# Patient Record
Sex: Female | Born: 1950
Health system: Southern US, Community
[De-identification: ages and names within clinical notes are randomized; demographics above are authoritative.]

## PROBLEM LIST (undated history)

## (undated) DIAGNOSIS — R519 Headache, unspecified: Secondary | ICD-10-CM

## (undated) DIAGNOSIS — R7303 Prediabetes: Secondary | ICD-10-CM

## (undated) DIAGNOSIS — H18609 Keratoconus, unspecified, unspecified eye: Secondary | ICD-10-CM

## (undated) DIAGNOSIS — J45909 Unspecified asthma, uncomplicated: Secondary | ICD-10-CM

## (undated) DIAGNOSIS — M1990 Unspecified osteoarthritis, unspecified site: Secondary | ICD-10-CM

## (undated) DIAGNOSIS — T7840XA Allergy, unspecified, initial encounter: Secondary | ICD-10-CM

## (undated) DIAGNOSIS — I10 Essential (primary) hypertension: Secondary | ICD-10-CM

## (undated) DIAGNOSIS — E785 Hyperlipidemia, unspecified: Secondary | ICD-10-CM

## (undated) DIAGNOSIS — H269 Unspecified cataract: Secondary | ICD-10-CM

## (undated) DIAGNOSIS — M810 Age-related osteoporosis without current pathological fracture: Secondary | ICD-10-CM

## (undated) HISTORY — DX: Age-related osteoporosis without current pathological fracture: M81.0

## (undated) HISTORY — PX: BREAST EXCISIONAL BIOPSY: SUR124

## (undated) HISTORY — PX: BACK SURGERY: SHX140

## (undated) HISTORY — DX: Hyperlipidemia, unspecified: E78.5

## (undated) HISTORY — DX: Unspecified cataract: H26.9

## (undated) HISTORY — PX: COLONOSCOPY W/ POLYPECTOMY: SHX1380

## (undated) HISTORY — DX: Allergy, unspecified, initial encounter: T78.40XA

## (undated) HISTORY — DX: Keratoconus, unspecified, unspecified eye: H18.609

## (undated) HISTORY — PX: EYE SURGERY: SHX253

## (undated) HISTORY — PX: OTHER SURGICAL HISTORY: SHX169

## (undated) HISTORY — DX: Essential (primary) hypertension: I10

## (undated) HISTORY — DX: Unspecified asthma, uncomplicated: J45.909

## (undated) HISTORY — PX: BREAST SURGERY: SHX581

## (undated) HISTORY — PX: CATARACT EXTRACTION: SUR2

---

## 1993-06-11 HISTORY — PX: ABDOMINAL HYSTERECTOMY: SHX81

## 1999-04-10 ENCOUNTER — Other Ambulatory Visit: Admission: RE | Admit: 1999-04-10 | Discharge: 1999-04-10 | Payer: Self-pay | Admitting: Obstetrics and Gynecology

## 2000-01-04 ENCOUNTER — Encounter: Admission: RE | Admit: 2000-01-04 | Discharge: 2000-01-04 | Payer: Self-pay | Admitting: General Surgery

## 2000-01-04 ENCOUNTER — Encounter: Payer: Self-pay | Admitting: General Surgery

## 2000-01-10 ENCOUNTER — Ambulatory Visit (HOSPITAL_COMMUNITY): Admission: RE | Admit: 2000-01-10 | Discharge: 2000-01-10 | Payer: Self-pay | Admitting: General Surgery

## 2000-01-10 ENCOUNTER — Encounter: Payer: Self-pay | Admitting: General Surgery

## 2000-01-31 ENCOUNTER — Ambulatory Visit (HOSPITAL_COMMUNITY): Admission: RE | Admit: 2000-01-31 | Discharge: 2000-01-31 | Payer: Self-pay | Admitting: Obstetrics and Gynecology

## 2000-01-31 ENCOUNTER — Encounter: Payer: Self-pay | Admitting: Obstetrics and Gynecology

## 2000-06-10 ENCOUNTER — Encounter: Payer: Self-pay | Admitting: Obstetrics and Gynecology

## 2000-06-10 ENCOUNTER — Encounter: Admission: RE | Admit: 2000-06-10 | Discharge: 2000-06-10 | Payer: Self-pay | Admitting: Obstetrics and Gynecology

## 2000-06-10 ENCOUNTER — Other Ambulatory Visit: Admission: RE | Admit: 2000-06-10 | Discharge: 2000-06-10 | Payer: Self-pay | Admitting: Obstetrics and Gynecology

## 2001-12-30 ENCOUNTER — Other Ambulatory Visit: Admission: RE | Admit: 2001-12-30 | Discharge: 2001-12-30 | Payer: Self-pay | Admitting: Obstetrics and Gynecology

## 2003-04-09 ENCOUNTER — Ambulatory Visit (HOSPITAL_BASED_OUTPATIENT_CLINIC_OR_DEPARTMENT_OTHER): Admission: RE | Admit: 2003-04-09 | Discharge: 2003-04-09 | Payer: Self-pay | Admitting: Podiatry

## 2003-04-09 ENCOUNTER — Ambulatory Visit (HOSPITAL_COMMUNITY): Admission: RE | Admit: 2003-04-09 | Discharge: 2003-04-09 | Payer: Self-pay | Admitting: Podiatry

## 2005-03-04 ENCOUNTER — Emergency Department (HOSPITAL_COMMUNITY): Admission: EM | Admit: 2005-03-04 | Discharge: 2005-03-04 | Payer: Self-pay | Admitting: Emergency Medicine

## 2005-03-05 ENCOUNTER — Emergency Department (HOSPITAL_COMMUNITY): Admission: EM | Admit: 2005-03-05 | Discharge: 2005-03-06 | Payer: Self-pay | Admitting: Emergency Medicine

## 2012-05-02 ENCOUNTER — Encounter (HOSPITAL_COMMUNITY): Payer: Self-pay | Admitting: *Deleted

## 2012-05-13 ENCOUNTER — Encounter (HOSPITAL_COMMUNITY): Payer: Self-pay

## 2012-05-13 ENCOUNTER — Ambulatory Visit (HOSPITAL_COMMUNITY)
Admission: RE | Admit: 2012-05-13 | Discharge: 2012-05-13 | Disposition: A | Discharge status: Home / Self Care | Payer: Self-pay | Source: Ambulatory Visit | Attending: Obstetrics and Gynecology | Admitting: Obstetrics and Gynecology

## 2012-05-13 VITALS — BP 124/76 | Temp 98.2°F | Ht 66.0 in | Wt 160.2 lb

## 2012-05-13 DIAGNOSIS — Z1239 Encounter for other screening for malignant neoplasm of breast: Secondary | ICD-10-CM

## 2012-05-13 NOTE — Progress Notes (Signed)
Complaints of left breast growth x 2 weeks.  Pap Smear:    Pap smear not performed today. Per patient last Pap smear was in 2008 and normal. Per patient she has no history of abnormal Pap smears. Patient has a history of a hysterectomy in 1995 for fibroids.  No Pap smear results in EPIC.  Physical exam: Breasts Breasts symmetrical. Multiple scars bilateral breasts from history of surgical removal of benign breast masses. No nipple retraction bilateral breasts. No nipple discharge bilateral breasts. No lymphadenopathy. No lumps palpated bilateral breasts. No complaints of pain or tenderness on exam. Observed small bump on left areola, which is patients area of concern. Patient referred to [facility] for diagnostic mammogram. Appointment scheduled for Monday, May 19, 2012 at 1400.         Pelvic/Bimanual No Pap smear completed today since patient has a history of a hysterectomy for benign reasons. Pap smear not indicated per BCCCP guidelines.

## 2012-05-13 NOTE — Patient Instructions (Signed)
Taught patient how to perform BSE and gave educational materials to take home. Patient did not need a Pap smear today due to history of a hysterectomy for benign reasons. Let patient know that she will not need any further Pap smears.  Patient referred to [facility] for diagnostic mammogram. Appointment scheduled for Monday, May 19, 2012 at 1400. Patient aware of appointment and will be there. Patient verbalized understanding.

## 2012-06-12 NOTE — Addendum Note (Signed)
Encounter addended by: Saintclair Halsted, RN on: 06/12/2012  4:47 PM<BR>     Documentation filed: Visit Diagnoses, Charges VN

## 2012-09-08 ENCOUNTER — Emergency Department (HOSPITAL_COMMUNITY)
Admission: EM | Admit: 2012-09-08 | Discharge: 2012-09-08 | Disposition: A | Discharge status: Home / Self Care | Payer: No Typology Code available for payment source | Source: Home / Self Care

## 2012-09-08 ENCOUNTER — Encounter (HOSPITAL_COMMUNITY): Payer: Self-pay

## 2012-09-08 DIAGNOSIS — L0291 Cutaneous abscess, unspecified: Secondary | ICD-10-CM

## 2012-09-08 DIAGNOSIS — L0390 Cellulitis, unspecified: Secondary | ICD-10-CM

## 2012-09-08 MED ORDER — DOXYCYCLINE HYCLATE 50 MG PO CAPS: 50.0000 mg | ORAL_CAPSULE | Freq: Two times a day (BID) | ORAL | Status: DC

## 2012-09-08 NOTE — ED Notes (Signed)
Patient here to establish care Needs referral to dental

## 2012-09-08 NOTE — ED Provider Notes (Signed)
History     CSN: [id number]  Arrival date & time 09/08/12  1318   First MD Initiated Contact with Patient 09/08/12 1331      Chief Complaint  Patient presents with  . Establish Care    (Consider location/radiation/quality/duration/timing/severity/associated sxs/prior treatment) HPI Patient is 62 year old female who presents for regular followup appointment. She explains she has a small abscess formed above left upper molar and would like referral to dentist. She denies chest pain or shortness of breath, no recent sicknesses or hospitalizations, no specific abdominal or urinary concerns. She would also like a referral to GI specialist as she has not had a colonoscopy done.  History reviewed. No pertinent past medical history.  Past Surgical History  Procedure Laterality Date  . Breast surgery      breast biopies  . Abdominal hysterectomy      Family History  Problem Relation Age of Onset  . Hypertension Sister   . Heart disease Sister     History  Substance Use Topics  . Smoking status: Never Smoker   . Smokeless tobacco: Never Used  . Alcohol Use: No    OB History   Grav Para Term Preterm Abortions TAB SAB Ect Mult Living   0         0      Review of Systems  Constitutional: Negative for fever, chills, diaphoresis, activity change, appetite change and fatigue.  HENT: Negative for ear pain, nosebleeds, congestion, facial swelling, rhinorrhea, neck pain, neck stiffness and ear discharge.   Eyes: Negative for pain, discharge, redness, itching and visual disturbance.  Respiratory: Negative for cough, choking, chest tightness, shortness of breath, wheezing and stridor.   Cardiovascular: Negative for chest pain, palpitations and leg swelling.  Gastrointestinal: Negative for abdominal distention.  Genitourinary: Negative for dysuria, urgency, frequency, hematuria, flank pain, decreased urine volume, difficulty urinating and dyspareunia.  Musculoskeletal: Negative for  back pain, joint swelling, arthralgias and gait problem.  Neurological: Negative for dizziness, tremors, seizures, syncope, facial asymmetry, speech difficulty, weakness, light-headedness, numbness and headaches.  Hematological: Negative for adenopathy. Does not bruise/bleed easily.  Psychiatric/Behavioral: Negative for hallucinations, behavioral problems, confusion, dysphoric mood, decreased concentration and agitation.    Allergies  Penicillins and Valium  Home Medications   Current Outpatient Rx  Name  Route  Sig  Dispense  Refill  . doxycycline (VIBRAMYCIN) 50 MG capsule   Oral   Take 1 capsule (50 mg total) by mouth 2 (two) times daily.   14 capsule   0     BP 135/73  Pulse 84  Temp(Src) 97.7 F (36.5 C) (Oral)  SpO2 100%  Physical Exam Physical Exam  Constitutional: Appears well-developed and well-nourished. No distress.  HENT: Normocephalic. External right and left ear normal. Oropharynx is clear and moist.  small abscess half a centimeter in size located above left upper molar, no pus noted, tender to palpation Eyes: Conjunctivae and EOM are normal. PERRLA, no scleral icterus.  Neck: Normal ROM. Neck supple. No JVD. No tracheal deviation. No thyromegaly.  CVS: RRR, S1/S2 +, no murmurs, no gallops, no carotid bruit.  Pulmonary: Effort and breath sounds normal, no stridor, rhonchi, wheezes, rales.  Abdominal: Soft. BS +,  no distension, tenderness, rebound or guarding.  Musculoskeletal: Normal range of motion. No edema and no tenderness.  Lymphadenopathy: No lymphadenopathy noted, cervical, inguinal. Neuro: Alert. Normal reflexes, muscle tone coordination. No cranial nerve deficit. Skin: Skin is warm and dry. No rash noted. Not diaphoretic. No erythema. No pallor.  Psychiatric: Normal mood and affect. Behavior, judgment, thought content normal.    ED Course  Procedures (including critical care time)  Labs Reviewed - No data to display No results found.   1.  Abscess   - will provide referral for dentist, also provide doxycycline for 7 days, apply warm compresses daily 2. GI referral for colonoscopy    MDM  Tooth abscess, doxycycline for 7 days and dentist referral        Dorothea Ogle, MD 09/08/12 [phone]

## 2012-09-08 NOTE — ED Notes (Signed)
Referral faxed to [facility] needs routine colonoscopy appt 10/24/12 @ 10:30

## 2012-09-09 ENCOUNTER — Encounter: Payer: Self-pay | Admitting: Gastroenterology

## 2012-09-15 NOTE — ED Notes (Signed)
Patient has an appointment 4/10 @ [facility]

## 2012-10-09 ENCOUNTER — Ambulatory Visit (AMBULATORY_SURGERY_CENTER): Payer: No Typology Code available for payment source | Admitting: *Deleted

## 2012-10-09 VITALS — Ht 66.5 in | Wt 155.6 lb

## 2012-10-09 DIAGNOSIS — Z1211 Encounter for screening for malignant neoplasm of colon: Secondary | ICD-10-CM

## 2012-10-09 MED ORDER — NA SULFATE-K SULFATE-MG SULF 17.5-3.13-1.6 GM/177ML PO SOLN: 1.0000 | Freq: Once | ORAL | Status: DC

## 2012-10-09 NOTE — Progress Notes (Signed)
Vegetarian x 25 years. ewm No egg or soy allergy. ewm No problems with sedation in the past. ewm

## 2012-10-23 ENCOUNTER — Encounter: Payer: Self-pay | Admitting: Gastroenterology

## 2012-10-23 ENCOUNTER — Ambulatory Visit (AMBULATORY_SURGERY_CENTER): Payer: No Typology Code available for payment source | Admitting: Gastroenterology

## 2012-10-23 VITALS — BP 133/69 | HR 64 | Temp 96.7°F | Resp 12 | Ht 66.0 in | Wt 155.0 lb

## 2012-10-23 DIAGNOSIS — D126 Benign neoplasm of colon, unspecified: Secondary | ICD-10-CM

## 2012-10-23 DIAGNOSIS — Z1211 Encounter for screening for malignant neoplasm of colon: Secondary | ICD-10-CM

## 2012-10-23 MED ORDER — SODIUM CHLORIDE 0.9 % IV SOLN: 500.0000 mL | INTRAVENOUS | Status: DC

## 2012-10-23 NOTE — Op Note (Signed)
[facility] [address] [place], [address]   COLONOSCOPY PROCEDURE REPORT  PATIENT: Nicole, Delgado  MR#: [id number] BIRTHDATE: Mar 14, 1951 , 62  yrs. old GENDER: Female ENDOSCOPIST: Louis Meckel, MD REFERRED BY: PROCEDURE DATE:  10/23/2012 PROCEDURE:   Colonoscopy with snare polypectomy ASA CLASS:   Class I INDICATIONS:average risk screening. MEDICATIONS: MAC sedation, administered by CRNA and propofol (Diprivan) 200mg  IV  DESCRIPTION OF PROCEDURE:   After the risks benefits and alternatives of the procedure were thoroughly explained, informed consent was obtained.  A digital rectal exam revealed no abnormalities of the rectum.   The LB [id number]  endoscope was introduced through the anus and advanced to the cecum, which was identified by both the appendix and ileocecal valve. No adverse events experienced.   The quality of the prep was excellent using Suprep  The instrument was then slowly withdrawn as the colon was fully examined.      COLON FINDINGS: A sessile polyp was found in the rectum.  A polypectomy was performed with a cold snare.  The resection was complete and the polyp tissue was completely retrieved.   The colon mucosa was otherwise normal.  Retroflexed views revealed no abnormalities. The time to cecum=3 minutes 07 seconds.  Withdrawal time=7 minutes 28 seconds.  The scope was withdrawn and the procedure completed. COMPLICATIONS: There were no complications.  ENDOSCOPIC IMPRESSION: 1.   Sessile polyp was found in the rectum; polypectomy was performed with a cold snare 2.   The colon mucosa was otherwise normal  RECOMMENDATIONS: If the polyp(s) removed today are proven to be adenomatous (pre-cancerous) polyps, you will need a repeat colonoscopy in 5 years.  Otherwise you should continue to follow colorectal cancer screening guidelines for "routine risk" patients with colonoscopy in 10 years.  You will receive a letter  within 1-2 weeks with the results of your biopsy as well as final recommendations.  Please call my office if you have not received a letter after 3 weeks.   eSigned:  Louis Meckel, MD 10/23/2012 10:36 AM   cc:

## 2012-10-23 NOTE — Patient Instructions (Addendum)
YOU HAD AN ENDOSCOPIC PROCEDURE TODAY AT THE [facility]: Refer to the procedure report that was given to you for any specific questions about what was found during the examination.  If the procedure report does not answer your questions, please call your gastroenterologist to clarify.  If you requested that your care partner not be given the details of your procedure findings, then the procedure report has been included in a sealed envelope for you to review at your convenience later.  YOU SHOULD EXPECT: Some feelings of bloating in the abdomen. Passage of more gas than usual.  Walking can help get rid of the air that was put into your GI tract during the procedure and reduce the bloating. If you had a lower endoscopy (such as a colonoscopy or flexible sigmoidoscopy) you may notice spotting of blood in your stool or on the toilet paper. If you underwent a bowel prep for your procedure, then you may not have a normal bowel movement for a few days.  DIET: Your first meal following the procedure should be a light meal and then it is ok to progress to your normal diet.  A half-sandwich or bowl of soup is an example of a good first meal.  Heavy or fried foods are harder to digest and may make you feel nauseous or bloated.  Likewise meals heavy in dairy and vegetables can cause extra gas to form and this can also increase the bloating.  Drink plenty of fluids but you should avoid alcoholic beverages for 24 hours.  ACTIVITY: Your care partner should take you home directly after the procedure.  You should plan to take it easy, moving slowly for the rest of the day.  You can resume normal activity the day after the procedure however you should NOT DRIVE or use heavy machinery for 24 hours (because of the sedation medicines used during the test).    SYMPTOMS TO REPORT IMMEDIATELY: A gastroenterologist can be reached at any hour.  During normal business hours, 8:30 AM to 5:00 PM Monday through Friday,  call [phone].  After hours and on weekends, please call the GI answering service at [phone] who will take a message and have the physician on call con   Following lower endoscopy (colonoscopy or flexible sigmoidoscopy):  Excessive amounts of blood in the stool  Significant tenderness or worsening of abdominal pains  Swelling of the abdomen that is new, acute  Fever of 100F or higher  ls  FOLLOW UP: If any biopsies were taken you will be contacted by phone or by letter within the next 1-3 weeks.  Call your gastroenterologist if you have not heard about the biopsies in 3 weeks.  Our staff will call the home number listed on your records the next business day following your procedure to check on you and address any questions or concerns that you may have at that time regarding the information given to you following your procedure. This is a courtesy call and so if there is no answer at the home number and we have not heard from you through the emergency physician on call, we will assume that you have returned to your regular daily activities without incident.  SIGNATURES/CONFIDENTIALITY: You and/or your care partner have signed paperwork which will be entered into your electronic medical record.  These signatures attest to the fact that that the information above on your After Visit Summary has been reviewed and is understood.  Full responsibility of the confidentiality of  this discharge information lies with you and/or your care-partner.   Polyp information given.  Dr. Arlyce Dice will advise you about the timing of next colonoscopy after results are reviewed.

## 2012-10-23 NOTE — Progress Notes (Signed)
Patient did not experience any of the following events: a burn prior to discharge; a fall within the facility; wrong site/side/patient/procedure/implant event; or a hospital transfer or hospital admission upon discharge from the facility. (G8907) Patient did not have preoperative order for IV antibiotic SSI prophylaxis. (G8918)  

## 2012-10-24 ENCOUNTER — Telehealth: Payer: Self-pay | Admitting: *Deleted

## 2012-10-24 NOTE — Telephone Encounter (Signed)
  Follow up Call-  Call back number 10/23/2012  Post procedure Call Back phone  # [phone]  Permission to leave phone message Yes     Patient questions:  Do you have a fever, pain , or abdominal swelling? no Pain Score  0 *  Have you tolerated food without any problems? yes  Have you been able to return to your normal activities? yes  Do you have any questions about your discharge instructions: Diet   no Medications  no Follow up visit  no  Do you have questions or concerns about your Care? no  Actions: * If pain score is 4 or above: No action needed, pain <4.

## 2012-11-04 ENCOUNTER — Encounter: Payer: Self-pay | Admitting: Gastroenterology

## 2012-11-16 ENCOUNTER — Ambulatory Visit: Payer: Self-pay | Admitting: Physician Assistant

## 2012-11-16 VITALS — BP 129/73 | HR 82 | Temp 98.2°F | Resp 16 | Ht 65.5 in | Wt 155.0 lb

## 2012-11-16 DIAGNOSIS — N390 Urinary tract infection, site not specified: Secondary | ICD-10-CM

## 2012-11-16 DIAGNOSIS — R300 Dysuria: Secondary | ICD-10-CM

## 2012-11-16 LAB — POCT URINALYSIS DIPSTICK
Bilirubin, UA: NEGATIVE
Glucose, UA: NEGATIVE
Ketones, UA: NEGATIVE
Nitrite, UA: NEGATIVE
Protein, UA: NEGATIVE
Spec Grav, UA: 1.015
Urobilinogen, UA: 0.2
pH, UA: 5.5

## 2012-11-16 LAB — POCT UA - MICROSCOPIC ONLY
Casts, Ur, LPF, POC: NEGATIVE
Crystals, Ur, HPF, POC: NEGATIVE
Epithelial cells, urine per micros: NEGATIVE
Mucus, UA: NEGATIVE
Yeast, UA: NEGATIVE

## 2012-11-16 MED ORDER — CIPROFLOXACIN HCL 500 MG PO TABS: 500.0000 mg | ORAL_TABLET | Freq: Two times a day (BID) | ORAL | Status: DC

## 2012-11-16 MED ORDER — PHENAZOPYRIDINE HCL 200 MG PO TABS: 200.0000 mg | ORAL_TABLET | Freq: Three times a day (TID) | ORAL | Status: DC | PRN

## 2012-11-16 NOTE — Progress Notes (Signed)
Patient ID: Nicole Delgado MRN: [id number], DOB: 1950/12/17, 62 y.o. Date of Encounter: 11/16/2012, 6:30 PM  Primary Physician: No primary provider on file.  Chief Complaint: urinary frequency and dysuria  HPI: 62 y.o. year old female with presents with 3 day history of urinary frequency, dysuria, and feeling as if she is "peeing razor blades." Denies flank pain, nausea, vomiting, fever, chills, vaginal discharge, or odor. Has tried drinking cranberry juice which does seem to help slightly. Patient is postmenopausal. She is otherwise very health. Recently ran her first half marathon. 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   Patient is otherwise doing well without issues or complaints.  Past Medical History  Diagnosis Date  . Allergy      Home Meds: Prior to Admission medications   Medication Sig Start Date End Date Taking? Authorizing Provider  GLUCOSAMINE-CHONDROITIN PO Take 1 tablet by mouth 2 (two) times daily with a meal.   Yes Historical Provider, MD  ciprofloxacin (CIPRO) 500 MG tablet Take 1 tablet (500 mg total) by mouth 2 (two) times daily. 11/16/12   Melany Wiesman Jaquita Rector, PA-C  phenazopyridine (PYRIDIUM) 200 MG tablet Take 1 tablet (200 mg total) by mouth 3 (three) times daily as needed for pain. 11/16/12   Nelva Nay, PA-C    Allergies:  Allergies  Allergen Reactions  . Bee Venom Swelling  . Penicillins Hives  . Valium (Diazepam) Other (See Comments)    Hallucinations    History   Social History  . Marital Status: Divorced    Spouse Name: N/A    Number of Children: N/A  . Years of Education: N/A   Occupational History  . Not on file.   Social History Main Topics  . Smoking status: Never Smoker   . Smokeless tobacco: Never Used  . Alcohol Use: No  . Drug Use: No  . Sexually Active: Yes    Birth Control/ Protection: Surgical   Other Topics Concern  . Not on file   Social History Narrative  . No narrative on file     Review of Systems: Constitutional: negative  for chills, fever, night sweats, weight changes, or fatigue  HEENT: negative for vision changes, hearing loss, congestion, rhinorrhea, ST, epistaxis, or sinus pressure Cardiovascular: negative for chest pain or palpitations Respiratory: negative for cough, hemoptysis, wheezing, shortness of breath. Abdominal: negative for suprapubic abdominal pain. Positive for dysuria and frequency.   No nausea, vomiting, diarrhea, or constipation Genitourinary: positive for urinary frequency and dysuria. Negative for vaginal discharge, odor, or pelvic pain.  Dermatological: negative for rashes. Neurologic: negative for headache, dizziness, or syncope   Physical Exam: Blood pressure 129/73, pulse 82, temperature 98.2 F (36.8 C), temperature source Oral, resp. rate 16, height 5' 5.5" (1.664 m), weight 155 lb (70.308 kg), SpO2 98.00%., Body mass index is 25.39 kg/(m^2). General: Well developed, well nourished, in no acute distress. Head: Normocephalic, atraumatic, eyes without discharge, sclera non-icteric, nares are without discharge. External ear normal in appearance. Neck: Supple. No thyromegaly. Full ROM. No lymphadenopathy. Lungs: Clear bilaterally to auscultation without wheezes, rales, or rhonchi. Breathing is unlabored. Heart: RRR with S1 S2. No murmurs, rubs, or gallops appreciated. Abdominal: +BS x 4. No hepatosplenomegaly, rebound tenderness, or guarding. No suprapubic tenderness or CVA tenderness bilaterally.  Msk:  Strength and tone normal for age. Extremities/Skin: Warm and dry. No clubbing or cyanosis. No edema.  Neuro: Alert and oriented X 3. Moves all extremities spontaneously. Gait is normal. CNII-XII grossly in tact. Psych:  Responds to questions appropriately with a normal affect.   Labs: Results for orders placed in visit on 11/16/12  POCT URINALYSIS DIPSTICK      Result Value Range   Color, UA yellow     Clarity, UA cloudy     Glucose, UA neg     Bilirubin, UA neg     Ketones, UA  neg     Spec Grav, UA 1.015     Blood, UA large     pH, UA 5.5     Protein, UA neg     Urobilinogen, UA 0.2     Nitrite, UA neg     Leukocytes, UA large (3+)    POCT UA - MICROSCOPIC ONLY      Result Value Range   WBC, Ur, HPF, POC tntc     RBC, urine, microscopic 0-1     Bacteria, U Microscopic 1+     Mucus, UA neg     Epithelial cells, urine per micros neg     Crystals, Ur, HPF, POC neg     Casts, Ur, LPF, POC neg     Yeast, UA neg       ASSESSMENT AND PLAN:  62 y.o. year old female with urinary tract infection Urine culture sent Increase fluids Cipro 500 mg bid x 5 days Pyridium 200 mg tid as needed for symptomatic relief Follow up if symptoms worsen or fail to improve.  Grier Mitts, PA-C 11/16/2012 6:30 PM

## 2012-11-18 LAB — URINE CULTURE
Colony Count: NO GROWTH
Organism ID, Bacteria: NO GROWTH

## 2012-12-12 ENCOUNTER — Ambulatory Visit: Payer: Self-pay | Admitting: Internal Medicine

## 2012-12-12 VITALS — BP 115/68 | HR 93 | Temp 98.5°F | Resp 16 | Ht 66.5 in | Wt 156.0 lb

## 2012-12-12 DIAGNOSIS — W57XXXA Bitten or stung by nonvenomous insect and other nonvenomous arthropods, initial encounter: Secondary | ICD-10-CM

## 2012-12-12 MED ORDER — PREDNISONE 20 MG PO TABS: ORAL_TABLET | ORAL | Status: DC

## 2012-12-12 MED ORDER — CETIRIZINE HCL 10 MG PO TABS: 10.0000 mg | ORAL_TABLET | Freq: Every day | ORAL | Status: DC

## 2012-12-13 NOTE — Progress Notes (Signed)
  Subjective:    Patient ID: Nicole Delgado, female    DOB: 1951-06-02, 62 y.o.   MRN: [id number]  HPI stone on the arm by yellow jackets about 8 hours ago Now has itching discomfort and redness Had an abrupt hypersensitivity response about 1 year ago to an insect sting with symptoms within 20 minutes No respiratory symptoms   Review of Systems     Objective:   Physical Exam BP 115/68  Pulse 93  Temp(Src) 98.5 F (36.9 C)  Resp 16  Ht 5' 6.5" (1.689 m)  Wt 156 lb (70.761 kg)  BMI 24.8 kg/m2 On the dorsal aspect of her left forearm there is a 6 cm x 5 cm red indurated area with a central punctum/no hemorrhagic areas/no vesiculation  There is no rash on the rest of her body She has no respiratory symptoms or angioedema       Assessment & Plan:  Mild allergic reaction to insect staying Meds ordered this encounter  Medications  . predniSONE (DELTASONE) 20 MG tablet    Sig: 3/3/2/2/1/1/ single daily dose for 6 days    Dispense:  12 tablet    Refill:  0  . cetirizine (ZYRTEC) 10 MG tablet    Sig: Take 1 tablet (10 mg total) by mouth daily.    Dispense:  7 tablet    Refill:  0

## 2012-12-15 ENCOUNTER — Other Ambulatory Visit: Payer: Self-pay | Admitting: Family Medicine

## 2012-12-15 ENCOUNTER — Telehealth: Payer: Self-pay | Admitting: Family Medicine

## 2012-12-15 DIAGNOSIS — K029 Dental caries, unspecified: Secondary | ICD-10-CM

## 2012-12-15 NOTE — Telephone Encounter (Signed)
Pt. Was seen at [facility] by Dr. Izola Price. The pt was given a dental referral but she could not keep her appt. The pt would like a new dental referral.

## 2013-01-01 ENCOUNTER — Telehealth: Payer: Self-pay | Admitting: Family Medicine

## 2013-01-01 NOTE — Telephone Encounter (Signed)
Pt was told for referral appt for GI she would only need to pay deductible of $100 which she did. Now she is getting Dr bills and does not understand why because she has orange card.  Pt also has not heard anything about two other referrals one of which is a Dental referral.  Please f/u with pt.

## 2013-01-05 NOTE — Telephone Encounter (Signed)
Lvm to pt about her gi referral bill and leave her a phone number for the dental .

## 2013-02-25 ENCOUNTER — Ambulatory Visit: Payer: No Typology Code available for payment source

## 2013-03-27 ENCOUNTER — Ambulatory Visit: Payer: No Typology Code available for payment source | Attending: Internal Medicine

## 2013-04-14 ENCOUNTER — Ambulatory Visit: Payer: No Typology Code available for payment source | Attending: Internal Medicine

## 2013-04-14 MED ORDER — ALBUTEROL SULFATE HFA 108 (90 BASE) MCG/ACT IN AERS: 2.0000 | INHALATION_SPRAY | Freq: Four times a day (QID) | RESPIRATORY_TRACT | Status: DC | PRN

## 2013-04-14 NOTE — Progress Notes (Unsigned)
  Subjective:    Patient ID: Nicole Delgado, female    DOB: Oct 16, 1950, 62 y.o.   MRN: [id number]  HPI    Review of Systems     Objective:   Physical Exam        Assessment & Plan:  Pt here to establish care but due to late arrival, pt unable to be seen by provider. Pt requesting albuterol inhaler for asthmatic bronchitis. Lungs clear all lobes with sats 100% r/a.vss Ordered inhaler until rescheduled appt

## 2013-04-20 ENCOUNTER — Encounter: Payer: Self-pay | Admitting: Internal Medicine

## 2013-04-20 ENCOUNTER — Ambulatory Visit: Payer: No Typology Code available for payment source | Attending: Internal Medicine | Admitting: Internal Medicine

## 2013-04-20 VITALS — BP 130/86 | HR 70 | Temp 99.0°F | Resp 16

## 2013-04-20 DIAGNOSIS — G8929 Other chronic pain: Secondary | ICD-10-CM | POA: Insufficient documentation

## 2013-04-20 DIAGNOSIS — D134 Benign neoplasm of liver: Secondary | ICD-10-CM | POA: Insufficient documentation

## 2013-04-20 DIAGNOSIS — G4700 Insomnia, unspecified: Secondary | ICD-10-CM | POA: Insufficient documentation

## 2013-04-20 DIAGNOSIS — M25519 Pain in unspecified shoulder: Secondary | ICD-10-CM | POA: Insufficient documentation

## 2013-04-20 DIAGNOSIS — B1920 Unspecified viral hepatitis C without hepatic coma: Secondary | ICD-10-CM

## 2013-04-20 MED ORDER — ZOLPIDEM TARTRATE 5 MG PO TABS: 5.0000 mg | ORAL_TABLET | Freq: Every evening | ORAL | Status: DC | PRN

## 2013-04-20 NOTE — Progress Notes (Signed)
Patient states she has trouble sleeping OTC tylenol PM does not work Has pain to right shoulder

## 2013-04-20 NOTE — Progress Notes (Signed)
Patient ID: Nicole Delgado, female   DOB: Feb 15, 1951, 62 y.o.   MRN: [id number] Patient Demographics  Nicole Delgado, is a 61 y.o. female  JYN:[id number]  QMV:[id number]  DOB - 08-31-1950  Chief Complaint  Patient presents with  . Sleeping Problem  . Shoulder Pain        Subjective:   Nicole Delgado is a 62 y.o. female here today for a follow up visit. Patient's major complaint today is insomnia. She also has this right shoulder pain that has been on and off for years but getting worse lately. Remotely she had some spots in her liver from MRI in 2001, she did not know what it was but on review of her medical record, was hepatic adenomas. She has no abdominal pain. No nausea or vomiting. Had recent colonoscopy, normal as well as mammogram. She thinks her lack of sleep might be due to menopause. She does not smoke cigarette, she does not drink alcohol. She's not on any chronic medication. She was told sometimes in the past that she has hepatitis but does not know which one it was, wants to know now. Patient has No headache, No chest pain, No abdominal pain - No Nausea, No new weakness tingling or numbness, No Cough - SOB.  ALLERGIES: Allergies  Allergen Reactions  . Bee Venom Swelling  . Penicillins Hives  . Valium [Diazepam] Other (See Comments)    Hallucinations    PAST MEDICAL HISTORY: Past Medical History  Diagnosis Date  . Allergy     MEDICATIONS AT HOME: Prior to Admission medications   Medication Sig Start Date End Date Taking? Authorizing Provider  albuterol (PROVENTIL HFA;VENTOLIN HFA) 108 (90 BASE) MCG/ACT inhaler Inhale 2 puffs into the lungs every 6 (six) hours as needed for wheezing or shortness of breath. 04/14/13   Jeanann Lewandowsky, MD  cetirizine (ZYRTEC) 10 MG tablet Take 1 tablet (10 mg total) by mouth daily. 12/12/12   Tonye Pearson, MD  GLUCOSAMINE-CHONDROITIN PO Take 1 tablet by mouth 2 (two) times daily with a meal.    Historical Provider, MD  predniSONE  (DELTASONE) 20 MG tablet 3/3/2/2/1/1/ single daily dose for 6 days 12/12/12   Tonye Pearson, MD  zolpidem (AMBIEN) 5 MG tablet Take 1 tablet (5 mg total) by mouth at bedtime as needed for sleep. 04/20/13   Jeanann Lewandowsky, MD     Objective:   Filed Vitals:   04/20/13 1533  BP: 130/86  Pulse: 70  Temp: 99 F (37.2 C)  Resp: 16    Exam General appearance : Awake, alert, not in any distress. Speech Clear. Not toxic looking HEENT: Atraumatic and Normocephalic, pupils equally reactive to light and accomodation Neck: supple, no JVD. No cervical lymphadenopathy.  Chest:Good air entry bilaterally, no added sounds  CVS: S1 S2 regular, no murmurs.  Abdomen: Bowel sounds present, Non tender and not distended with no gaurding, rigidity or rebound. Extremities: B/L Lower Ext shows no edema, both legs are warm to touch Neurology: Awake alert, and oriented X 3, CN II-XII intact, Non focal Skin:No Rash Wounds:N/A   Data Review   CBC No results found for this basename: WBC, HGB, HCT, PLT, MCV, MCH, MCHC, RDW, NEUTRABS, LYMPHSABS, MONOABS, EOSABS, BASOSABS, BANDABS, BANDSABD,  in the last 168 hours  Chemistries   No results found for this basename: NA, K, CL, CO2, GLUCOSE, BUN, CREATININE, GFRCGP, CALCIUM, MG, AST, ALT, ALKPHOS, BILITOT,  in the last 168 hours ------------------------------------------------------------------------------------------------------------------ No results found for this basename: HGBA1C,  in the last 72 hours ------------------------------------------------------------------------------------------------------------------ No results found for this basename: CHOL, HDL, LDLCALC, TRIG, CHOLHDL, LDLDIRECT,  in the last 72 hours ------------------------------------------------------------------------------------------------------------------ No results found for this basename: TSH, T4TOTAL, FREET3, T3FREE, THYROIDAB,  in the last 72  hours ------------------------------------------------------------------------------------------------------------------ No results found for this basename: VITAMINB12, FOLATE, FERRITIN, TIBC, IRON, RETICCTPCT,  in the last 72 hours  Coagulation profile  No results found for this basename: INR, PROTIME,  in the last 168 hours    Assessment & Plan   Patient Active Problem List   Diagnosis Date Noted  . Hepatitis C 04/20/2013  . Hepatic adenoma 04/20/2013  . Chronic right shoulder pain 04/20/2013  . Insomnia 04/20/2013     Plan:  Insomnia: Ambien 5 mg tablet by mouth each bedtime when necessary insomnia Patient counseled extensively about sleep hygiene  History of Hepatic adenoma Liver ultrasound X-ray of the right shoulder  Comprehensive metabolic panel TSH Hepatitis panel  Patient counseled about nutrition and exercise  Follow up in 4 weeks or when necessary   The patient was given clear instructions to go to ER or return to medical center if symptoms don't improve, worsen or new problems develop. The patient verbalized understanding. The patient was told to call to get lab results if they haven't heard anything in the next week.    Jeanann Lewandowsky, MD, MHA, FACP, FAAP [facility] [place], [place] [phone]   04/20/2013, 4:18 PM

## 2013-04-20 NOTE — Patient Instructions (Signed)

## 2013-04-21 ENCOUNTER — Telehealth: Payer: Self-pay | Admitting: Emergency Medicine

## 2013-04-21 LAB — CMP AND LIVER
ALT: 14 U/L (ref 0–35)
Bilirubin, Direct: <0.1 mg/dL (ref 0.0–0.3)
CO2: 29 mEq/L (ref 19–32)
Calcium: 9.7 mg/dL (ref 8.4–10.5)
Chloride: 102 mEq/L (ref 96–112)
Sodium: 139 mEq/L (ref 135–145)
Total Protein: 6.7 g/dL (ref 6.0–8.3)

## 2013-04-21 LAB — HEPATITIS PANEL, ACUTE: Hep A IgM: NONREACTIVE

## 2013-04-21 NOTE — Telephone Encounter (Signed)
Pt given negative test results 

## 2013-04-21 NOTE — Telephone Encounter (Signed)
Message copied by Darlis Loan on Tue Apr 21, 2013  5:28 PM ------      Message from: Jeanann Lewandowsky E      Created: Tue Apr 21, 2013  5:00 PM       Please call to inform patient that her hepatitis panel is negative, she does not have hepatitis C, A or B. her liver and kidney functions are normal as well as her thyroid function ------

## 2013-04-22 ENCOUNTER — Other Ambulatory Visit: Payer: Self-pay | Admitting: Internal Medicine

## 2013-04-22 ENCOUNTER — Ambulatory Visit (HOSPITAL_COMMUNITY)
Admission: RE | Admit: 2013-04-22 | Discharge: 2013-04-22 | Disposition: A | Discharge status: Home / Self Care | Payer: No Typology Code available for payment source | Source: Ambulatory Visit | Attending: Internal Medicine | Admitting: Internal Medicine

## 2013-04-22 DIAGNOSIS — G8929 Other chronic pain: Secondary | ICD-10-CM

## 2013-04-22 DIAGNOSIS — G4700 Insomnia, unspecified: Secondary | ICD-10-CM

## 2013-04-22 DIAGNOSIS — B1920 Unspecified viral hepatitis C without hepatic coma: Secondary | ICD-10-CM | POA: Insufficient documentation

## 2013-04-22 DIAGNOSIS — D134 Benign neoplasm of liver: Secondary | ICD-10-CM

## 2013-04-22 DIAGNOSIS — K7689 Other specified diseases of liver: Secondary | ICD-10-CM | POA: Insufficient documentation

## 2013-04-22 DIAGNOSIS — M25519 Pain in unspecified shoulder: Secondary | ICD-10-CM | POA: Insufficient documentation

## 2013-04-24 ENCOUNTER — Telehealth: Payer: Self-pay | Admitting: Emergency Medicine

## 2013-04-24 NOTE — Progress Notes (Signed)
Pt given test results and scheduled appt for f/u  r shoulder pain 05/18/13

## 2013-04-24 NOTE — Telephone Encounter (Signed)
Message copied by Darlis Loan on Fri Apr 24, 2013  2:20 PM ------      Message from: Jeanann Lewandowsky E      Created: Fri Apr 24, 2013 11:06 AM       Kindly inform patient that the x-ray of her right shoulder is negative for fracture or dislocation and no evidence of arthritis ------

## 2013-04-30 ENCOUNTER — Encounter: Payer: Self-pay | Admitting: Internal Medicine

## 2013-04-30 ENCOUNTER — Ambulatory Visit: Payer: No Typology Code available for payment source | Attending: Internal Medicine | Admitting: Internal Medicine

## 2013-04-30 VITALS — BP 165/87 | HR 81 | Temp 98.4°F | Resp 18 | Ht 66.5 in | Wt 157.0 lb

## 2013-04-30 DIAGNOSIS — M542 Cervicalgia: Secondary | ICD-10-CM

## 2013-04-30 MED ORDER — GABAPENTIN 100 MG PO CAPS: 200.0000 mg | ORAL_CAPSULE | Freq: Three times a day (TID) | ORAL | Status: DC

## 2013-04-30 MED ORDER — DICLOFENAC SODIUM 75 MG PO TBEC: 75.0000 mg | DELAYED_RELEASE_TABLET | Freq: Two times a day (BID) | ORAL | Status: DC

## 2013-04-30 NOTE — Progress Notes (Unsigned)
Patient ID: Nicole Delgado, female   DOB: Nov 01, 1950, 61 y.o.   MRN: [id number]   CC:  HPI: 62 year old female who is here for right shoulder pain. The patient's pain started in September after a bee sting. The patient states that the pain originates along the right side of her neck and radiates into her right shoulder. She denies any skin rashes that she has noticed. She has difficulty falling asleep at night and was prescribed Ambien during her last visit States that she has been taking Aleve every 2 hours for the pain She denies any numbness and tingling in her hands or forearm The pain is very localized to her right shoulder and right side of her neck   Allergies  Allergen Reactions  . Bee Venom Swelling  . Penicillins Hives  . Valium [Diazepam] Other (See Comments)    Hallucinations   Past Medical History  Diagnosis Date  . Allergy    Current Outpatient Prescriptions on File Prior to Visit  Medication Sig Dispense Refill  . albuterol (PROVENTIL HFA;VENTOLIN HFA) 108 (90 BASE) MCG/ACT inhaler Inhale 2 puffs into the lungs every 6 (six) hours as needed for wheezing or shortness of breath.  1 Inhaler  0  . cetirizine (ZYRTEC) 10 MG tablet Take 1 tablet (10 mg total) by mouth daily.  7 tablet  0  . GLUCOSAMINE-CHONDROITIN PO Take 1 tablet by mouth 2 (two) times daily with a meal.      . predniSONE (DELTASONE) 20 MG tablet 3/3/2/2/1/1/ single daily dose for 6 days  12 tablet  0   No current facility-administered medications on file prior to visit.   Family History  Problem Relation Age of Onset  . Hypertension Sister   . Heart disease Sister   . Colon cancer Neg Hx   . Alzheimer's disease Mother   . Heart Problems Father    History   Social History  . Marital Status: Divorced    Spouse Name: N/A    Number of Children: N/A  . Years of Education: N/A   Occupational History  . Not on file.   Social History Main Topics  . Smoking status: Never Smoker   . Smokeless  tobacco: Never Used  . Alcohol Use: No  . Drug Use: No  . Sexual Activity: Yes    Birth Control/ Protection: Surgical   Other Topics Concern  . Not on file   Social History Narrative  . No narrative on file    Review of Systems  Constitutional: Negative for fever, chills, diaphoresis, activity change, appetite change and fatigue.  HENT: Negative for ear pain, nosebleeds, congestion, facial swelling, rhinorrhea, neck pain, neck stiffness and ear discharge.   Eyes: Negative for pain, discharge, redness, itching and visual disturbance.  Respiratory: Negative for cough, choking, chest tightness, shortness of breath, wheezing and stridor.   Cardiovascular: Negative for chest pain, palpitations and leg swelling.  Gastrointestinal: Negative for abdominal distention.  Genitourinary: Negative for dysuria, urgency, frequency, hematuria, flank pain, decreased urine volume, difficulty urinating and dyspareunia.  Musculoskeletal: Negative for back pain, joint swelling, arthralgias and gait problem.  Neurological: Negative for dizziness, tremors, seizures, syncope, facial asymmetry, speech difficulty, weakness, light-headedness, numbness and headaches.  Hematological: Negative for adenopathy. Does not bruise/bleed easily.  Psychiatric/Behavioral: Negative for hallucinations, behavioral problems, confusion, dysphoric mood, decreased concentration and agitation.    Objective:   Filed Vitals:   04/30/13 1623  BP: 165/87  Pulse: 81  Temp: 98.4 F (36.9 C)  Resp: 18  Physical Exam  Constitutional: Appears well-developed and well-nourished. No distress.  HENT: Normocephalic. External right and left ear normal. Oropharynx is clear and moist.  Eyes: Conjunctivae and EOM are normal. PERRLA, no scleral icterus.  Neck: Normal ROM. Neck supple. No JVD. No tracheal deviation. No thyromegaly.  CVS: RRR, S1/S2 +, no murmurs, no gallops, no carotid bruit.  Pulmonary: Effort and breath sounds normal,  no stridor, rhonchi, wheezes, rales.  Abdominal: Soft. BS +,  no distension, tenderness, rebound or guarding.  Musculoskeletal: Normal range of motion. No edema and no tenderness.  Lymphadenopathy: No lymphadenopathy noted, cervical, inguinal. Neuro: Alert. Normal reflexes, muscle tone coordination. No cranial nerve deficit. Skin: Skin is warm and dry. No rash noted. Not diaphoretic. No erythema. No pallor.  Psychiatric: Normal mood and affect. Behavior, judgment, thought content normal.   No results found for this basename: WBC, HGB, HCT, MCV, PLT   Lab Results  Component Value Date   CREATININE 0.71 04/20/2013   BUN 12 04/20/2013   NA 139 04/20/2013   K 4.8 04/20/2013   CL 102 04/20/2013   CO2 29 04/20/2013    No results found for this basename: HGBA1C   Lipid Panel  No results found for this basename: chol, trig, hdl, cholhdl, vldl, ldlcalc       Assessment and plan:   Patient Active Problem List   Diagnosis Date Noted  . Hepatitis C 04/20/2013  . Hepatic adenoma 04/20/2013  . Chronic right shoulder pain 04/20/2013  . Insomnia 04/20/2013       Right shoulder pain We'll obtain an MRI of the C-spine and right shoulder She has been prescribed diclofenac and gabapentin Orthopedic referral has been provided The patient will follow up in one month   The patient was given clear instructions to go to ER or return to medical center if symptoms don't improve, worsen or new problems develop. The patient verbalized understanding. The patient was told to call to get any lab results if not heard anything in the next week.

## 2013-04-30 NOTE — Progress Notes (Unsigned)
Pt is still presenting extreme pain in her shoulder and neck. Pt recently had x rays which were negative. She is requesting further studies.

## 2013-05-14 ENCOUNTER — Ambulatory Visit (HOSPITAL_COMMUNITY): Admission: RE | Admit: 2013-05-14 | Payer: No Typology Code available for payment source | Source: Ambulatory Visit

## 2013-05-18 ENCOUNTER — Ambulatory Visit: Payer: No Typology Code available for payment source | Admitting: Internal Medicine

## 2013-05-19 ENCOUNTER — Ambulatory Visit (HOSPITAL_COMMUNITY)
Admission: RE | Admit: 2013-05-19 | Discharge: 2013-05-19 | Disposition: A | Discharge status: Home / Self Care | Payer: No Typology Code available for payment source | Source: Ambulatory Visit | Attending: Internal Medicine | Admitting: Internal Medicine

## 2013-05-19 DIAGNOSIS — M47812 Spondylosis without myelopathy or radiculopathy, cervical region: Secondary | ICD-10-CM | POA: Insufficient documentation

## 2013-05-19 DIAGNOSIS — M79609 Pain in unspecified limb: Secondary | ICD-10-CM | POA: Insufficient documentation

## 2013-05-19 DIAGNOSIS — M542 Cervicalgia: Secondary | ICD-10-CM

## 2013-05-19 DIAGNOSIS — M5020 Other cervical disc displacement, unspecified cervical region: Secondary | ICD-10-CM | POA: Insufficient documentation

## 2013-05-20 ENCOUNTER — Telehealth: Payer: Self-pay | Admitting: Internal Medicine

## 2013-05-20 DIAGNOSIS — M25511 Pain in right shoulder: Secondary | ICD-10-CM

## 2013-05-20 NOTE — Telephone Encounter (Signed)
Patient would like a referral for her a mammogram check-up. She would like to go to the breast center

## 2013-05-22 ENCOUNTER — Telehealth: Payer: Self-pay | Admitting: Emergency Medicine

## 2013-05-22 MED ORDER — TRAMADOL HCL 50 MG PO TABS: 50.0000 mg | ORAL_TABLET | Freq: Three times a day (TID) | ORAL | Status: DC | PRN

## 2013-05-22 NOTE — Telephone Encounter (Signed)
Pt called requesting MRI results and c/o headaches with taking prescribed Gabapentin. Pt informed to stop taking medication New script Tramadol called in [facility] Pt referred to sports medicine

## 2013-05-26 ENCOUNTER — Ambulatory Visit (INDEPENDENT_AMBULATORY_CARE_PROVIDER_SITE_OTHER): Payer: No Typology Code available for payment source | Admitting: Family Medicine

## 2013-05-26 ENCOUNTER — Encounter: Payer: Self-pay | Admitting: Family Medicine

## 2013-05-26 VITALS — BP 146/83 | HR 74 | Ht 66.5 in | Wt 157.0 lb

## 2013-05-26 DIAGNOSIS — M75101 Unspecified rotator cuff tear or rupture of right shoulder, not specified as traumatic: Secondary | ICD-10-CM

## 2013-05-26 DIAGNOSIS — S43429A Sprain of unspecified rotator cuff capsule, initial encounter: Secondary | ICD-10-CM

## 2013-05-26 MED ORDER — METHYLPREDNISOLONE ACETATE 40 MG/ML IJ SUSP
40.0000 mg | Freq: Once | INTRAMUSCULAR | Status: AC
  Administered 2013-05-26: 40 mg via INTRA_ARTICULAR

## 2013-05-26 NOTE — Patient Instructions (Signed)
Thank you for coming in today  You have a tear of your rotator cuff tendon, the supraspinatus We injected this today for pain relief We will refer you to physical therapy to strengthen muscles.

## 2013-05-26 NOTE — Progress Notes (Signed)
CC: Right shoulder pain HPI: Patient is a pleasant 62 year old female appearing younger than stated age who presents for right shoulder and neck pain. She states that she thinks this may have happened when she tried to pick up a lawnmower over the Fourth of July. Since that time she has had pain in her right shoulder as well as a burning pain up her neck that sometimes gives her headaches. She initially only had pain when laying down and this was partially relieved by heat and [place]. However, the pain has been worsening. She notes a throbbing in her lateral shoulder. She cannot sleep at night and has pain if she rolls over. She notes that she has difficulty moving the shoulder and reaching for things and washing her hair. She denies any known loss of strength. She has been treated with gabapentin and tramadol. She also is taking a large dose of Aleve which her primary doctor told her was too much. She had x-rays which were reportedly negative as well as an MRI of the shoulder that shows a full-thickness tear of the supraspinatus. She presents here for evaluation.  ROS: As above in the HPI. All other systems are stable or negative.  PMH: Hepatitis C, hepatic adenoma, insomnia  Social: Patient works as a Veterinary surgeon. She does not smoke or drink alcohol. Family: Family history is negative for diabetes, heart disease and positive for high blood pressure.  Allergies: Penicillin and Valium    OBJECTIVE: APPEARANCE:  Patient in no acute distress.The patient appeared well nourished and normally developed. HEENT: No scleral icterus. Conjunctiva non-injected Resp: Non labored Skin: No rash MSK:  Right Shoulder - No swelling or deformity - Severe tenderness to palpation over the lateral shoulder and humerus as well as the posterior aspect over the infraspinatus - FROM in flexion, abduction, internal, external rotation with considerable discomfort - Strength 5/5 on shoulder abduction, internal rotation,  external rotation, empty can but is difficult to truly assess due to pain - Positive empty can, Hawkin's, Neer's for impingement - Neurovascularly intact    ASSESSMENT: #1. Right rotator cuff tear with full-thickness tear supraspinatus on MRI   PLAN: Ideally, would have like to refer this patient for surgical opinion. However, she has no insurance at this time so this is not an option. We will therefore attempt conservative therapy with her. She agrees to subacromial injection today. Please see procedure note below. We will also refer her to physical therapy for strengthening of the rotator cuff as well as management of her secondary trapezius and neck pain. I will see her back in one month for reevaluation. She does become eligible for Medicare in February and I recommended that she obtain this in case she does need surgical referral.

## 2013-06-08 ENCOUNTER — Ambulatory Visit: Payer: No Typology Code available for payment source

## 2013-06-09 ENCOUNTER — Ambulatory Visit: Payer: No Typology Code available for payment source | Attending: Family Medicine

## 2013-06-09 DIAGNOSIS — M6281 Muscle weakness (generalized): Secondary | ICD-10-CM | POA: Insufficient documentation

## 2013-06-09 DIAGNOSIS — IMO0001 Reserved for inherently not codable concepts without codable children: Secondary | ICD-10-CM | POA: Insufficient documentation

## 2013-06-09 DIAGNOSIS — M25519 Pain in unspecified shoulder: Secondary | ICD-10-CM | POA: Insufficient documentation

## 2013-06-09 DIAGNOSIS — M25619 Stiffness of unspecified shoulder, not elsewhere classified: Secondary | ICD-10-CM | POA: Insufficient documentation

## 2013-06-09 DIAGNOSIS — R5381 Other malaise: Secondary | ICD-10-CM | POA: Insufficient documentation

## 2013-06-10 ENCOUNTER — Ambulatory Visit: Payer: No Typology Code available for payment source | Admitting: Physical Therapy

## 2013-06-12 ENCOUNTER — Ambulatory Visit: Payer: No Typology Code available for payment source | Attending: Family Medicine | Admitting: Physical Therapy

## 2013-06-12 DIAGNOSIS — M25619 Stiffness of unspecified shoulder, not elsewhere classified: Secondary | ICD-10-CM | POA: Insufficient documentation

## 2013-06-12 DIAGNOSIS — IMO0001 Reserved for inherently not codable concepts without codable children: Secondary | ICD-10-CM | POA: Insufficient documentation

## 2013-06-12 DIAGNOSIS — M6281 Muscle weakness (generalized): Secondary | ICD-10-CM | POA: Insufficient documentation

## 2013-06-12 DIAGNOSIS — R5381 Other malaise: Secondary | ICD-10-CM | POA: Insufficient documentation

## 2013-06-12 DIAGNOSIS — M25519 Pain in unspecified shoulder: Secondary | ICD-10-CM | POA: Insufficient documentation

## 2013-06-16 ENCOUNTER — Ambulatory Visit: Payer: No Typology Code available for payment source | Admitting: Physical Therapy

## 2013-06-18 ENCOUNTER — Ambulatory Visit: Payer: No Typology Code available for payment source | Admitting: Physical Therapy

## 2013-06-23 ENCOUNTER — Ambulatory Visit: Payer: No Typology Code available for payment source | Admitting: Physical Therapy

## 2013-06-23 ENCOUNTER — Ambulatory Visit: Payer: No Typology Code available for payment source | Admitting: Family Medicine

## 2013-06-25 ENCOUNTER — Ambulatory Visit: Payer: No Typology Code available for payment source | Admitting: Physical Therapy

## 2013-06-30 ENCOUNTER — Ambulatory Visit: Payer: No Typology Code available for payment source | Admitting: Physical Therapy

## 2013-07-02 ENCOUNTER — Ambulatory Visit: Payer: No Typology Code available for payment source | Admitting: Physical Therapy

## 2013-07-07 ENCOUNTER — Ambulatory Visit: Payer: No Typology Code available for payment source | Admitting: Physical Therapy

## 2013-07-08 ENCOUNTER — Ambulatory Visit: Payer: No Typology Code available for payment source | Admitting: Internal Medicine

## 2013-07-09 ENCOUNTER — Ambulatory Visit: Payer: No Typology Code available for payment source | Admitting: Physical Therapy

## 2013-07-13 ENCOUNTER — Encounter (HOSPITAL_COMMUNITY): Payer: Self-pay

## 2013-07-14 ENCOUNTER — Ambulatory Visit (HOSPITAL_COMMUNITY)
Admission: RE | Admit: 2013-07-14 | Discharge: 2013-07-14 | Disposition: A | Discharge status: Home / Self Care | Payer: Self-pay | Source: Ambulatory Visit | Attending: Obstetrics and Gynecology | Admitting: Obstetrics and Gynecology

## 2013-07-14 ENCOUNTER — Encounter (HOSPITAL_COMMUNITY): Payer: Self-pay

## 2013-07-14 ENCOUNTER — Other Ambulatory Visit: Payer: Self-pay | Admitting: Obstetrics and Gynecology

## 2013-07-14 ENCOUNTER — Ambulatory Visit (HOSPITAL_COMMUNITY): Payer: Self-pay

## 2013-07-14 VITALS — BP 148/84 | Temp 98.6°F | Ht 66.5 in | Wt 156.4 lb

## 2013-07-14 DIAGNOSIS — Z1231 Encounter for screening mammogram for malignant neoplasm of breast: Secondary | ICD-10-CM

## 2013-07-14 DIAGNOSIS — Z1239 Encounter for other screening for malignant neoplasm of breast: Secondary | ICD-10-CM

## 2013-07-14 NOTE — Progress Notes (Signed)
No complaints today.  Pap Smear:  Pap smear not performed today. Per patient last Pap smear was in 2008 and normal. Per patient she has no history of abnormal Pap smears. Patient has a history of a hysterectomy in 1995 for fibroids. No Pap smear results in EPIC.  Physical exam: Breasts Left breast slightly larger than right breast. Multiple scars bilateral breasts from history of surgical removal of benign breast masses. No nipple retraction bilateral breasts. No nipple discharge bilateral breasts. No lymphadenopathy. No lumps palpated bilateral breasts. No complaints of pain or tenderness on exam. Patient escorted to mammography for a screening mammogram.        Pelvic/Bimanual No Pap smear completed today since patient has a history of a hysterectomy for benign reasons. Pap smear not indicated per BCCCP guidelines.

## 2013-07-14 NOTE — Patient Instructions (Signed)
Taught patient how to perform BSE and gave educational materials to take home. Patient did not need a Pap smear today due to history of a hysterectomy for benign reasons. Let patient know that she will not need any further Pap smears.   Let patient know will follow up with her within the next couple weeks with results by letter or phone. Nicole Delgado verbalized understanding.  [name], Arvil Chaco, RN 12:33 PM

## 2013-07-15 ENCOUNTER — Ambulatory Visit (INDEPENDENT_AMBULATORY_CARE_PROVIDER_SITE_OTHER): Payer: Self-pay | Admitting: Family Medicine

## 2013-07-15 ENCOUNTER — Encounter: Payer: Self-pay | Admitting: Family Medicine

## 2013-07-15 VITALS — Ht 66.0 in | Wt 156.4 lb

## 2013-07-15 DIAGNOSIS — M75101 Unspecified rotator cuff tear or rupture of right shoulder, not specified as traumatic: Secondary | ICD-10-CM

## 2013-07-15 DIAGNOSIS — S43429A Sprain of unspecified rotator cuff capsule, initial encounter: Secondary | ICD-10-CM

## 2013-07-15 MED ORDER — TRAMADOL HCL 50 MG PO TABS: 50.0000 mg | ORAL_TABLET | Freq: Three times a day (TID) | ORAL | Status: DC | PRN

## 2013-07-15 NOTE — Progress Notes (Signed)
CC: Followup right shoulder pain HPI: Nicole Delgado is a very pleasant 64 rolled female who presents for followup of right shoulder pain. Recall that when I met her last she has had an MRI that showed a full-thickness tear of her supraspinatus. She was having neck and trapezius pain as a result of compensation for her shoulder. We gave her a subacromial injection and sent her to physical therapy. She states that she is doing tremendously better. She still does have some pain with use of Nicole arm such as driving or with sleeping on her right side. However she no longer has Nicole constant severe pain that she had previously. She is still doing physical therapy and finds this to be very helpful. She does still have a sudden twinges of severe pain over her right shoulder and right lateral upper arm.  ROS: As above in Nicole HPI. All other systems are stable or negative.  OBJECTIVE: APPEARANCE:  Patient in no acute distress.Nicole patient appeared well nourished and normally developed. HEENT: No scleral icterus. Conjunctiva non-injected Resp: Non labored Skin: No rash MSK:  Right Shoulder - No swelling or deformity - No TTP over AC joint, biceps tendon, or lateral humerus - FROM in flexion, abduction, internal, external rotation - Strength 4+/5 on empty can - Mild pain with empty can, Hawkin's - Neurovascularly intact   MSK [place]: Not performed   ASSESSMENT: #1. Right shoulder pain with full-thickness supraspinatus tear, improving   PLAN: I'm glad to see Nicole Delgado is doing much better. She does still have pain it bothers her when she is trying to sleep as well as with driving. Based on her continued symptoms I do think that a surgical consultation is at least warranted. She was asked to continue her course of physical therapy since this has been beneficial to her. We have set her up to see Dr. Tamera Punt over at [place] for surgical consultation.

## 2013-07-15 NOTE — Patient Instructions (Addendum)
Thank you for coming in today  1. Continue physical therapy 2. Tramadol refilled 3. Refer to orthopedic surgery - [facility] - Dr. Bretta Bang Ortho on 07/17/13 at 2:30 pm with Dr. Tamera Punt Please bring photo ID  [address]  [phone]

## 2013-07-16 ENCOUNTER — Other Ambulatory Visit (HOSPITAL_COMMUNITY): Payer: Self-pay | Admitting: *Deleted

## 2013-07-17 DIAGNOSIS — M75120 Complete rotator cuff tear or rupture of unspecified shoulder, not specified as traumatic: Secondary | ICD-10-CM | POA: Diagnosis not present

## 2013-08-14 ENCOUNTER — Ambulatory Visit: Payer: Self-pay

## 2013-08-14 ENCOUNTER — Other Ambulatory Visit: Payer: Self-pay

## 2013-09-01 DIAGNOSIS — M67919 Unspecified disorder of synovium and tendon, unspecified shoulder: Secondary | ICD-10-CM | POA: Diagnosis not present

## 2013-09-01 DIAGNOSIS — M24119 Other articular cartilage disorders, unspecified shoulder: Secondary | ICD-10-CM | POA: Diagnosis not present

## 2013-09-01 DIAGNOSIS — S43429A Sprain of unspecified rotator cuff capsule, initial encounter: Secondary | ICD-10-CM | POA: Diagnosis not present

## 2013-09-01 DIAGNOSIS — M25819 Other specified joint disorders, unspecified shoulder: Secondary | ICD-10-CM | POA: Diagnosis not present

## 2013-09-01 DIAGNOSIS — G8918 Other acute postprocedural pain: Secondary | ICD-10-CM | POA: Diagnosis not present

## 2013-09-01 DIAGNOSIS — M719 Bursopathy, unspecified: Secondary | ICD-10-CM | POA: Diagnosis not present

## 2013-10-06 DIAGNOSIS — M25519 Pain in unspecified shoulder: Secondary | ICD-10-CM | POA: Diagnosis not present

## 2013-10-06 DIAGNOSIS — S43429A Sprain of unspecified rotator cuff capsule, initial encounter: Secondary | ICD-10-CM | POA: Diagnosis not present

## 2013-10-21 DIAGNOSIS — M25519 Pain in unspecified shoulder: Secondary | ICD-10-CM | POA: Diagnosis not present

## 2013-10-21 DIAGNOSIS — S43429A Sprain of unspecified rotator cuff capsule, initial encounter: Secondary | ICD-10-CM | POA: Diagnosis not present

## 2013-10-28 DIAGNOSIS — S43429A Sprain of unspecified rotator cuff capsule, initial encounter: Secondary | ICD-10-CM | POA: Diagnosis not present

## 2013-10-28 DIAGNOSIS — M25519 Pain in unspecified shoulder: Secondary | ICD-10-CM | POA: Diagnosis not present

## 2014-03-19 ENCOUNTER — Ambulatory Visit (INDEPENDENT_AMBULATORY_CARE_PROVIDER_SITE_OTHER): Payer: Medicare Other

## 2014-03-19 ENCOUNTER — Ambulatory Visit (INDEPENDENT_AMBULATORY_CARE_PROVIDER_SITE_OTHER): Payer: Medicare Other | Admitting: Family Medicine

## 2014-03-19 VITALS — BP 130/80 | HR 90 | Temp 98.3°F | Resp 16

## 2014-03-19 DIAGNOSIS — M79672 Pain in left foot: Secondary | ICD-10-CM

## 2014-03-19 DIAGNOSIS — S99912A Unspecified injury of left ankle, initial encounter: Secondary | ICD-10-CM

## 2014-03-19 DIAGNOSIS — M25572 Pain in left ankle and joints of left foot: Secondary | ICD-10-CM

## 2014-03-19 DIAGNOSIS — S99922A Unspecified injury of left foot, initial encounter: Secondary | ICD-10-CM | POA: Diagnosis not present

## 2014-03-19 DIAGNOSIS — S92352A Displaced fracture of fifth metatarsal bone, left foot, initial encounter for closed fracture: Secondary | ICD-10-CM | POA: Diagnosis not present

## 2014-03-19 DIAGNOSIS — S92302A Fracture of unspecified metatarsal bone(s), left foot, initial encounter for closed fracture: Secondary | ICD-10-CM

## 2014-03-19 MED ORDER — OXYCODONE-ACETAMINOPHEN 5-325 MG PO TABS: 1.0000 | ORAL_TABLET | Freq: Three times a day (TID) | ORAL | Status: DC | PRN

## 2014-03-19 NOTE — Progress Notes (Signed)
Subjective:  This chart was scribed for Delman Cheadle, MD, by Neta Ehlers, ED Scribe. This  patient's care was started at 3:51 PM.   Patient ID: Nicole Delgado, female    DOB: Apr 04, 1951, 63 y.o.   MRN: [id number] Chief Complaint  Patient presents with  . Foot Injury    Left foot today    Foot Injury    Nicole Delgado is a 63 y.o. female who presents to [facility] complaining of an acute injury to her left foot which occurred today when she tripped on a cobblestone sidewalk and inverted her left ankle. There is pain, swelling, and a deformity associated with the injured site. She also endorses stiffness to her toes. She reports an inability to ambulate normally; she is in a wheelchair currently.    She has a h/o bunion surgery, but she denies a h/o additional surgeries or fractures to the affected foot.   Her residence is on the first floor.   Her orthopedist is Dr. Tamera Punt.    Past Medical History  Diagnosis Date  . Allergy    Current Outpatient Prescriptions on File Prior to Visit  Medication Sig Dispense Refill  . albuterol (PROVENTIL HFA;VENTOLIN HFA) 108 (90 BASE) MCG/ACT inhaler Inhale 2 puffs into the lungs every 6 (six) hours as needed for wheezing or shortness of breath.  1 Inhaler  0  . GLUCOSAMINE-CHONDROITIN PO Take 1 tablet by mouth 2 (two) times daily with a meal.      . traMADol (ULTRAM) 50 MG tablet Take 1 tablet (50 mg total) by mouth every 8 (eight) hours as needed for moderate pain.  50 tablet  0   No current facility-administered medications on file prior to visit.   Allergies  Allergen Reactions  . Bee Venom Swelling  . Penicillins Hives  . Valium [Diazepam] Other (See Comments)    Hallucinations    Review of Systems  Constitutional: Negative for fever.  Musculoskeletal: Positive for arthralgias.    Vitals: BP 130/80  Pulse 90  Temp(Src) 98.3 F (36.8 C) (Oral)  Resp 16  SpO2 97%    Objective:   Physical Exam  Nursing note and vitals  reviewed. Constitutional: She is oriented to person, place, and time. She appears well-developed and well-nourished. No distress.  HENT:  Head: Normocephalic and atraumatic.  Eyes: Conjunctivae and EOM are normal.  Neck: Neck supple. No tracheal deviation present.  Cardiovascular: Normal rate.   Pulses:      Dorsalis pedis pulses are 2+ on the left side.       Posterior tibial pulses are 2+ on the left side.  Pulmonary/Chest: Effort normal. No respiratory distress.  Musculoskeletal: Normal range of motion.  Left lower extremity: Severe swelling over head of metatarsal. Tenderness over distal fibula and tibia.   Neurological: She is alert and oriented to person, place, and time.  Skin: Skin is warm and dry.  Psychiatric: She has a normal mood and affect. Her behavior is normal.   [facility] reading (PRIMARY) by  Dr. Delman Cheadle. Left foot. Proximal 5th metatarsal styloid avulsion fracture. Pins from prior bunion surgery seen.  [facility] reading (PRIMARY) by  Dr. Delman Cheadle. Left ankle. No acute bony abnormality seen in ankle. Mortise view shows joint space well-preserved. Distal tib-fib are normal.   EXAM: LEFT FOOT - COMPLETE 3+ VIEW  COMPARISON: None.  FINDINGS: Frontal, oblique, and lateral views were obtained. There is postoperative change in the distal first metatarsal with previous bunionectomy.  There is a  transversely oriented fracture of the proximal fifth metatarsal with slight displacement of fracture fragments in this area. No other fractures. No dislocation. Joint spaces appear intact. No erosive change. There is a minimal inferior calcaneal spur.  IMPRESSION: Slightly displaced fracture proximal fifth metatarsal. Postoperative change distal first metatarsal. No other fractures. No dislocation.      Assessment & Plan:   Discussed treatment plan with patient, and the patient agreed to the plan.   Fracture surcharge today: Please try to schedule all fracture follow-ups with  me.  Injury of left foot, initial encounter - Plan: DG Foot Complete Left  Left ankle injury, initial encounter - Plan: DG Ankle Complete Left  Fracture of 5th metatarsal, left, closed, initial encounter - placed in short cam w/ crutches - wear at all times when upright. Ice 20 min qid and elevate. Recheck in 1 wk - sooner if worse but ok to defer to 10d if pt is on vacation.  Plan to transition to firm sold shoe in 2 wks but will take 6-8 wks to heal.   Meds ordered this encounter  Medications  . oxyCODONE-acetaminophen (ROXICET) 5-325 MG per tablet    Sig: Take 1 tablet by mouth every 8 (eight) hours as needed for severe pain.    Dispense:  40 tablet    Refill:  0    I personally performed the services described in this documentation, which was scribed in my presence. The recorded information has been reviewed and considered, and addended by me as needed.  Delman Cheadle, MD MPH

## 2014-03-19 NOTE — Patient Instructions (Signed)
Metatarsal Fracture  °with Rehab °A metatarsal fracture is a break (fracture) of one of the bones of the mid-foot (metatarsal bones). The metatarsal bones are responsible for maintaining the arch of the foot. There are three classifications of metatarsal fractures: dancer's fractures, Jones fractures, and stress fractures. A dancer's fracture is when a piece of bone is pulled off by a ligament or tendon (avulsion fracture) of the outer part of the foot (fifth metatarsal), near the joint with the ankle bones. A Jones fracture occurs in the middle of the fifth metatarsal. These fractures have limited ability to heal. A stress fracture occurs when the bone is slowly injured faster than it can repair itself. °SYMPTOMS  °· Sharp pain, especially with standing or walking. °· Tenderness, swelling, and later bruising (contusion) of the foot. °· Numbness or paralysis from swelling in the foot, causing pressure on the blood vessels or nerves (uncommon). °CAUSES  °Fractures occur when a force is placed on the bone that is greater than it can handle. Common causes of injury include: °· Direct hit (trauma) to the foot. °· Twisting injury to the foot or ankle. °· Landing on the foot and ankle in an improper position. °RISK INCRESES WITH: °· Participation in contact sports, sports that require jumping and landing, or sports in which cleats are worn and sliding occurs. °· Previous foot or ankle sprains or dislocations. °· Repeated injury to any joint in the foot. °· Poor strength and flexibility. °PREVENTION °· Warm up and stretch properly before an activity. °· Allow for adequate recovery between workouts. °· Maintain physical fitness in: °¨ Strength, flexibility, and endurance. °¨ Cardiovascular fitness. °· When participating in jumping or contact sports, protect joints with supportive devices, such as wrapped elastic bandages, tape, braces, or high-top athletic shoes. °· Wear properly fitted and padded protective  equipment. °PROGNOSIS °If treated properly, metatarsal fractures usually heal well. Jones fractures have a higher risk of the bone failing to heal (nonunion). Sometimes, surgery is needed to heal Jones fractures.  °RELATED COMPLICATIONS  °· Nonunion. °· Fracture heals in a poor position (malunion). °· Long-term (chronic) pain, stiffness, or swelling of the foot. °· Excessive bleeding in the foot or at the dislocation site, causing pressure and injury to nerves and blood vessels (rare). °· Unstable or arthritic joint following repeated injury or delayed treatment. °TREATMENT  °Treatment first involves the use of ice and medicine, to reduce pain and inflammation. If the bone fragments are out of alignment (displaced), then immediate realigning of the bones (reduction) is required. Fractures that cannot be realigned by hand, or where the bones protrude through the skin (open), may require surgery to hold the fracture in place with screws, pins, and plates. After the bones are in proper alignment, the foot and ankle must be restrained for 6 or more weeks. Restraint allows healing to occur. After restraint, it is important to perform strengthening and stretching exercises to help regain strength and a full range of motion. These exercises may be completed at home or with a therapist. A stiff-soled shoe and arch support (orthotic) may be required when first returning to sports. °MEDICATION  °· If pain medicine is needed, nonsteroidal anti-inflammatory medicines (NSAIDS), or other minor pain relievers, are often advised. °· Do not take pain medicine for 7 days before surgery. °· Only take over-the-counter or prescription medicines for pain, fever, or discomfort as directed by your caregiver. °COLD THERAPY  °Cold treatment (icing) should be applied for 10 to 15 minutes every 2 to   3 hours for inflammations and pain, and immediately after activity that aggravates your symptoms. Use ice packs or ice massage. °SEEK MEDICAL CARE  IF: °· Pain, tenderness, or swelling gets worse, despite treatment. °· You experience pain, numbness, or coldness in the foot. °· Blue, gray, or dark color appears in the toenails. °· You or your child has an oral temperature above 102° F (38.9° C). °· You have increased pain, swelling, and redness. °· You have drainage of fluids or bleeding in the affected area. °· New, unexplained symptoms develop. (Drugs used in treatment may produce side effects.) °EXERCISES °RANGE OF MOTION (ROM) AND STRETCHING EXERCISES - Metatarsal Fracture (including Jones and Dancer's Fractures) °These exercises may help you when beginning to rehabilitate your injury. Your symptoms may resolve with or without further involvement from your physician, physical therapist, or athletic trainer. While completing these exercises, remember:  °· Restoring tissue flexibility helps normal motion to return to the joints. This allows healthier, less painful movement and activity. °· An effective stretch should be held for at least 30 seconds. °A stretch should never be painful. You should only feel a gentle lengthening or release in the stretched. °RANGE OF MOTION - Dorsi/Plantar Flexion °· While sitting with your right / left knee straight, draw the top of your foot upwards, by flexing your ankle. Then reverse the motion, pointing your toes downward. °· Hold each position for __________ seconds. °· After completing your first set of exercises, repeat this exercise with your knee bent. °Repeat __________ times. Complete this exercise __________ times per day.  °RANGE OF MOTION - Ankle Alphabet °· Imagine your right / left big toe is a pen. °· Keeping your hip and knee still, write out the entire alphabet with your "pen." Make the letters as large as you can, without increasing any discomfort. °Repeat __________ times. Complete this exercise __________ times per day.  °STRETCH - Gastroc, Standing °· Place your hands on a wall. °· Extend your right / left  leg behind you, keeping the front knee somewhat bent. °· Slightly point your toes inward on your back foot. °· Keeping your right / left heel on the floor and your knee straight, shift your weight toward the wall, not allowing your back to arch. °· You should feel a gentle stretch in the right / left calf. Hold this position for __________ seconds. °Repeat __________ times. Complete this stretch __________ times per day. °STRETCH - Soleus, Standing  °· Place your hands on a wall. °· Extend your right / left leg behind you, keeping the other knee somewhat bent. °· Slightly point your toes inward on your back foot. °· Keep your right / left heel on the floor, bend your back knee, and slightly shift your weight over the back leg so that you feel a gentle stretch deep in your back calf. °· Hold this position for __________ seconds. °Repeat __________ times. Complete this stretch __________ times per day. °STRENGTHENING EXERCISES - Metatarsal Fracture (Including Jones and Dancer's Fractures) °These exercises may help you when beginning to rehabilitate your injury. They may resolve your symptoms with or without further involvement from your physician, physical therapist, or athletic trainer. While completing these exercises, remember:  °· Muscles can gain both the endurance and the strength needed for everyday activities through controlled exercises. °· Complete these exercises as instructed by your physician, physical therapist or athletic trainer. Increase the resistance and repetitions only as guided by your caregiver. °STRENGTH - Dorsiflexors °· Secure a rubber exercise   band or tubing to a fixed object (table, pole) and loop the other end around your right / left foot. °· Sit on the floor facing the fixed object. The band should be slightly tense when your foot is relaxed. °· Slowly draw your foot back toward you, using your ankle and toes. °· Hold this position for __________ seconds. Slowly release the tension in  the band and return your foot to the starting position. °Repeat __________ times. Complete this exercise __________ times per day.  °STRENGTH - Plantar-flexors  °· Sit with your right / left leg extended. Holding onto both ends of a rubber exercise band or tubing, loop it around the ball of your foot. Keep a slight tension in the band. °· Slowly push your toes away from you, pointing them downward. °· Hold this position for __________ seconds. Return slowly, controlling the tension in the band. °Repeat __________ times. Complete this exercise __________ times per day.  °STRENGTH - Plantar-flexors °· Stand with your feet shoulder width apart. Steady yourself with a wall or table, using as little support as needed. °· Keeping your weight evenly spread over the width of your feet, rise up on your toes.* °· Hold this position for __________ seconds. °Repeat __________ times. Complete this exercise __________ times per day.  °*If this is too easy, shift your weight toward your right / left leg until you feel challenged. Ultimately, you may be asked to do this exercise while standing on your right / left foot only. °STRENGTH - Towel Curls °· Sit in a chair, on a non-carpeted surface. °· Place your foot on a towel, keeping your heel on the floor. °· Pull the towel toward your heel only by curling your toes. Keep your heel on the floor. °· If instructed by your physician, physical therapist, or athletic trainer, weight may be added at the end of the towel. °Repeat __________ times. Complete this exercise __________ times per day. °STRENGTH - Ankle Eversion °· Secure one end of a rubber exercise band or tubing to a fixed object (table, pole). Loop the other end around your foot, just before your toes. °· Place your fists between your knees. This will focus your strengthening at your ankle. °· Drawing the band across your opposite foot, away from the pole, slowly, pull your little toe out and up. Make sure the band is  positioned to resist the entire motion. °· Hold this position for __________ seconds. °· Have your muscles resist the band, as it slowly pulls your foot back to the starting position. °Repeat __________ times. Complete this exercise __________ times per day.  °STRENGTH - Ankle Inversion °· Secure one end of a rubber exercise band or tubing to a fixed object (table, pole). Loop the other end around your foot, just before your toes. °· Place your fists between your knees. This will focus your strengthening at your ankle. °· Slowly, pull your big toe up and in, making sure the band is positioned to resist the entire motion. °· Hold this position for __________ seconds. °· Have your muscles resist the band, as it slowly pulls your foot back to the starting position. °Repeat __________ times. Complete this exercises __________ times per day.  °Document Released: 05/28/2005 Document Revised: 08/20/2011 Document Reviewed: 09/10/2013 °[employer]® Patient Information ©2015 [employer]. This information is not intended to replace advice given to you by your health care provider. Make sure you discuss any questions you have with your health care provider. ° °

## 2014-04-02 ENCOUNTER — Ambulatory Visit (INDEPENDENT_AMBULATORY_CARE_PROVIDER_SITE_OTHER): Payer: Medicare Other

## 2014-04-02 ENCOUNTER — Ambulatory Visit (INDEPENDENT_AMBULATORY_CARE_PROVIDER_SITE_OTHER): Payer: Medicare Other | Admitting: Family Medicine

## 2014-04-02 VITALS — BP 124/72 | HR 97 | Temp 98.3°F | Resp 18

## 2014-04-02 DIAGNOSIS — S92902D Unspecified fracture of left foot, subsequent encounter for fracture with routine healing: Secondary | ICD-10-CM

## 2014-04-02 NOTE — Patient Instructions (Signed)
Please return in 2 weeks so we can recheck the foot. In the meantime use flat soled shoes without heels (preferably running shoes that lace up)

## 2014-04-02 NOTE — Progress Notes (Signed)
Subjective:  This chart was scribed for Robyn Haber by Dellis Filbert, ED Scribe at [facility].The patient was seen in Exam room 11 and the patient's care was started at 5:43 PM.   Patient ID: Nicole Delgado, female    DOB: 16-May-1951, 63 y.o.   MRN: [id number]  HPI HPI Comments: Nicole Delgado is a 63 y.o. female who presents to [facility] here for a follow up. Pt broke her left foot two weeks around 2 PM. She was walking on cobblestone sidewalk and slipped and broke her foot. She notes the swelling has gone down. 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  Patient Active Problem List   Diagnosis Date Noted  . Hepatitis C 04/20/2013  . Hepatic adenoma 04/20/2013  . Chronic right shoulder pain 04/20/2013  . Insomnia 04/20/2013   Past Medical History  Diagnosis Date  . Allergy    Past Surgical History  Procedure Laterality Date  . Breast surgery      breast biopies x4  . Abdominal hysterectomy     Allergies  Allergen Reactions  . Bee Venom Swelling  . Penicillins Hives  . Valium [Diazepam] Other (See Comments)    Hallucinations   Prior to Admission medications   Medication Sig Start Date End Date Taking? Authorizing Provider  albuterol (PROVENTIL HFA;VENTOLIN HFA) 108 (90 BASE) MCG/ACT inhaler Inhale 2 puffs into the lungs every 6 (six) hours as needed for wheezing or shortness of breath. 04/14/13  Yes Tresa Garter, MD  GLUCOSAMINE-CHONDROITIN PO Take 1 tablet by mouth 2 (two) times daily with a meal.   Yes Historical Provider, MD  oxyCODONE-acetaminophen (ROXICET) 5-325 MG per tablet Take 1 tablet by mouth every 8 (eight) hours as needed for severe pain. 03/19/14   Shawnee Knapp, MD  traMADol (ULTRAM) 50 MG tablet Take 1 tablet (50 mg total) by mouth every 8 (eight) hours as needed for moderate pain. 07/15/13   Duane Boston, MD    History   Social History  . Marital Status: Divorced    Spouse Name: N/A    Number of Children: N/A  . Years of Education: N/A   Occupational History  . Not on file.   Social History Main Topics  . Smoking status: Never Smoker   . Smokeless tobacco: Never Used  . Alcohol Use: No  . Drug Use: No  . Sexual Activity: Yes    Birth Control/ Protection: Surgical   Other Topics Concern  . Not on file   Social History Narrative  . No narrative on file    Review of Systems  Musculoskeletal: Positive for arthralgias and gait problem.  All other systems reviewed and are negative.      Objective:   Physical Exam  Nursing note and vitals reviewed. Constitutional: She is oriented to person, place, and time. She appears well-developed and well-nourished. No distress.  HENT:  Head: Normocephalic and atraumatic.  Eyes: EOM are normal.  Neck: Normal range of motion.  Cardiovascular: Normal rate.   Pulmonary/Chest: Effort normal.  Musculoskeletal: Normal range of motion.  Tender over fifth metatarsal   Neurological: She is alert and oriented to person, place, and time.  Skin: Skin is warm and dry.  Psychiatric: She has a normal mood and affect. Her behavior is normal.   [facility] reading (PRIMARY) by  Dr. Joseph Art nondisplaced Ronnald Ramp fracture of the left foot. No significant change in appearance from previous film.    BP 124/72  Pulse 97  Temp(Src) 98.3 F (36.8 C) (Oral)  Resp 18  SpO2 95%      Assessment & Plan:  I personally performed the services described in this documentation, which was scribed in my presence. The recorded information has been reviewed and is accurate.  Patient instructed to use lace up Swede-O brace, were flat soled shoes, and return in 2 weeks.  Signed, Robyn Haber

## 2014-04-07 ENCOUNTER — Ambulatory Visit (INDEPENDENT_AMBULATORY_CARE_PROVIDER_SITE_OTHER): Payer: Medicare Other | Admitting: Emergency Medicine

## 2014-04-07 VITALS — BP 120/78 | HR 78 | Temp 98.7°F | Resp 18 | Ht 65.0 in | Wt 167.0 lb

## 2014-04-07 DIAGNOSIS — J019 Acute sinusitis, unspecified: Secondary | ICD-10-CM

## 2014-04-07 MED ORDER — PSEUDOEPHEDRINE-GUAIFENESIN ER 60-600 MG PO TB12: 1.0000 | ORAL_TABLET | Freq: Two times a day (BID) | ORAL | Status: DC

## 2014-04-07 MED ORDER — LEVOFLOXACIN 500 MG PO TABS: 500.0000 mg | ORAL_TABLET | Freq: Every day | ORAL | Status: AC

## 2014-04-07 NOTE — Progress Notes (Signed)
[facility] [address] [place] [address] [phone]  Date:  04/07/2014   Name:  Nicole Delgado   DOB:  Jun 12, 1950   MRN:  [id number]  PCP:  Angelica Chessman, MD    Chief Complaint: Asthma and Cough   History of Present Illness:  Nicole Delgado is a 63 y.o. very pleasant female patient who presents with the following:  History of asthma and has increased wheezing not responding to MDI. No fever or chills.   No nasal congestion but has moderate post nasal drainage. Sore throat.  No nausea or vomiting. Cough that is not productive. Has been overusing her MDI. No improvement with over the counter medications or other home remedies.  Denies other complaint or health concern today.   Patient Active Problem List   Diagnosis Date Noted  . Hepatitis C 04/20/2013  . Hepatic adenoma 04/20/2013  . Chronic right shoulder pain 04/20/2013  . Insomnia 04/20/2013    Past Medical History  Diagnosis Date  . Allergy   . Asthma     Past Surgical History  Procedure Laterality Date  . Breast surgery      breast biopies x4  . Abdominal hysterectomy      History  Substance Use Topics  . Smoking status: Never Smoker   . Smokeless tobacco: Never Used  . Alcohol Use: No    Family History  Problem Relation Age of Onset  . Hypertension Sister   . Colon cancer Neg Hx   . Alzheimer's disease Mother   . Heart Problems Father     Allergies  Allergen Reactions  . Bee Venom Swelling  . Penicillins Hives  . Valium [Diazepam] Other (See Comments)    Hallucinations    Medication list has been reviewed and updated.  Current Outpatient Prescriptions on File Prior to Visit  Medication Sig Dispense Refill  . albuterol (PROVENTIL HFA;VENTOLIN HFA) 108 (90 BASE) MCG/ACT inhaler Inhale 2 puffs into the lungs every 6 (six) hours as needed for wheezing or shortness of breath.  1 Inhaler  0  . GLUCOSAMINE-CHONDROITIN PO Take 1 tablet by mouth 2 (two) times  daily with a meal.       No current facility-administered medications on file prior to visit.    Review of Systems:  As per HPI, otherwise negative.    Physical Examination: Filed Vitals:   04/07/14 1541  BP: 120/78  Pulse: 78  Temp: 98.7 F (37.1 C)  Resp: 18   Filed Vitals:   04/07/14 1541  Height: 5\' 5"  (1.651 m)  Weight: 167 lb (75.751 kg)   Body mass index is 27.79 kg/(m^2). Ideal Body Weight: Weight in (lb) to have BMI = 25: 149.9  GEN: WDWN, NAD, Non-toxic, A & O x 3 HEENT: Atraumatic, Normocephalic. Neck supple. No masses, No LAD. Ears and Nose: No external deformity. CV: RRR, No M/G/R. No JVD. No thrill. No extra heart sounds. PULM: CTA B, no wheezes, crackles, rhonchi. No retractions. No resp. distress. No accessory muscle use. ABD: S, NT, ND, +BS. No rebound. No HSM. EXTR: No c/c/e NEURO Normal gait.  PSYCH: Normally interactive. Conversant. Not depressed or anxious appearing.  Calm demeanor.    Assessment and Plan: Sinusitis Post nasal drainage augmentin mucinex  Signed,  Ellison Carwin, MD

## 2014-04-07 NOTE — Patient Instructions (Signed)

## 2014-04-08 DIAGNOSIS — H18603 Keratoconus, unspecified, bilateral: Secondary | ICD-10-CM | POA: Diagnosis not present

## 2014-04-08 DIAGNOSIS — H52213 Irregular astigmatism, bilateral: Secondary | ICD-10-CM | POA: Diagnosis not present

## 2014-05-11 ENCOUNTER — Ambulatory Visit (INDEPENDENT_AMBULATORY_CARE_PROVIDER_SITE_OTHER): Payer: Medicare Other | Admitting: Family Medicine

## 2014-05-11 ENCOUNTER — Ambulatory Visit (INDEPENDENT_AMBULATORY_CARE_PROVIDER_SITE_OTHER): Payer: Medicare Other

## 2014-05-11 VITALS — BP 152/88 | HR 80 | Temp 98.5°F | Resp 18 | Ht 66.25 in | Wt 167.2 lb

## 2014-05-11 DIAGNOSIS — S92302S Fracture of unspecified metatarsal bone(s), left foot, sequela: Secondary | ICD-10-CM

## 2014-05-11 NOTE — Progress Notes (Signed)
Subjective:    Patient ID: Nicole Delgado, female    DOB: 1951-03-02, 63 y.o.   MRN: [id number]  This chart was scribed for Nicole Ray, MD by Nicole Delgado, ED Scribe. This patient was seen in room 10 and the patient's care was started at 10:35 AM.   HPI  HPI Comments: Nicole Delgado is a 63 y.o. female who presents to [facility] for a follow-up of a left foot fracture. She was first seen here on Oct 9th by Dr. Brigitte Delgado for a fifth metatarsal fracture after tripping on the sidewalk. She had a slightly displaced fracture to the proximal fifth on an XR report. She was placed in a short CAM walker with crutches initially, as well as Percocet as needed for pain. Patient had been followed by Dr. Joseph Delgado on Oct 23rd; swelling had improved. She was transitioned to a Swede-O brace and flat soled shoes. XR at that time indicated stable appearance of the fracture at the base of the left fifth metatarsal. She is here for a follow-up.  Patient reports that she has been doing well with the brace, but started feeling burning on top of her left foot that feels like it might be related to her nerves. Her foot feels okay with a brace on, but when she is walking around at night without the brace, she says it feels like her "arch is falling in."   Patient says that she had tried using an exercise bike briefly 5 days ago, which made her left foot feel sore afterwards when she was walking up some steps.   Review of Systems     Objective:   Physical Exam  Constitutional: She is oriented to person, place, and time. She appears well-developed and well-nourished. No distress.  HENT:  Head: Normocephalic and atraumatic.  Eyes: Conjunctivae and EOM are normal.  Neck: Neck supple. No tracheal deviation present.  Cardiovascular: Normal rate.   Pulmonary/Chest: Effort normal. No respiratory distress.  Musculoskeletal: Normal range of motion.  Slight tenderness along the proximal fifth metatarsal,  over the dorsum of the left lateral foot.  Cap refill is <1 second and NVI distally to toes.  DP Delgado 2+.  Full ROM and full strength of the left foot without apparent pain on exam.  No other focal tenderness.   Neurological: She is alert and oriented to person, place, and time.  Skin: Skin is warm and dry.  Psychiatric: She has a normal mood and affect. Her behavior is normal.  Nursing note and vitals reviewed.   Filed Vitals:   05/11/14 1004  BP: 152/88  Delgado: 80  Temp: 98.5 F (36.9 C)  Resp: 18    [facility] reading (PRIMARY) by  Dr. Carlota Delgado: L foot: persistent fracture at base of 5th metatarsal with possible widening laterally. No other new findings.       Assessment & Plan:  Nicole Delgado is a 63 y.o. female Fracture of 5th metatarsal, left, sequela - Plan: DG Foot Complete Left, Ambulatory referral to Orthopedic Surgery Persistent pain and dysesthesias over 5th MT fracture, but NVI distally. Persistent fracture seen with possible worsening with recent episode of stair walking, and possible widening at fracture line.  -return to walking boot, NWB with crutch or walker at home.   -refer to ortho for eval of persistent fracture as without significant callous seen at this time.  -rtc precautions discussed in interim.   No orders of the defined types were placed in this  encounter.   Patient Instructions  We will refer you to orthopaedics. Return to walking boot and crutches or other device to avoid weightbearing to this foot until evaluated by orthopaedics. Return to the clinic or go to the nearest emergency room if any of your symptoms worsen or new symptoms occur.     I personally performed the services described in this documentation, which was scribed in my presence. The recorded information has been reviewed and considered, and addended by me as needed.

## 2014-05-11 NOTE — Patient Instructions (Signed)
We will refer you to orthopaedics. Return to walking boot and crutches or other device to avoid weightbearing to this foot until evaluated by orthopaedics. Return to the clinic or go to the nearest emergency room if any of your symptoms worsen or new symptoms occur.

## 2014-05-14 DIAGNOSIS — H18603 Keratoconus, unspecified, bilateral: Secondary | ICD-10-CM | POA: Diagnosis not present

## 2014-05-14 DIAGNOSIS — H52213 Irregular astigmatism, bilateral: Secondary | ICD-10-CM | POA: Diagnosis not present

## 2014-06-16 ENCOUNTER — Ambulatory Visit (INDEPENDENT_AMBULATORY_CARE_PROVIDER_SITE_OTHER): Payer: PPO | Admitting: Family Medicine

## 2014-06-16 VITALS — BP 140/80 | HR 74 | Temp 97.6°F | Resp 16 | Ht 65.0 in | Wt 165.0 lb

## 2014-06-16 DIAGNOSIS — Z1159 Encounter for screening for other viral diseases: Secondary | ICD-10-CM

## 2014-06-16 DIAGNOSIS — L03012 Cellulitis of left finger: Secondary | ICD-10-CM

## 2014-06-16 MED ORDER — DOXYCYCLINE HYCLATE 100 MG PO CAPS: 100.0000 mg | ORAL_CAPSULE | Freq: Two times a day (BID) | ORAL | Status: DC

## 2014-06-16 NOTE — Progress Notes (Signed)
[facility] [address], [place] [address] [phone]  Date:  06/16/2014   Name:  Nicole Delgado   DOB:  1951-03-17   MRN:  [id number]  PCP:  Angelica Chessman, MD    Chief Complaint: Finger pain   History of Present Illness:  Nicole Delgado is a 64 y.o. very pleasant female patient who presents with the following:  History of hepaitis C- OW generally healthy.  She noted puffiness and pus at the end of her left index finger this am. She cannot recall any recent injury, had her nails done 3 weeks ago.   OW she feels well, no fever  She is not aware of any history of Hepatiits C- it seems that there is another Chelly Dombeck in [place] with the same middle initial AND same date of birth.  Looking in her chart there is listed a diagnosis of hep C- however her ab test was negative.  She would like to be tested again today to make sure she does not have Hep C.   Patient Active Problem List   Diagnosis Date Noted  . Hepatitis C 04/20/2013  . Hepatic adenoma 04/20/2013  . Chronic right shoulder pain 04/20/2013  . Insomnia 04/20/2013    Past Medical History  Diagnosis Date  . Allergy   . Asthma     Past Surgical History  Procedure Laterality Date  . Breast surgery      breast biopies x4  . Abdominal hysterectomy      History  Substance Use Topics  . Smoking status: Never Smoker   . Smokeless tobacco: Never Used  . Alcohol Use: No    Family History  Problem Relation Age of Onset  . Hypertension Sister   . Colon cancer Neg Hx   . Alzheimer's disease Mother   . Heart Problems Father     Allergies  Allergen Reactions  . Bee Venom Swelling  . Penicillins Hives  . Valium [Diazepam] Other (See Comments)    Hallucinations    Medication list has been reviewed and updated.  Current Outpatient Prescriptions on File Prior to Visit  Medication Sig Dispense Refill  . albuterol (PROVENTIL HFA;VENTOLIN HFA) 108 (90 BASE) MCG/ACT inhaler Inhale 2 puffs  into the lungs every 6 (six) hours as needed for wheezing or shortness of breath. 1 Inhaler 0  . GLUCOSAMINE-CHONDROITIN PO Take 1 tablet by mouth 2 (two) times daily with a meal.     No current facility-administered medications on file prior to visit.    Review of Systems:  As per HPI- otherwise negative.   Physical Examination: Filed Vitals:   06/16/14 1441  BP: 140/80  Pulse: 74  Temp: 97.6 F (36.4 C)  Resp: 16   Filed Vitals:   06/16/14 1441  Height: 5\' 5"  (1.651 m)  Weight: 165 lb (74.844 kg)   Body mass index is 27.46 kg/(m^2). Ideal Body Weight: Weight in (lb) to have BMI = 25: 149.9  GEN: WDWN, NAD, Non-toxic, A & O x 3, looks well HEENT: Atraumatic, Normocephalic. Neck supple. No masses, No LAD. Ears and Nose: No external deformity. CV: RRR, No M/G/R. No JVD. No thrill. No extra heart sounds. PULM: CTA B, no wheezes, crackles, rhonchi. No retractions. No resp. distress. No accessory muscle use. EXTR: No c/c/e NEURO Normal gait.  PSYCH: Normally interactive. Conversant. Not depressed or anxious appearing.  Calm demeanor.  Left index finger: there is an apparent paronychia on her left index finger  with puffiness and tenderness just around the cuticle.   VC obtained Prepped with alcohol and I and D with 21 gauge needle.  suprisingly expressed thick gelatinous clear material typical of a ganglion cyst.  Dressed with band- aid  Assessment and Plan: Paronychia, left - Plan: doxycycline (VIBRAMYCIN) 100 MG capsule, Hepatitis C antibody  Need for hepatitis C screening test - Plan: Hepatitis C antibody  Atypical paronychia vs cyst left index finger.  Will cover with doxycycline. She is to let me know if it comes back Will rescreen for hep C today  Signed Lamar Blinks, MD

## 2014-06-16 NOTE — Patient Instructions (Signed)
We are going to treat you with an antibiotic for the area on your finger. If the fluid comes back please give me a call and I will refer you to a hand specialist.

## 2014-06-17 LAB — HEPATITIS C ANTIBODY: HCV AB: NEGATIVE

## 2014-07-16 ENCOUNTER — Ambulatory Visit (INDEPENDENT_AMBULATORY_CARE_PROVIDER_SITE_OTHER): Payer: PPO | Admitting: Family Medicine

## 2014-07-16 VITALS — BP 158/76 | HR 75 | Temp 98.6°F | Resp 16 | Ht 65.5 in | Wt 165.0 lb

## 2014-07-16 DIAGNOSIS — E663 Overweight: Secondary | ICD-10-CM

## 2014-07-16 DIAGNOSIS — I10 Essential (primary) hypertension: Secondary | ICD-10-CM

## 2014-07-16 DIAGNOSIS — R200 Anesthesia of skin: Secondary | ICD-10-CM

## 2014-07-16 DIAGNOSIS — G479 Sleep disorder, unspecified: Secondary | ICD-10-CM

## 2014-07-16 DIAGNOSIS — R202 Paresthesia of skin: Secondary | ICD-10-CM

## 2014-07-16 MED ORDER — TRAZODONE HCL 50 MG PO TABS: 50.0000 mg | ORAL_TABLET | Freq: Every evening | ORAL | Status: DC | PRN

## 2014-07-16 NOTE — Progress Notes (Signed)
Subjective: 64 year old lady who is here for she is concerned about her blood pressure. It has been creeping up. It is always high systolic. She is gotten it taken elsewhere and it is usually high on the systolic. It bounces around some which concerns her. She used to be a Production designer, theatre/television/film, and a lot more calories. She realizes her weight has crept up. She does not smoke drink or use substances. She is a vegetarian. She has always been pretty healthy. She has a little burning discomfort in her left arm for which she's been there for some time. She wondered whether comfortable sleeping on her left side. It does not activity induced. She has gotten over the broken foot, but is not him back in excess.  Objective: Overweight lady, pleasant and oriented. Chest clear. Heart regular without murmurs. Neck has no carotid bruits.. Full range of motion.  Assessment: Systolic hypertension Sleep disturbance Left arm burning pain   Plan:  she also wanted to talk about why she doesn't sleep. We had a long talk about that.  Trazodone 50 mg at bedtime  Weight loss and exercise  Did an EKG because of the left arm tingling sensation. It is normal. The symptoms may comfortable just laying wrong and pinching a nerve. If she keeps getting symptoms we will have to workup further.

## 2014-07-16 NOTE — Patient Instructions (Signed)
Work hard at regular weight loss and exercise  Return for your physical as scheduled  Take trazodone 50 mg one at bedtime. We can increase it if necessary, but I think that will help you sleep some. Use good sleep practices as discussed

## 2014-09-03 ENCOUNTER — Ambulatory Visit (INDEPENDENT_AMBULATORY_CARE_PROVIDER_SITE_OTHER): Payer: PPO

## 2014-09-03 ENCOUNTER — Encounter: Payer: Self-pay | Admitting: Family Medicine

## 2014-09-03 ENCOUNTER — Ambulatory Visit (INDEPENDENT_AMBULATORY_CARE_PROVIDER_SITE_OTHER): Payer: PPO | Admitting: Family Medicine

## 2014-09-03 VITALS — BP 138/84 | HR 70 | Temp 97.5°F | Resp 16 | Ht 65.0 in | Wt 166.0 lb

## 2014-09-03 DIAGNOSIS — G629 Polyneuropathy, unspecified: Secondary | ICD-10-CM | POA: Diagnosis not present

## 2014-09-03 DIAGNOSIS — Z13820 Encounter for screening for osteoporosis: Secondary | ICD-10-CM

## 2014-09-03 DIAGNOSIS — R200 Anesthesia of skin: Secondary | ICD-10-CM | POA: Diagnosis not present

## 2014-09-03 DIAGNOSIS — N6012 Diffuse cystic mastopathy of left breast: Secondary | ICD-10-CM

## 2014-09-03 DIAGNOSIS — M1990 Unspecified osteoarthritis, unspecified site: Secondary | ICD-10-CM

## 2014-09-03 DIAGNOSIS — Z0000 Encounter for general adult medical examination without abnormal findings: Secondary | ICD-10-CM | POA: Diagnosis not present

## 2014-09-03 DIAGNOSIS — E559 Vitamin D deficiency, unspecified: Secondary | ICD-10-CM

## 2014-09-03 DIAGNOSIS — N6011 Diffuse cystic mastopathy of right breast: Secondary | ICD-10-CM | POA: Diagnosis not present

## 2014-09-03 DIAGNOSIS — Z1239 Encounter for other screening for malignant neoplasm of breast: Secondary | ICD-10-CM

## 2014-09-03 DIAGNOSIS — Z780 Asymptomatic menopausal state: Secondary | ICD-10-CM

## 2014-09-03 DIAGNOSIS — Z23 Encounter for immunization: Secondary | ICD-10-CM | POA: Diagnosis not present

## 2014-09-03 DIAGNOSIS — R202 Paresthesia of skin: Principal | ICD-10-CM

## 2014-09-03 LAB — POCT URINALYSIS DIPSTICK
BILIRUBIN UA: NEGATIVE
Blood, UA: NEGATIVE
GLUCOSE UA: NEGATIVE
Ketones, UA: NEGATIVE
LEUKOCYTES UA: NEGATIVE
NITRITE UA: NEGATIVE
PH UA: 5
Protein, UA: NEGATIVE
Spec Grav, UA: 1.015
Urobilinogen, UA: 0.2

## 2014-09-03 LAB — CBC
HCT: 40.7 % (ref 36.0–46.0)
Hemoglobin: 13.3 g/dL (ref 12.0–15.0)
MCH: 27.7 pg (ref 26.0–34.0)
MCHC: 32.7 g/dL (ref 30.0–36.0)
MCV: 84.8 fL (ref 78.0–100.0)
MPV: 10.2 fL (ref 8.6–12.4)
Platelets: 209 10*3/uL (ref 150–400)
RBC: 4.8 MIL/uL (ref 3.87–5.11)
RDW: 13.4 % (ref 11.5–15.5)
WBC: 5.8 10*3/uL (ref 4.0–10.5)

## 2014-09-03 LAB — COMPREHENSIVE METABOLIC PANEL
ALT: 16 U/L (ref 0–35)
AST: 17 U/L (ref 0–37)
Albumin: 3.7 g/dL (ref 3.5–5.2)
Alkaline Phosphatase: 89 U/L (ref 39–117)
BUN: 17 mg/dL (ref 6–23)
CALCIUM: 9.2 mg/dL (ref 8.4–10.5)
CHLORIDE: 102 meq/L (ref 96–112)
CO2: 28 meq/L (ref 19–32)
Creat: 0.72 mg/dL (ref 0.50–1.10)
Glucose, Bld: 93 mg/dL (ref 70–99)
Potassium: 5 mEq/L (ref 3.5–5.3)
SODIUM: 139 meq/L (ref 135–145)
Total Bilirubin: 0.3 mg/dL (ref 0.2–1.2)
Total Protein: 6.6 g/dL (ref 6.0–8.3)

## 2014-09-03 LAB — RHEUMATOID FACTOR

## 2014-09-03 LAB — TSH: TSH: 0.664 u[IU]/mL (ref 0.350–4.500)

## 2014-09-03 LAB — POCT GLYCOSYLATED HEMOGLOBIN (HGB A1C): Hemoglobin A1C: 5.7

## 2014-09-03 LAB — C-REACTIVE PROTEIN

## 2014-09-03 LAB — GLUCOSE, POCT (MANUAL RESULT ENTRY): POC GLUCOSE: 100 mg/dL — AB (ref 70–99)

## 2014-09-03 LAB — VITAMIN B12: VITAMIN B 12: 626 pg/mL (ref 211–911)

## 2014-09-03 MED ORDER — PREDNISONE 10 MG PO TABS: ORAL_TABLET | ORAL | Status: DC

## 2014-09-03 MED ORDER — ZOSTER VACCINE LIVE 19400 UNT/0.65ML ~~LOC~~ SOLR: 0.6500 mL | Freq: Once | SUBCUTANEOUS | Status: DC

## 2014-09-03 NOTE — Patient Instructions (Signed)
Check witb your health insurance to make sure they cover a tetanus shot once every 10 years.  If not, come in to see [place] immediately if you get cut, bit, or other injury.  Neuropathic Pain We often think that pain has a physical cause. If we get rid of the cause, the pain should go away. Nerves themselves can also cause pain. It is called neuropathic pain, which means nerve abnormality. It may be difficult for the patients who have it and for the treating caregivers. Pain is usually described as acute (short-lived) or chronic (long-lasting). Acute pain is related to the physical sensations caused by an injury. It can last from a few seconds to many weeks, but it usually goes away when normal healing occurs. Chronic pain lasts beyond the typical healing time. With neuropathic pain, the nerve fibers themselves may be damaged or injured. They then send incorrect signals to other pain centers. The pain you feel is real, but the cause is not easy to find.  CAUSES  Chronic pain can result from diseases, such as diabetes and shingles (an infection related to chickenpox), or from trauma, surgery, or amputation. It can also happen without any known injury or disease. The nerves are sending pain messages, even though there is no identifiable cause for such messages.   Other common causes of neuropathy include diabetes, phantom limb pain, or Regional Pain Syndrome (RPS).  As with all forms of chronic back pain, if neuropathy is not correctly treated, there can be a number of associated problems that lead to a downward cycle for the patient. These include depression, sleeplessness, feelings of fear and anxiety, limited social interaction and inability to do normal daily activities or work.  The most dramatic and mysterious example of neuropathic pain is called "phantom limb syndrome." This occurs when an arm or a leg has been removed because of illness or injury. The brain still gets pain messages from the nerves that  originally carried impulses from the missing limb. These nerves now seem to misfire and cause troubling pain.  Neuropathic pain often seems to have no cause. It responds poorly to standard pain treatment. Neuropathic pain can occur after:  Shingles (herpes zoster virus infection).  A lasting burning sensation of the skin, caused usually by injury to a peripheral nerve.  Peripheral neuropathy which is widespread nerve damage, often caused by diabetes or alcoholism.  Phantom limb pain following an amputation.  Facial nerve problems (trigeminal neuralgia).  Multiple sclerosis.  Reflex sympathetic dystrophy.  Pain which comes with cancer and cancer chemotherapy.  Entrapment neuropathy such as when pressure is put on a nerve such as in carpal tunnel syndrome.  Back, leg, and hip problems (sciatica).  Spine or back surgery.  HIV Infection or AIDS where nerves are infected by viruses. Your caregiver can explain items in the above list which may apply to you. SYMPTOMS  Characteristics of neuropathic pain are:  Severe, sharp, electric shock-like, shooting, lightening-like, knife-like.  Pins and needles sensation.  Deep burning, deep cold, or deep ache.  Persistent numbness, tingling, or weakness.  Pain resulting from light touch or other stimulus that would not usually cause pain.  Increased sensitivity to something that would normally cause pain, such as a pinprick. Pain may persist for months or years following the healing of damaged tissues. When this happens, pain signals no longer sound an alarm about current injuries or injuries about to happen. Instead, the alarm system itself is not working correctly.  Neuropathic pain may  get worse instead of better over time. For some people, it can lead to serious disability. It is important to be aware that severe injury in a limb can occur without a proper, protective pain response.Burns, cuts, and other injuries may go unnoticed.  Without proper treatment, these injuries can become infected or lead to further disability. Take any injury seriously, and consult your caregiver for treatment. DIAGNOSIS  When you have a pain with no known cause, your caregiver will probably ask some specific questions:   Do you have any other conditions, such as diabetes, shingles, multiple sclerosis, or HIV infection?  How would you describe your pain? (Neuropathic pain is often described as shooting, stabbing, burning, or searing.)  Is your pain worse at any time of the day? (Neuropathic pain is usually worse at night.)  Does the pain seem to follow a certain physical pathway?  Does the pain come from an area that has missing or injured nerves? (An example would be phantom limb pain.)  Is the pain triggered by minor things such as rubbing against the sheets at night? These questions often help define the type of pain involved. Once your caregiver knows what is happening, treatment can begin. Anticonvulsant, antidepressant drugs, and various pain relievers seem to work in some cases. If another condition, such as diabetes is involved, better management of that disorder may relieve the neuropathic pain.  TREATMENT  Neuropathic pain is frequently long-lasting and tends not to respond to treatment with narcotic type pain medication. It may respond well to other drugs such as antiseizure and antidepressant medications. Usually, neuropathic problems do not completely go away, but partial improvement is often possible with proper treatment. Your caregivers have large numbers of medications available to treat you. Do not be discouraged if you do not get immediate relief. Sometimes different medications or a combination of medications will be tried before you receive the results you are hoping for. See your caregiver if you have pain that seems to be coming from nowhere and does not go away. Help is available.  SEEK IMMEDIATE MEDICAL CARE IF:   There  is a sudden change in the quality of your pain, especially if the change is on only one side of the body.  You notice changes of the skin, such as redness, black or purple discoloration, swelling, or an ulcer.  You cannot move the affected limbs. Document Released: 02/23/2004 Document Revised: 08/20/2011 Document Reviewed: 02/23/2004 [facility] Patient Information 2015 [place], [place]. This information is not intended to replace advice given to you by your health care provider. Make sure you discuss any questions you have with your health care provider.  Arthritis, Nonspecific Arthritis is inflammation of a joint. This usually means pain, redness, warmth or swelling are present. One or more joints may be involved. There are a number of types of arthritis. Your caregiver may not be able to tell what type of arthritis you have right away. CAUSES  The most common cause of arthritis is the wear and tear on the joint (osteoarthritis). This causes damage to the cartilage, which can break down over time. The knees, hips, back and neck are most often affected by this type of arthritis. Other types of arthritis and common causes of joint pain include:  Sprains and other injuries near the joint. Sometimes minor sprains and injuries cause pain and swelling that develop hours later.  Rheumatoid arthritis. This affects hands, feet and knees. It usually affects both sides of your body at the same time.  It is often associated with chronic ailments, fever, weight loss and general weakness.  Crystal arthritis. Gout and pseudo gout can cause occasional acute severe pain, redness and swelling in the foot, ankle, or knee.  Infectious arthritis. Bacteria can get into a joint through a break in overlying skin. This can cause infection of the joint. Bacteria and viruses can also spread through the blood and affect your joints.  Drug, infectious and allergy reactions. Sometimes joints can become mildly painful and  slightly swollen with these types of illnesses. SYMPTOMS   Pain is the main symptom.  Your joint or joints can also be red, swollen and warm or hot to the touch.  You may have a fever with certain types of arthritis, or even feel overall ill.  The joint with arthritis will hurt with movement. Stiffness is present with some types of arthritis. DIAGNOSIS  Your caregiver will suspect arthritis based on your description of your symptoms and on your exam. Testing may be needed to find the type of arthritis:  Blood and sometimes urine tests.  X-ray tests and sometimes CT or MRI scans.  Removal of fluid from the joint (arthrocentesis) is done to check for bacteria, crystals or other causes. Your caregiver (or a specialist) will numb the area over the joint with a local anesthetic, and use a needle to remove joint fluid for examination. This procedure is only minimally uncomfortable.  Even with these tests, your caregiver may not be able to tell what kind of arthritis you have. Consultation with a specialist (rheumatologist) may be helpful. TREATMENT  Your caregiver will discuss with you treatment specific to your type of arthritis. If the specific type cannot be determined, then the following general recommendations may apply. Treatment of severe joint pain includes:  Rest.  Elevation.  Anti-inflammatory medication (for example, ibuprofen) may be prescribed. Avoiding activities that cause increased pain.  Only take over-the-counter or prescription medicines for pain and discomfort as recommended by your caregiver.  Cold packs over an inflamed joint may be used for 10 to 15 minutes every hour. Hot packs sometimes feel better, but do not use overnight. Do not use hot packs if you are diabetic without your caregiver's permission.  A cortisone shot into arthritic joints may help reduce pain and swelling.  Any acute arthritis that gets worse over the next 1 to 2 days needs to be looked at to  be sure there is no joint infection. Long-term arthritis treatment involves modifying activities and lifestyle to reduce joint stress jarring. This can include weight loss. Also, exercise is needed to nourish the joint cartilage and remove waste. This helps keep the muscles around the joint strong. HOME CARE INSTRUCTIONS   Do not take aspirin to relieve pain if gout is suspected. This elevates uric acid levels.  Only take over-the-counter or prescription medicines for pain, discomfort or fever as directed by your caregiver.  Rest the joint as much as possible.  If your joint is swollen, keep it elevated.  Use crutches if the painful joint is in your leg.  Drinking plenty of fluids may help for certain types of arthritis.  Follow your caregiver's dietary instructions.  Try low-impact exercise such as:  Swimming.  Water aerobics.  Biking.  Walking.  Morning stiffness is often relieved by a warm shower.  Put your joints through regular range-of-motion. SEEK MEDICAL CARE IF:   You do not feel better in 24 hours or are getting worse.  You have side effects to medications,  or are not getting better with treatment. SEEK IMMEDIATE MEDICAL CARE IF:   You have a fever.  You develop severe joint pain, swelling or redness.  Many joints are involved and become painful and swollen.  There is severe back pain and/or leg weakness.  You have loss of bowel or bladder control. Document Released: 07/05/2004 Document Revised: 08/20/2011 Document Reviewed: 07/21/2008 [facility] Patient Information 2015 [place], [place]. This information is not intended to replace advice given to you by your health care provider. Make sure you discuss any questions you have with your health care provider.  Health Maintenance Adopting a healthy lifestyle and getting preventive care can go a long way to promote health and wellness. Talk with your health care provider about what schedule of regular examinations  is right for you. This is a good chance for you to check in with your provider about disease prevention and staying healthy. In between checkups, there are plenty of things you can do on your own. Experts have done a lot of research about which lifestyle changes and preventive measures are most likely to keep you healthy. Ask your health care provider for more information. WEIGHT AND DIET  Eat a healthy diet  Be sure to include plenty of vegetables, fruits, low-fat dairy products, and lean protein.  Do not eat a lot of foods high in solid fats, added sugars, or salt.  Get regular exercise. This is one of the most important things you can do for your health.  Most adults should exercise for at least 150 minutes each week. The exercise should increase your heart rate and make you sweat (moderate-intensity exercise).  Most adults should also do strengthening exercises at least twice a week. This is in addition to the moderate-intensity exercise.  Maintain a healthy weight  Body mass index (BMI) is a measurement that can be used to identify possible weight problems. It estimates body fat based on height and weight. Your health care provider can help determine your BMI and help you achieve or maintain a healthy weight.  For females 17 years of age and older:   A BMI below 18.5 is considered underweight.  A BMI of 18.5 to 24.9 is normal.  A BMI of 25 to 29.9 is considered overweight.  A BMI of 30 and above is considered obese.  Watch levels of cholesterol and blood lipids  You should start having your blood tested for lipids and cholesterol at 66 years of age, then have this test every 5 years.  You may need to have your cholesterol levels checked more often if:  Your lipid or cholesterol levels are high.  You are older than 66 years of age.  You are at high risk for heart disease.  CANCER SCREENING   Lung Cancer  Lung cancer screening is recommended for adults 7-80 years  old who are at high risk for lung cancer because of a history of smoking.  A yearly low-dose CT scan of the lungs is recommended for people who:  Currently smoke.  Have quit within the past 15 years.  Have at least a 30-pack-year history of smoking. A pack year is smoking an average of one pack of cigarettes a day for 1 year.  Yearly screening should continue until it has been 15 years since you quit.  Yearly screening should stop if you develop a health problem that would prevent you from having lung cancer treatment.  Breast Cancer  Practice breast self-awareness. This means understanding how your breasts  normally appear and feel.  It also means doing regular breast self-exams. Let your health care provider know about any changes, no matter how small.  If you are in your 20s or 30s, you should have a clinical breast exam (CBE) by a health care provider every 1-3 years as part of a regular health exam.  If you are 46 or older, have a CBE every year. Also consider having a breast X-ray (mammogram) every year.  If you have a family history of breast cancer, talk to your health care provider about genetic screening.  If you are at high risk for breast cancer, talk to your health care provider about having an MRI and a mammogram every year.  Breast cancer gene (BRCA) assessment is recommended for women who have family members with BRCA-related cancers. BRCA-related cancers include:  Breast.  Ovarian.  Tubal.  Peritoneal cancers.  Results of the assessment will determine the need for genetic counseling and BRCA1 and BRCA2 testing. Cervical Cancer Routine pelvic examinations to screen for cervical cancer are no longer recommended for nonpregnant women who are considered low risk for cancer of the pelvic organs (ovaries, uterus, and vagina) and who do not have symptoms. A pelvic examination may be necessary if you have symptoms including those associated with pelvic infections. Ask  your health care provider if a screening pelvic exam is right for you.   The Pap test is the screening test for cervical cancer for women who are considered at risk.  If you had a hysterectomy for a problem that was not cancer or a condition that could lead to cancer, then you no longer need Pap tests.  If you are older than 65 years, and you have had normal Pap tests for the past 10 years, you no longer need to have Pap tests.  If you have had past treatment for cervical cancer or a condition that could lead to cancer, you need Pap tests and screening for cancer for at least 20 years after your treatment.  If you no longer get a Pap test, assess your risk factors if they change (such as having a new sexual partner). This can affect whether you should start being screened again.  Some women have medical problems that increase their chance of getting cervical cancer. If this is the case for you, your health care provider may recommend more frequent screening and Pap tests.  The human papillomavirus (HPV) test is another test that may be used for cervical cancer screening. The HPV test looks for the virus that can cause cell changes in the cervix. The cells collected during the Pap test can be tested for HPV.  The HPV test can be used to screen women 65 years of age and older. Getting tested for HPV can extend the interval between normal Pap tests from three to five years.  An HPV test also should be used to screen women of any age who have unclear Pap test results.  After 66 years of age, women should have HPV testing as often as Pap tests.  Colorectal Cancer  This type of cancer can be detected and often prevented.  Routine colorectal cancer screening usually begins at 66 years of age and continues through 66 years of age.  Your health care provider may recommend screening at an earlier age if you have risk factors for colon cancer.  Your health care provider may also recommend using  home test kits to check for hidden blood in the stool.  A small camera at the end of a tube can be used to examine your colon directly (sigmoidoscopy or colonoscopy). This is done to check for the earliest forms of colorectal cancer.  Routine screening usually begins at age 68.  Direct examination of the colon should be repeated every 5-10 years through 66 years of age. However, you may need to be screened more often if early forms of precancerous polyps or small growths are found. Skin Cancer  Check your skin from head to toe regularly.  Tell your health care provider about any new moles or changes in moles, especially if there is a change in a mole's shape or color.  Also tell your health care provider if you have a mole that is larger than the size of a pencil eraser.  Always use sunscreen. Apply sunscreen liberally and repeatedly throughout the day.  Protect yourself by wearing long sleeves, pants, a wide-brimmed hat, and sunglasses whenever you are outside. HEART DISEASE, DIABETES, AND HIGH BLOOD PRESSURE   Have your blood pressure checked at least every 1-2 years. High blood pressure causes heart disease and increases the risk of stroke.  If you are between 67 years and 54 years old, ask your health care provider if you should take aspirin to prevent strokes.  Have regular diabetes screenings. This involves taking a blood sample to check your fasting blood sugar level.  If you are at a normal weight and have a low risk for diabetes, have this test once every three years after 66 years of age.  If you are overweight and have a high risk for diabetes, consider being tested at a younger age or more often. PREVENTING INFECTION  Hepatitis B  If you have a higher risk for hepatitis B, you should be screened for this virus. You are considered at high risk for hepatitis B if:  You were born in a country where hepatitis B is common. Ask your health care provider which countries are  considered high risk.  Your parents were born in a high-risk country, and you have not been immunized against hepatitis B (hepatitis B vaccine).  You have HIV or AIDS.  You use needles to inject street drugs.  You live with someone who has hepatitis B.  You have had sex with someone who has hepatitis B.  You get hemodialysis treatment.  You take certain medicines for conditions, including cancer, organ transplantation, and autoimmune conditions. Hepatitis C  Blood testing is recommended for:  Everyone born from [birth year] through 1967.  Anyone with known risk factors for hepatitis C. Sexually transmitted infections (STIs)  You should be screened for sexually transmitted infections (STIs) including gonorrhea and chlamydia if:  You are sexually active and are younger than 66 years of age.  You are older than 66 years of age and your health care provider tells you that you are at risk for this type of infection.  Your sexual activity has changed since you were last screened and you are at an increased risk for chlamydia or gonorrhea. Ask your health care provider if you are at risk.  If you do not have HIV, but are at risk, it may be recommended that you take a prescription medicine daily to prevent HIV infection. This is called pre-exposure prophylaxis (PrEP). You are considered at risk if:  You are sexually active and do not regularly use condoms or know the HIV status of your partner(s).  You take drugs by injection.  You are sexually active with  a partner who has HIV. Talk with your health care provider about whether you are at high risk of being infected with HIV. If you choose to begin PrEP, you should first be tested for HIV. You should then be tested every 3 months for as long as you are taking PrEP.  PREGNANCY   If you are premenopausal and you may become pregnant, ask your health care provider about preconception counseling.  If you may become pregnant, take 400 to 800  micrograms (mcg) of folic acid every day.  If you want to prevent pregnancy, talk to your health care provider about birth control (contraception). OSTEOPOROSIS AND MENOPAUSE   Osteoporosis is a disease in which the bones lose minerals and strength with aging. This can result in serious bone fractures. Your risk for osteoporosis can be identified using a bone density scan.  If you are 60 years of age or older, or if you are at risk for osteoporosis and fractures, ask your health care provider if you should be screened.  Ask your health care provider whether you should take a calcium or vitamin D supplement to lower your risk for osteoporosis.  Menopause may have certain physical symptoms and risks.  Hormone replacement therapy may reduce some of these symptoms and risks. Talk to your health care provider about whether hormone replacement therapy is right for you.  HOME CARE INSTRUCTIONS   Schedule regular health, dental, and eye exams.  Stay current with your immunizations.   Do not use any tobacco products including cigarettes, chewing tobacco, or electronic cigarettes.  If you are pregnant, do not drink alcohol.  If you are breastfeeding, limit how much and how often you drink alcohol.  Limit alcohol intake to no more than 1 drink per day for nonpregnant women. One drink equals 12 ounces of beer, 5 ounces of wine, or 1 ounces of hard liquor.  Do not use street drugs.  Do not share needles.  Ask your health care provider for help if you need support or information about quitting drugs.  Tell your health care provider if you often feel depressed.  Tell your health care provider if you have ever been abused or do not feel safe at home. Document Released: 12/11/2010 Document Revised: 10/12/2013 Document Reviewed: 04/29/2013 [facility] Patient Information 2015 [place], [place]. This information is not intended to replace advice given to you by your health care provider. Make sure  you discuss any questions you have with your health care provider.

## 2014-09-03 NOTE — Progress Notes (Signed)
Subjective:    Patient ID: Nicole Delgado, female    DOB: 03-21-1951, 64 y.o.   MRN: [id number] This chart was scribed for Delman Cheadle, MD by Zola Button, Medical Scribe. This patient was seen in Room 24 and the patient's care was started at 3:10 PM.   Chief Complaint  Patient presents with  . Annual Exam    no pap    HPI HPI Comments: Nicole Delgado is a 64 y.o. female with a hx of keratoconus who presents to the [facility] for a complete physical exam.  She recently transferred her medical care to our clinic. She saw Dr. Deatra Ina for screening colonoscopy May, 2014. Non-cancerous polyps were found, so she was instructed to repeat in 5 years. She does not need any further pap smear as she had a hysterectomy for benign reasons. Patient had a mammogram at [facility] December, 2013. Last pap smear was in 2008. Hysterectomy was in 1995 for fibroids; she still has her ovaries. She has a history of a hepatic adenoma. [place] November, 2014 showed 2 liver lesions, suspected benign adenomas. She had mammogram at the breast center 1 year prior. No prior immunizations on record.  Eye: Patient was diagnosed with petreu keratoconus in 1986 and is legally blind. She wears hard lenses and has been on a waiting list for cornea transplant for several years. Her eye doctor is Dr. Nolon Bussing at [facility].  Left Arm/Leg Tingling: Patient reports having constant tingling in her left leg and left arm to her hand that started several months ago. She also has a burning pain in her left arm; her arm symptoms are worse than her leg symptoms. She does note sleeping on her left side. Patient has not been medically evaluated for this before. She did have a fracture in her left foot in October, 2015. She also had a rotator cuff tear to her right shoulder 1 year ago, which she had surgery for on 09/02/13. She had been seen for a cyst in her left hand about 2 months ago. Patient denies weakness, leg swelling, neck pain, neck  injuries, and any symptoms in her right arm. She also denies hx of wrist injury. She does spend a lot of time on the computer, but the symptoms are not worsened with computer use. FMHx of arthritis in her older sister. She has not had any bone density scans done.  Immunizations: She has not had the shingles vaccine. She does not want the flu vaccine ever.  She has not had any pneumonia vaccines and is a little reluctant.  Thinks her last tdap was prob at least 40 yrs prior  Vitamins/Supplements/Medications: She does not take any multivitamins. She is not taking the glucosamine. Patient is on trazodone for sleep.  Exercise: Patient started running when she turned 88 and has been running marathons. However, she has not been running since her foot injury, but she has been trying to walk 7 miles every other day. She hopes to continue running after her foot is fully recovered.  Diet: Patient stopped eating meat 27 years ago, but she does eat salmon a few times a week. She also gets protein from beans, peanuts, eggs and cheese. She is lactose intolerant and drinks low-calorie almond milk.  Hepatitis C: Patient has been exposed to hepatitis C. She was tested negative for hepatitis C, but positive for hepatitis C exposure.  Back: Patient does have history of a broken back over 15 years ago. She did have  disc problems, including slipped disc (3rd lumbar). She did see a specialist and have it snapped back in and was told she may need surgery in the future. However, since she started walking and running, she has not had any problems with her back.  Dentist: Carmelia Roller Ortho: Letta Moynahan Gynecologist: Dr. Baird Cancer (retired)  Past Medical History  Diagnosis Date  . Allergy   . Asthma    Past Surgical History  Procedure Laterality Date  . Breast surgery      breast biopies x4  . Abdominal hysterectomy    . Rotator cuff surgery     Family History  Problem Relation Age of Onset  . Hypertension  Sister   . Colon cancer Neg Hx   . Alzheimer's disease Mother   . Heart Problems Father   . Heart disease Father   . Cancer Brother     History   Social History  . Marital Status: Divorced    Spouse Name: N/A  . Number of Children: N/A  . Years of Education: N/A   Occupational History  . Realtor    Social History Main Topics  . Smoking status: Never Smoker   . Smokeless tobacco: Never Used  . Alcohol Use: No  . Drug Use: No  . Sexual Activity: Yes    Birth Control/ Protection: Surgical   Other Topics Concern  . Not on file   Social History Narrative    Current Outpatient Prescriptions on File Prior to Visit  Medication Sig Dispense Refill  . albuterol (PROVENTIL HFA;VENTOLIN HFA) 108 (90 BASE) MCG/ACT inhaler Inhale 2 puffs into the lungs every 6 (six) hours as needed for wheezing or shortness of breath. 1 Inhaler 0  . GLUCOSAMINE-CHONDROITIN PO Take 1 tablet by mouth 2 (two) times daily with a meal.    . traZODone (DESYREL) 50 MG tablet Take 1 tablet (50 mg total) by mouth at bedtime as needed for sleep. 30 tablet 3   No current facility-administered medications on file prior to visit.   Allergies  Allergen Reactions  . Bee Venom Swelling  . Penicillins Hives  . Valium [Diazepam] Other (See Comments)    Hallucinations    Review of Systems  Eyes: Positive for visual disturbance.  Cardiovascular: Negative for leg swelling.  Genitourinary: Negative for vaginal bleeding, vaginal discharge and vaginal pain.  Musculoskeletal: Negative for back pain and neck pain.  Neurological: Negative for weakness.  All other systems reviewed and are negative.      Objective:  BP 138/84 mmHg  Pulse 70  Temp(Src) 97.5 F (36.4 C) (Oral)  Resp 16  Ht 5\' 5"  (1.651 m)  Wt 166 lb (75.297 kg)  BMI 27.62 kg/m2  SpO2 99%  Physical Exam  Constitutional: She is oriented to person, place, and time. She appears well-developed and well-nourished. No distress.  HENT:  Head:  Normocephalic and atraumatic.  Right Ear: Tympanic membrane, external ear and ear canal normal.  Left Ear: Tympanic membrane, external ear and ear canal normal.  Nose: Nose normal.  Mouth/Throat: Oropharynx is clear and moist and mucous membranes are normal. No oropharyngeal exudate, posterior oropharyngeal edema, posterior oropharyngeal erythema or tonsillar abscesses.  Eyes: Pupils are equal, round, and reactive to light.  Neck: Normal range of motion. Neck supple. No spinous process tenderness and no muscular tenderness present. No thyromegaly present.  Normal thyroid.  Cardiovascular: Normal rate, regular rhythm, S1 normal, S2 normal and normal heart sounds.   No murmur heard. 2+ radial and ulnar pulses.  Pulmonary/Chest: Effort normal and breath sounds normal. No respiratory distress. She has no wheezes. She has no rales.  CTAB.  Abdominal: Soft. Bowel sounds are normal. She exhibits no distension. There is no hepatosplenomegaly. There is no tenderness.  Genitourinary: No breast swelling, tenderness, discharge or bleeding.  No axillary adenopathy. Fibrocystic breasts bilaterally.  Musculoskeletal:       Cervical back: She exhibits normal range of motion, no tenderness, no bony tenderness, no deformity, no pain and no spasm.  Most swelling and enlargement of the 1st MCPs, but also with bilateral 5th DIPs and left 2nd DIP. Bony callus proximal 5th metatarsal, non-tender to palpation.  Lymphadenopathy:    She has no cervical adenopathy.    She has no axillary adenopathy.       Right: No supraclavicular adenopathy present.       Left: No supraclavicular adenopathy present.  Neurological: She is alert and oriented to person, place, and time. No cranial nerve deficit.  Reflex Scores:      Tricep reflexes are 2+ on the right side and 2+ on the left side.      Bicep reflexes are 2+ on the right side and 2+ on the left side.      Brachioradialis reflexes are 2+ on the right side and 2+ on the  left side.      Patellar reflexes are 2+ on the right side and 2+ on the left side.      Achilles reflexes are 2+ on the right side and 2+ on the left side. Negative Tinel's. Negative Phalen's and reverse Phalen's. Neg Spurlings.  Skin: Skin is warm and dry. No rash noted.  Psychiatric: She has a normal mood and affect. Her behavior is normal.  Vitals reviewed.  [facility] (PRIMARY) x-ray report read by Dr. Brigitte Pulse:    Left hand - Question of a bony growth in distal 1st metacarpal. Arthritic change with bone spurs and joint degradation at DIP joints, consistent with osteoarthritis.  Cervical spine - Minimal DDD and reactive bony growth at C4-5 and less at C3-4. Vertebral alignment normal.  Dg Cervical Spine 2 Or 3 Views  09/03/2014   CLINICAL DATA:  Neuropathy, left arm tingling  EXAM: CERVICAL SPINE - 2-3 VIEW  COMPARISON:  05/19/2013  FINDINGS: Two views of cervical spine submitted. No acute fracture or subluxation. Alignment and vertebral body heights are preserved. Mild disc space flattening with anterior spurring at C5-C6 level. No prevertebral soft tissue swelling. Cervical airway is patent.  IMPRESSION: No acute fracture or subluxation. Mild disc space flattening with anterior spurring at C5-C6 level.   Electronically Signed   By: Lahoma Crocker M.D.   On: 09/03/2014 17:50   Dg Hand 2 View Left  09/03/2014   CLINICAL DATA:  Pain first and fifth digit  EXAM: LEFT HAND - 2 VIEW  COMPARISON:  None.  FINDINGS: Two views of left hand submitted. No acute fracture or subluxation. Mild narrowing of radiocarpal joint space. Minimal degenerative changes proximal interphalangeal joint. Degenerative changes distal interphalangeal joints second, third, fourth and fifth finger. Most significant in second and fifth finger.  IMPRESSION: No acute fracture or subluxation. Osteoarthritic changes as described above.   Electronically Signed   By: Lahoma Crocker M.D.   On: 09/03/2014 17:52       Results for orders placed or  performed in visit on 09/03/14  RPR  Result Value Ref Range   RPR Ser Ql  NON REAC  HIV antibody  Result Value Ref Range  HIV 1&2 Ab, 4th Generation  NONREACTIVE  C-reactive protein  Result Value Ref Range   CRP <0.5 <0.60 mg/dL  Sedimentation rate  Result Value Ref Range   Sed Rate  0 - 30 mm/hr  Rheumatoid factor  Result Value Ref Range   Rhuematoid fact SerPl-aCnc <10 <=14 IU/mL  CBC  Result Value Ref Range   WBC 5.8 4.0 - 10.5 K/uL   RBC 4.80 3.87 - 5.11 MIL/uL   Hemoglobin 13.3 12.0 - 15.0 g/dL   HCT 40.7 36.0 - 46.0 %   MCV 84.8 78.0 - 100.0 fL   MCH 27.7 26.0 - 34.0 pg   MCHC 32.7 30.0 - 36.0 g/dL   RDW 13.4 11.5 - 15.5 %   Platelets 209 150 - 400 K/uL   MPV 10.2 8.6 - 12.4 fL  Vitamin B12  Result Value Ref Range   Vitamin B-12 626 211 - 911 pg/mL  Vit D  25 hydroxy (rtn osteoporosis monitoring)  Result Value Ref Range   Vit D, 25-Hydroxy  30 - 100 ng/mL  TSH  Result Value Ref Range   TSH 0.664 0.350 - 4.500 uIU/mL  Comprehensive metabolic panel  Result Value Ref Range   Sodium 139 135 - 145 mEq/L   Potassium 5.0 3.5 - 5.3 mEq/L   Chloride 102 96 - 112 mEq/L   CO2 28 19 - 32 mEq/L   Glucose, Bld 93 70 - 99 mg/dL   BUN 17 6 - 23 mg/dL   Creat 0.72 0.50 - 1.10 mg/dL   Total Bilirubin 0.3 0.2 - 1.2 mg/dL   Alkaline Phosphatase 89 39 - 117 U/L   AST 17 0 - 37 U/L   ALT 16 0 - 35 U/L   Total Protein 6.6 6.0 - 8.3 g/dL   Albumin 3.7 3.5 - 5.2 g/dL   Calcium 9.2 8.4 - 10.5 mg/dL  POCT urinalysis dipstick  Result Value Ref Range   Color, UA yellow    Clarity, UA clear    Glucose, UA neg    Bilirubin, UA neg    Ketones, UA neg    Spec Grav, UA 1.015    Blood, UA neg    pH, UA 5.0    Protein, UA neg    Urobilinogen, UA 0.2    Nitrite, UA neg    Leukocytes, UA Negative   POCT glucose (manual entry)  Result Value Ref Range   POC Glucose 100 (A) 70 - 99 mg/dl  POCT glycosylated hemoglobin (Hb A1C)  Result Value Ref Range   Hemoglobin A1C 5.7      Assessment & Plan:   Numbness and tingling in left upper extremity - suspect pinched left spinal nerve - poss around C5-6 as etiology so try taper of prednisone to provide relief.  If continues rec MRI of c-spine but may want to consider NCV/EMG or neurosurg referral - check labs to r/o systemic abnormality es etiology  Annual physical exam - Plan: POCT urinalysis dipstick, rec dexa scan, reviewed calcium/vit D/protein intake, resume aerobic exercise (marathons), colonoscopy UTD, pt refuses flu vaccine, colonoscopy done 2014 - repeat in 5 years  Neuropathy - Plan: RPR, HIV antibody, CBC, Vitamin B12, Vit D  25 hydroxy (rtn osteoporosis monitoring), TSH, Comprehensive metabolic panel, POCT glucose (manual entry), POCT glycosylated hemoglobin (Hb A1C), DG Cervical Spine 2 or 3 views  Fibrocystic breast changes of both breasts - s/p multiple breast biopsies, sched mammogram  Arthritis - Plan: C-reactive protein, Sedimentation rate, Rheumatoid factor, DG Hand  2 View Left - osteoarthritis - pt using curcumin for anti-inflammatory, cons trial of glucosamine-chondroiton and/or top capsaicin, keep up activity  Need for prophylactic vaccination against Streptococcus pneumoniae (pneumococcus) - Plan: Pneumococcal conjugate vaccine 13-valent IM - will need pneumovax in 1 yr  Need for zoster vaccination - rx given to get at outside pharmacy  Need for Tdap vaccination - has medicare so pt instructed to check on coverage with her supplemental before administering  Meds ordered this encounter  Medications  . zoster vaccine live, PF, (ZOSTAVAX) 24580 UNT/0.65ML injection    Sig: Inject 19,400 Units into the skin once.    Dispense:  1 vial    Refill:  0  . predniSONE (DELTASONE) 10 MG tablet    Sig: 6-5-4-3-2-1 tabs po qd    Dispense:  21 tablet    Refill:  0    I personally performed the services described in this documentation, which was scribed in my presence. The recorded information has been  reviewed and considered, and addended by me as needed.  Delman Cheadle, MD MPH

## 2014-09-03 NOTE — Progress Notes (Deleted)
   Subjective:    Patient ID: Nicole Delgado, female    DOB: [birth year], 64 y.o.   MRN: [id number]  HPI    Review of Systems  Constitutional: Negative.   HENT: Negative.   Eyes: Negative.   Respiratory: Negative.   Cardiovascular: Negative.   Gastrointestinal: Negative.   Endocrine: Negative.   Genitourinary: Negative.   Musculoskeletal: Negative.   Skin: Negative.   Allergic/Immunologic: Negative.   Neurological: Negative.   Hematological: Negative.   Psychiatric/Behavioral: Negative.        Objective:   Physical Exam        Assessment & Plan:

## 2014-09-04 ENCOUNTER — Encounter: Payer: Self-pay | Admitting: Family Medicine

## 2014-09-04 LAB — RPR

## 2014-09-04 LAB — HIV ANTIBODY (ROUTINE TESTING W REFLEX): HIV: NONREACTIVE

## 2014-09-04 LAB — VITAMIN D 25 HYDROXY (VIT D DEFICIENCY, FRACTURES): Vit D, 25-Hydroxy: 18 ng/mL — ABNORMAL LOW (ref 30–100)

## 2014-09-04 LAB — SEDIMENTATION RATE: SED RATE: 11 mm/h (ref 0–30)

## 2014-09-09 ENCOUNTER — Telehealth: Payer: Self-pay

## 2014-09-10 ENCOUNTER — Ambulatory Visit
Admission: RE | Admit: 2014-09-10 | Discharge: 2014-09-10 | Disposition: A | Discharge status: Home / Self Care | Payer: PPO | Source: Ambulatory Visit | Attending: Family Medicine | Admitting: Family Medicine

## 2014-09-10 ENCOUNTER — Other Ambulatory Visit: Payer: Self-pay | Admitting: Family Medicine

## 2014-09-10 DIAGNOSIS — Z1231 Encounter for screening mammogram for malignant neoplasm of breast: Secondary | ICD-10-CM

## 2014-09-16 ENCOUNTER — Ambulatory Visit
Admission: RE | Admit: 2014-09-16 | Discharge: 2014-09-16 | Disposition: A | Discharge status: Home / Self Care | Payer: PPO | Source: Ambulatory Visit | Attending: Family Medicine | Admitting: Family Medicine

## 2014-09-16 DIAGNOSIS — Z780 Asymptomatic menopausal state: Secondary | ICD-10-CM

## 2014-10-17 DIAGNOSIS — E559 Vitamin D deficiency, unspecified: Secondary | ICD-10-CM | POA: Insufficient documentation

## 2014-10-17 MED ORDER — ERGOCALCIFEROL 1.25 MG (50000 UT) PO CAPS: 50000.0000 [IU] | ORAL_CAPSULE | ORAL | Status: DC

## 2014-10-17 NOTE — Addendum Note (Signed)
Addended by: Delman Cheadle on: 10/17/2014 05:20 AM   Modules accepted: Orders, SmartSet

## 2014-11-06 ENCOUNTER — Other Ambulatory Visit: Payer: Self-pay | Admitting: Family Medicine

## 2014-11-06 DIAGNOSIS — M810 Age-related osteoporosis without current pathological fracture: Secondary | ICD-10-CM | POA: Insufficient documentation

## 2014-11-06 MED ORDER — ALENDRONATE SODIUM 70 MG PO TABS: 70.0000 mg | ORAL_TABLET | ORAL | Status: DC

## 2015-03-23 ENCOUNTER — Ambulatory Visit (INDEPENDENT_AMBULATORY_CARE_PROVIDER_SITE_OTHER): Payer: PPO

## 2015-03-23 ENCOUNTER — Ambulatory Visit (INDEPENDENT_AMBULATORY_CARE_PROVIDER_SITE_OTHER): Payer: PPO | Admitting: Family Medicine

## 2015-03-23 VITALS — BP 128/74 | HR 81 | Temp 97.6°F | Resp 16 | Ht 66.0 in | Wt 163.8 lb

## 2015-03-23 DIAGNOSIS — M542 Cervicalgia: Secondary | ICD-10-CM

## 2015-03-23 DIAGNOSIS — S139XXA Sprain of joints and ligaments of unspecified parts of neck, initial encounter: Secondary | ICD-10-CM | POA: Diagnosis not present

## 2015-03-23 DIAGNOSIS — M79672 Pain in left foot: Secondary | ICD-10-CM

## 2015-03-23 DIAGNOSIS — M810 Age-related osteoporosis without current pathological fracture: Secondary | ICD-10-CM | POA: Diagnosis not present

## 2015-03-23 MED ORDER — TRAMADOL HCL 50 MG PO TABS: 50.0000 mg | ORAL_TABLET | Freq: Four times a day (QID) | ORAL | Status: DC | PRN

## 2015-03-23 MED ORDER — CYCLOBENZAPRINE HCL 5 MG PO TABS: 5.0000 mg | ORAL_TABLET | Freq: Every evening | ORAL | Status: DC | PRN
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
## 2015-03-23 NOTE — Patient Instructions (Signed)
Tramadol tablets What is this medicine? TRAMADOL (TRA ma dole) is a pain reliever. It is used to treat moderate to severe pain in adults. This medicine may be used for other purposes; ask your health care provider or pharmacist if you have questions. What should I tell my health care provider before I take this medicine? They need to know if you have any of these conditions: -brain tumor -depression -drug abuse or addiction -head injury -if you frequently drink alcohol containing drinks -kidney disease or trouble passing urine -liver disease -lung disease, asthma, or breathing problems -seizures or epilepsy -suicidal thoughts, plans, or attempt; a previous suicide attempt by you or a family member -an unusual or allergic reaction to tramadol, codeine, other medicines, foods, dyes, or preservatives -pregnant or trying to get pregnant -breast-feeding How should I use this medicine? Take this medicine by mouth with a full glass of water. Follow the directions on the prescription label. If the medicine upsets your stomach, take it with food or milk. Do not take more medicine than you are told to take. Talk to your pediatrician regarding the use of this medicine in children. Special care may be needed. Overdosage: If you think you have taken too much of this medicine contact a poison control center or emergency room at once. NOTE: This medicine is only for you. Do not share this medicine with others. What if I miss a dose? If you miss a dose, take it as soon as you can. If it is almost time for your next dose, take only that dose. Do not take double or extra doses. What may interact with this medicine? Do not take this medicine with any of the following medications: -MAOIs like Carbex, Eldepryl, Marplan, Nardil, and Parnate This medicine may also interact with the following medications: -alcohol or medicines that contain alcohol -antihistamines -benzodiazepines -bupropion -carbamazepine  or oxcarbazepine -clozapine -cyclobenzaprine -digoxin -furazolidone -linezolid -medicines for depression, anxiety, or psychotic disturbances -medicines for migraine headache like almotriptan, eletriptan, frovatriptan, naratriptan, rizatriptan, sumatriptan, zolmitriptan -medicines for pain like pentazocine, buprenorphine, butorphanol, meperidine, nalbuphine, and propoxyphene -medicines for sleep -muscle relaxants -naltrexone -phenobarbital -phenothiazines like perphenazine, thioridazine, chlorpromazine, mesoridazine, fluphenazine, prochlorperazine, promazine, and trifluoperazine -procarbazine -warfarin This list may not describe all possible interactions. Give your health care provider a list of all the medicines, herbs, non-prescription drugs, or dietary supplements you use. Also tell them if you smoke, drink alcohol, or use illegal drugs. Some items may interact with your medicine. What should I watch for while using this medicine? Tell your doctor or health care professional if your pain does not go away, if it gets worse, or if you have new or a different type of pain. You may develop tolerance to the medicine. Tolerance means that you will need a higher dose of the medicine for pain relief. Tolerance is normal and is expected if you take this medicine for a long time. Do not suddenly stop taking your medicine because you may develop a severe reaction. Your body becomes used to the medicine. This does NOT mean you are addicted. Addiction is a behavior related to getting and using a drug for a non-medical reason. If you have pain, you have a medical reason to take pain medicine. Your doctor will tell you how much medicine to take. If your doctor wants you to stop the medicine, the dose will be slowly lowered over time to avoid any side effects. You may get drowsy or dizzy. Do not drive, use machinery, or do anything  that needs mental alertness until you know how this medicine affects you. Do  not stand or sit up quickly, especially if you are an older patient. This reduces the risk of dizzy or fainting spells. Alcohol can increase or decrease the effects of this medicine. Avoid alcoholic drinks. You may have constipation. Try to have a bowel movement at least every 2 to 3 days. If you do not have a bowel movement for 3 days, call your doctor or health care professional. Your mouth may get dry. Chewing sugarless gum or sucking hard candy, and drinking plenty of water may help. Contact your doctor if the problem does not go away or is severe. What side effects may I notice from receiving this medicine? Side effects that you should report to your doctor or health care professional as soon as possible: -allergic reactions like skin rash, itching or hives, swelling of the face, lips, or tongue -breathing difficulties, wheezing -confusion -itching -light headedness or fainting spells -redness, blistering, peeling or loosening of the skin, including inside the mouth -seizures Side effects that usually do not require medical attention (report to your doctor or health care professional if they continue or are bothersome): -constipation -dizziness -drowsiness -headache -nausea, vomiting This list may not describe all possible side effects. Call your doctor for medical advice about side effects. You may report side effects to FDA at 1-800-FDA-1088. Where should I keep my medicine? Keep out of the reach of children. This medicine may cause accidental overdose and death if it taken by other adults, children, or pets. Mix any unused medicine with a substance like cat litter or coffee grounds. Then throw the medicine away in a sealed container like a sealed bag or a coffee can with a lid. Do not use the medicine after the expiration date. Store at room temperature between 15 and 30 degrees C (59 and 86 degrees F). NOTE: This sheet is a summary. It may not cover all possible information. If you  have questions about this medicine, talk to your doctor, pharmacist, or health care provider.    2016, Elsevier/Gold Standard. (2013-07-24 15:42:09) Osteoporosis Osteoporosis is the thinning and loss of density in the bones. Osteoporosis makes the bones more brittle, fragile, and likely to break (fracture). Over time, osteoporosis can cause the bones to become so weak that they fracture after a simple fall. The bones most likely to fracture are the bones in the hip, wrist, and spine. CAUSES  The exact cause is not known. RISK FACTORS Anyone can develop osteoporosis. You may be at greater risk if you have a family history of the condition or have poor nutrition. You may also have a higher risk if you are:   Female.   78 years old or older.  A smoker.  Not physically active.   White or Asian.  Slender. SIGNS AND SYMPTOMS  A fracture might be the first sign of the disease, especially if it results from a fall or injury that would not usually cause a bone to break. Other signs and symptoms include:   Low back and neck pain.  Stooped posture.  Height loss. DIAGNOSIS  To make a diagnosis, your health care provider may:  Take a medical history.  Perform a physical exam.  Order tests, such as:  A bone mineral density test.  A dual-energy X-ray absorptiometry test. TREATMENT  The goal of osteoporosis treatment is to strengthen your bones to reduce your risk of a fracture. Treatment may involve:  Making lifestyle changes,  such as:  Eating a diet rich in calcium.  Doing weight-bearing and muscle-strengthening exercises.  Stopping tobacco use.  Limiting alcohol intake.  Taking medicine to slow the process of bone loss or to increase bone density.  Monitoring your levels of calcium and vitamin D. HOME CARE INSTRUCTIONS  Include calcium and vitamin D in your diet. Calcium is important for bone health, and vitamin D helps the body absorb calcium.  Perform  weight-bearing and muscle-strengthening exercises as directed by your health care provider.  Do not use any tobacco products, including cigarettes, chewing tobacco, and electronic cigarettes. If you need help quitting, ask your health care provider.  Limit your alcohol intake.  Take medicines only as directed by your health care provider.  Keep all follow-up visits as directed by your health care provider. This is important.  Take precautions at home to lower your risk of falling, such as:  Keeping rooms well lit and clutter free.  Installing safety rails on stairs.  Using rubber mats in the bathroom and other areas that are often wet or slippery. SEEK IMMEDIATE MEDICAL CARE IF:  You fall or injure yourself.    This information is not intended to replace advice given to you by your health care provider. Make sure you discuss any questions you have with your health care provider.   Document Released: 03/07/2005 Document Revised: 06/18/2014 Document Reviewed: 11/05/2013 Elsevier Interactive Patient Education [employer].

## 2015-03-31 ENCOUNTER — Ambulatory Visit (INDEPENDENT_AMBULATORY_CARE_PROVIDER_SITE_OTHER): Payer: PPO | Admitting: Family Medicine

## 2015-03-31 VITALS — BP 170/82 | HR 66 | Temp 98.7°F | Resp 18 | Ht 66.0 in | Wt 160.0 lb

## 2015-03-31 DIAGNOSIS — S5001XA Contusion of right elbow, initial encounter: Secondary | ICD-10-CM | POA: Diagnosis not present

## 2015-03-31 NOTE — Patient Instructions (Signed)
The elbow is healing normally and I don't anticipate any long-term consequences from the fall.

## 2015-03-31 NOTE — Progress Notes (Signed)
Subjective:  This chart was scribed for Robyn Haber MD, by Tamsen Roers, at [facility]  This patient was seen in room 12 and the patient's care was started at 12:54 PM.    Patient ID: Nicole Delgado, female    DOB: [birth year], 64 y.o.   MRN: [id number]  HPI  Chief Complaint  Patient presents with  . Fall    5 days ago at restaurant/ injured elbow    HPI Comments: Nicole Delgado is a 64 y.o. female who presents to the [facility] complaining of falling onset four days ago at the restaurant when she fell from a chair against a wall. She has a small healing scrape on her right elbow and states that her elbow feels a bit sore.  She is currently taking vitamin D, Fosamax, albuterol (when needed), and tramadol.  She takes her sleeping medication when she is unable to sleep and states that it may be the reason her blood pressure was high this morning.  Patient has a history of osteoperosis.  Blood pressure left arm: 138/70.     Patient Active Problem List   Diagnosis Date Noted  . Osteoporosis 11/06/2014  . Vitamin D deficiency 10/17/2014  . Hepatic adenoma 04/20/2013  . Chronic right shoulder pain 04/20/2013  . Insomnia 04/20/2013   Past Medical History  Diagnosis Date  . Allergy   . Asthma   . Keratoconus     followed by optho at [facility], on retinal transplant list, legally blind   Past Surgical History  Procedure Laterality Date  . Rotator cuff surgery    . Breast surgery      breast biopies x4  . Abdominal hysterectomy  1995   Allergies  Allergen Reactions  . Bee Venom Swelling  . Penicillins Hives  . Valium [Diazepam] Other (See Comments)    Hallucinations   Prior to Admission medications   Medication Sig Start Date End Date Taking? Authorizing Provider  albuterol (PROVENTIL HFA;VENTOLIN HFA) 108 (90 BASE) MCG/ACT inhaler Inhale 2 puffs into the lungs every 6 (six) hours as needed for wheezing or shortness of breath.  04/14/13  Yes Tresa Garter, MD  alendronate (FOSAMAX) 70 MG tablet Take 1 tablet (70 mg total) by mouth every 7 (seven) days. Take with a full glass of water on an empty stomach. 11/06/14  Yes Shawnee Knapp, MD  cyclobenzaprine (FLEXERIL) 5 MG tablet Take 1 tablet (5 mg total) by mouth at bedtime as needed. 03/23/15  Yes Thao P Le, DO  ergocalciferol (VITAMIN D2) 50000 UNITS capsule Take 1 capsule (50,000 Units total) by mouth once a week. 10/17/14  Yes Shawnee Knapp, MD  GLUCOSAMINE-CHONDROITIN PO Take 1 tablet by mouth 2 (two) times daily with a meal.   Yes Historical Provider, MD  traMADol (ULTRAM) 50 MG tablet Take 1 tablet (50 mg total) by mouth every 6 (six) hours as needed. 03/23/15  Yes Thao P Le, DO  traZODone (DESYREL) 50 MG tablet Take 1 tablet (50 mg total) by mouth at bedtime as needed for sleep. 07/16/14  Yes Posey Boyer, MD  zoster vaccine live, PF, (ZOSTAVAX) 05397 UNT/0.65ML injection Inject 19,400 Units into the skin once. Patient not taking: Reported on 03/23/2015 09/03/14   Shawnee Knapp, MD   Social History   Social History  . Marital Status: Divorced    Spouse Name: N/A  . Number of Children: N/A  . Years of Education: N/A  Occupational History  . Realtor    Social History Main Topics  . Smoking status: Never Smoker   . Smokeless tobacco: Never Used  . Alcohol Use: No  . Drug Use: No  . Sexual Activity: Yes    Birth Control/ Protection: Surgical   Other Topics Concern  . Not on file   Social History Narrative   Review of Systems  Constitutional: Negative for fever and chills.  Eyes: Negative for pain, redness and itching.  Respiratory: Negative for cough and choking.   Gastrointestinal: Negative for nausea and vomiting.  Musculoskeletal: Negative for neck pain and neck stiffness.  Skin: Positive for wound.  Neurological: Negative for syncope and speech difficulty.       Objective:   Physical Exam  Constitutional: She is oriented to person, place, and  time. She appears well-developed and well-nourished. No distress.  Eyes: Pupils are equal, round, and reactive to light.  Neck: Normal range of motion.  Pulmonary/Chest: No respiratory distress.  Musculoskeletal: Normal range of motion.  Neurological: She is alert and oriented to person, place, and time.  Skin: Skin is warm and dry.   right elbow shows 2 superficial narrow healing scrapes with some mild tenderness underneath and no ecchymosis. This no significant swelling and there is full range of motion  Filed Vitals:   03/31/15 1201 03/31/15 1225  BP: 170/84 170/82  Pulse: 66   Temp: 98.7 F (37.1 C)   TempSrc: Oral   Resp: 18   Height: 5\' 6"  (1.676 m)   Weight: 160 lb (72.576 kg)   SpO2: 98%          Assessment & Plan:   This chart was scribed in my presence and reviewed by me personally.    ICD-9-CM ICD-10-CM   1. Contusion of right elbow, initial encounter 923.11 S50.01XA      Signed, Robyn Haber, MD

## 2015-04-19 ENCOUNTER — Other Ambulatory Visit: Payer: Self-pay | Admitting: Internal Medicine

## 2015-04-26 ENCOUNTER — Other Ambulatory Visit: Payer: Self-pay | Admitting: Family Medicine

## 2015-05-03 ENCOUNTER — Other Ambulatory Visit: Payer: Self-pay | Admitting: *Deleted

## 2015-05-03 MED ORDER — ALBUTEROL SULFATE HFA 108 (90 BASE) MCG/ACT IN AERS: 2.0000 | INHALATION_SPRAY | Freq: Four times a day (QID) | RESPIRATORY_TRACT | Status: DC | PRN

## 2015-05-03 NOTE — Telephone Encounter (Signed)
Patients inhaler has been refilled. 

## 2015-06-30 ENCOUNTER — Emergency Department (HOSPITAL_COMMUNITY)
Admission: EM | Admit: 2015-06-30 | Discharge: 2015-06-30 | Disposition: A | Discharge status: Home / Self Care | Payer: PPO | Attending: Emergency Medicine | Admitting: Emergency Medicine

## 2015-06-30 ENCOUNTER — Emergency Department (HOSPITAL_COMMUNITY): Payer: PPO

## 2015-06-30 ENCOUNTER — Encounter (HOSPITAL_COMMUNITY): Payer: Self-pay | Admitting: Emergency Medicine

## 2015-06-30 DIAGNOSIS — Z880 Allergy status to penicillin: Secondary | ICD-10-CM | POA: Insufficient documentation

## 2015-06-30 DIAGNOSIS — R0602 Shortness of breath: Secondary | ICD-10-CM | POA: Diagnosis not present

## 2015-06-30 DIAGNOSIS — J45901 Unspecified asthma with (acute) exacerbation: Secondary | ICD-10-CM | POA: Insufficient documentation

## 2015-06-30 DIAGNOSIS — J40 Bronchitis, not specified as acute or chronic: Secondary | ICD-10-CM

## 2015-06-30 DIAGNOSIS — Z79899 Other long term (current) drug therapy: Secondary | ICD-10-CM | POA: Diagnosis not present

## 2015-06-30 DIAGNOSIS — R05 Cough: Secondary | ICD-10-CM | POA: Diagnosis not present

## 2015-06-30 MED ORDER — IPRATROPIUM-ALBUTEROL 0.5-2.5 (3) MG/3ML IN SOLN
3.0000 mL | Freq: Once | RESPIRATORY_TRACT | Status: AC
  Administered 2015-06-30: 3 mL via RESPIRATORY_TRACT

## 2015-06-30 MED ORDER — HYDROCODONE-HOMATROPINE 5-1.5 MG/5ML PO SYRP: 5.0000 mL | ORAL_SOLUTION | Freq: Four times a day (QID) | ORAL | Status: DC | PRN

## 2015-06-30 MED ORDER — IPRATROPIUM-ALBUTEROL 0.5-2.5 (3) MG/3ML IN SOLN
RESPIRATORY_TRACT | Status: AC
  Filled 2015-06-30: qty 3

## 2015-06-30 MED ORDER — ALBUTEROL SULFATE (2.5 MG/3ML) 0.083% IN NEBU
INHALATION_SOLUTION | RESPIRATORY_TRACT | Status: AC
  Filled 2015-06-30: qty 6

## 2015-06-30 MED ORDER — ALBUTEROL SULFATE (2.5 MG/3ML) 0.083% IN NEBU
5.0000 mg | INHALATION_SOLUTION | Freq: Once | RESPIRATORY_TRACT | Status: AC
  Administered 2015-06-30: 5 mg via RESPIRATORY_TRACT

## 2015-06-30 NOTE — Discharge Instructions (Signed)

## 2015-06-30 NOTE — ED Notes (Signed)
Pt with cough x 1 week worse with exertion and some wheezing noted; pt sts home inhaler not helping; pt sts productive cough with brown sputum

## 2015-06-30 NOTE — ED Provider Notes (Signed)
CSN: [id number]     Arrival date & time 06/30/15  0740 History   First MD Initiated Contact with Patient 06/30/15 [phone]     Chief Complaint  Patient presents with  . Cough  . Asthma   Patient is a 65 y.o. female presenting with cough and asthma. The history is provided by the patient.  Cough Cough characteristics:  Dry Severity:  Moderate Duration:  2 weeks Timing: the cough is now dry buit she cannot seem to stop coughing when she starts. Chronicity:  New Context comment:  She has not called her doctor but last night it was worse so she decided to come to the ED today Relieved by:  Nothing Worsened by:  Activity Ineffective treatments:  Cough suppressants Associated symptoms: wheezing   Associated symptoms: no chest pain, no fever, no rash and no shortness of breath   Asthma Pertinent negatives include no chest pain and no shortness of breath.    Past Medical History  Diagnosis Date  . Allergy   . Asthma   . Keratoconus     followed by optho at [facility], on retinal transplant list, legally blind   Past Surgical History  Procedure Laterality Date  . Rotator cuff surgery    . Breast surgery      breast biopies x4  . Abdominal hysterectomy  1995   Family History  Problem Relation Age of Onset  . Hypertension Sister   . Colon cancer Neg Hx   . Alzheimer's disease Mother   . Heart Problems Father   . Heart disease Father   . Cancer Brother    Social History  Substance Use Topics  . Smoking status: Never Smoker   . Smokeless tobacco: Never Used  . Alcohol Use: No   OB History    Gravida Para Term Preterm AB TAB SAB Ectopic Multiple Living   0         0     Review of Systems  Constitutional: Negative for fever.  Respiratory: Positive for cough and wheezing. Negative for shortness of breath.   Cardiovascular: Negative for chest pain.  Skin: Negative for rash.  All other systems reviewed and are negative.     Allergies  Bee venom; Penicillins; and  Valium  Home Medications   Prior to Admission medications   Medication Sig Start Date End Date Taking? Authorizing Provider  albuterol (PROVENTIL HFA;VENTOLIN HFA) 108 (90 BASE) MCG/ACT inhaler Inhale 2 puffs into the lungs every 6 (six) hours as needed for wheezing or shortness of breath. 05/03/15   Tresa Garter, MD  alendronate (FOSAMAX) 70 MG tablet Take 1 tablet (70 mg total) by mouth every 7 (seven) days. Take with a full glass of water on an empty stomach. 11/06/14   Shawnee Durene Dodge, MD  cyclobenzaprine (FLEXERIL) 5 MG tablet Take 1 tablet (5 mg total) by mouth at bedtime as needed. 03/23/15   Thao P Le, DO  GLUCOSAMINE-CHONDROITIN PO Take 1 tablet by mouth 2 (two) times daily with a meal.    Historical Provider, MD  HYDROcodone-homatropine (HYCODAN) 5-1.5 MG/5ML syrup Take 5 mLs by mouth every 6 (six) hours as needed for cough. 06/30/15   Dorie Rank, MD  traMADol (ULTRAM) 50 MG tablet Take 1 tablet (50 mg total) by mouth every 6 (six) hours as needed. 03/23/15   Thao P Le, DO  traZODone (DESYREL) 50 MG tablet Take 1 tablet (50 mg total) by mouth at bedtime as needed for sleep. 07/16/14   Shanon Brow  Burt Ek, MD  Vitamin D, Ergocalciferol, (DRISDOL) 50000 UNITS CAPS capsule TAKE 1 CAPSULE (50,000 UNITS TOTAL) BY MOUTH ONCE A WEEK  "OFFICE VISIT NEEDED FOR REFILLS" 04/26/15   Shawnee Elvan Ebron, MD  zoster vaccine live, PF, (ZOSTAVAX) 60454 UNT/0.65ML injection Inject 19,400 Units into the skin once. Patient not taking: Reported on 03/23/2015 09/03/14   Shawnee Casidee Jann, MD   BP 148/79 mmHg  Pulse 115  Temp(Src) 99.2 F (37.3 C) (Oral)  Resp 24  Wt 76.346 kg  SpO2 97% Physical Exam  Constitutional: She appears well-developed and well-nourished. No distress.  HENT:  Head: Normocephalic and atraumatic.  Right Ear: External ear normal.  Left Ear: External ear normal.  Eyes: Conjunctivae are normal. Right eye exhibits no discharge. Left eye exhibits no discharge. No scleral icterus.  Neck: Neck supple. No  tracheal deviation present.  Cardiovascular: Normal rate, regular rhythm and intact distal pulses.   Pulmonary/Chest: Effort normal and breath sounds normal. No stridor. No respiratory distress. She has no wheezes. She has no rales.  Abdominal: Soft. Bowel sounds are normal. She exhibits no distension. There is no tenderness. There is no rebound and no guarding.  Musculoskeletal: She exhibits no edema or tenderness.  Neurological: She is alert. She has normal strength. No cranial nerve deficit (no facial droop, extraocular movements intact, no slurred speech) or sensory deficit. She exhibits normal muscle tone. She displays no seizure activity. Coordination normal.  Skin: Skin is warm and dry. No rash noted.  Psychiatric: She has a normal mood and affect.  Nursing note and vitals reviewed.   ED Course  Procedures (including critical care time)  Imaging Review Dg Chest 2 View  06/30/2015  CLINICAL DATA:  Shortness of breath and cough for 2 weeks EXAM: CHEST  2 VIEW COMPARISON:  None. FINDINGS: Lungs are clear. Heart size and pulmonary vascularity are normal. No adenopathy. No bone lesions. IMPRESSION: No edema or consolidation. Electronically Signed   By: Lowella Grip III M.D.   On: 06/30/2015 08:38   I have personally reviewed and evaluated these images and lab results as part of my medical decision-making.    MDM   Final diagnoses:  Bronchitis    Symptoms are consistent with a bronchitis. There is no evidence to suggest pneumonia on my exam or cxr.. I discussed supportive treatment. I encouraged followup with the primary care doctor next week if symptoms have not resolved. Warning signs and reasons to return to the emergency room were discussed      Dorie Rank, MD 06/30/15 [phone]

## 2015-07-01 ENCOUNTER — Other Ambulatory Visit: Payer: Self-pay

## 2015-07-01 DIAGNOSIS — Z1231 Encounter for screening mammogram for malignant neoplasm of breast: Secondary | ICD-10-CM

## 2015-07-12 ENCOUNTER — Telehealth: Payer: Self-pay

## 2015-07-12 NOTE — Telephone Encounter (Signed)
Pt stopped in and needs a refill for vit D2-1.25mg  @ [facility]. She is going to call her pharmacy also. She can be reached @ [phone]. Thank you

## 2015-07-14 MED ORDER — VITAMIN D (ERGOCALCIFEROL) 1.25 MG (50000 UNIT) PO CAPS: ORAL_CAPSULE | ORAL | Status: DC

## 2015-07-14 NOTE — Telephone Encounter (Signed)
LMOM to call back if Rx vit D 50,000 units is not the correct strength. It is what we have in EPIC as pt's current Rx from [place]. I will send in 1 RF. Advised pt on VM that she is overdue for check up.

## 2015-07-14 NOTE — Telephone Encounter (Signed)
Pt called back and verified that I did send in the correct Rx strength. She also wanted to ask about f/up. She stated that she had planned to come for check up w/Nicole Delgado after she has her MM and pelvic exam done so that she can discuss them with Nicole Delgado when she comes, but was not able to get appts to have those done until April and May. Pt wants to know whether she needs to come on in to see Nicole Delgado sooner, or if she can wait until after those appts? Please advise.

## 2015-07-15 NOTE — Telephone Encounter (Signed)
Fine to f/u whenever she wants - no need for urgency. However with the mammogram and pelvic exams they will discuss those with her at her visit by the providers who are performing them so there is no real point on having those done first.  She had a hysterectomy for benign reasons and is over 65 yo so a "screening pelvic exam" really is of quite debatable value. If she does see a gynecologist, needs to make sure they aren't charging for a screening physical/wellness exam as then would not have a second covered at our office.  After she finished the rx vitamin D course, she should switch to otc vitamin D 2000u/d.

## 2015-07-15 NOTE — Telephone Encounter (Signed)
Gave pt info and sch CPE for 09/15/15.

## 2015-09-12 ENCOUNTER — Ambulatory Visit
Admission: RE | Admit: 2015-09-12 | Discharge: 2015-09-12 | Disposition: A | Discharge status: Home / Self Care | Payer: PPO | Source: Ambulatory Visit

## 2015-09-12 DIAGNOSIS — Z1231 Encounter for screening mammogram for malignant neoplasm of breast: Secondary | ICD-10-CM

## 2015-09-14 NOTE — Progress Notes (Deleted)
Subjective:    Nicole Delgado is a 65 y.o. female who presents for Medicare Annual/Subsequent preventive examination.  I last saw Nicole Delgado 1 yr prior for her CPE  Preventive Screening-Counseling & Management  Tobacco History  Smoking status  . Never Smoker   Smokeless tobacco  . Never Used      Current Problems (verified) Patient Active Problem List   Diagnosis Date Noted  . Osteoporosis 11/06/2014  . Vitamin D deficiency 10/17/2014  . Hepatic adenoma 04/20/2013  . Chronic right shoulder pain 04/20/2013  . Insomnia 04/20/2013   Supplements: circumin?  Medications Prior to Visit Current Outpatient Prescriptions on File Prior to Visit  Medication Sig Dispense Refill  . albuterol (PROVENTIL HFA;VENTOLIN HFA) 108 (90 BASE) MCG/ACT inhaler Inhale 2 puffs into the lungs every 6 (six) hours as needed for wheezing or shortness of breath. 1 Inhaler 0  . alendronate (FOSAMAX) 70 MG tablet Take 1 tablet (70 mg total) by mouth every 7 (seven) days. Take with a full glass of water on an empty stomach. 12 tablet 4  . cyclobenzaprine (FLEXERIL) 5 MG tablet Take 1 tablet (5 mg total) by mouth at bedtime as needed. 30 tablet 0  . GLUCOSAMINE-CHONDROITIN PO Take 1 tablet by mouth 2 (two) times daily with a meal.    . HYDROcodone-homatropine (HYCODAN) 5-1.5 MG/5ML syrup Take 5 mLs by mouth every 6 (six) hours as needed for cough. 120 mL 0  . traMADol (ULTRAM) 50 MG tablet Take 1 tablet (50 mg total) by mouth every 6 (six) hours as needed. 30 tablet 0  . traZODone (DESYREL) 50 MG tablet Take 1 tablet (50 mg total) by mouth at bedtime as needed for sleep. 30 tablet 3  . Vitamin D, Ergocalciferol, (DRISDOL) 50000 units CAPS capsule TAKE 1 CAPSULE (50,000 UNITS TOTAL) BY MOUTH ONCE A WEEK 12 capsule 0  . zoster vaccine live, PF, (ZOSTAVAX) 09811 UNT/0.65ML injection Inject 19,400 Units into the skin once. (Patient not taking: Reported on 03/23/2015) 1 vial 0   No current facility-administered  medications on file prior to visit.    Current Medications (verified) Current Outpatient Prescriptions  Medication Sig Dispense Refill  . albuterol (PROVENTIL HFA;VENTOLIN HFA) 108 (90 BASE) MCG/ACT inhaler Inhale 2 puffs into the lungs every 6 (six) hours as needed for wheezing or shortness of breath. 1 Inhaler 0  . alendronate (FOSAMAX) 70 MG tablet Take 1 tablet (70 mg total) by mouth every 7 (seven) days. Take with a full glass of water on an empty stomach. 12 tablet 4  . cyclobenzaprine (FLEXERIL) 5 MG tablet Take 1 tablet (5 mg total) by mouth at bedtime as needed. 30 tablet 0  . GLUCOSAMINE-CHONDROITIN PO Take 1 tablet by mouth 2 (two) times daily with a meal.    . HYDROcodone-homatropine (HYCODAN) 5-1.5 MG/5ML syrup Take 5 mLs by mouth every 6 (six) hours as needed for cough. 120 mL 0  . traMADol (ULTRAM) 50 MG tablet Take 1 tablet (50 mg total) by mouth every 6 (six) hours as needed. 30 tablet 0  . traZODone (DESYREL) 50 MG tablet Take 1 tablet (50 mg total) by mouth at bedtime as needed for sleep. 30 tablet 3  . Vitamin D, Ergocalciferol, (DRISDOL) 50000 units CAPS capsule TAKE 1 CAPSULE (50,000 UNITS TOTAL) BY MOUTH ONCE A WEEK 12 capsule 0  . zoster vaccine live, PF, (ZOSTAVAX) 91478 UNT/0.65ML injection Inject 19,400 Units into the skin once. (Patient not taking: Reported on 03/23/2015) 1 vial 0   No  current facility-administered medications for this visit.     Allergies (verified) Bee venom; Penicillins; and Valium   PAST HISTORY  Family History Family History  Problem Relation Age of Onset  . Hypertension Sister   . Colon cancer Neg Hx   . Alzheimer's disease Mother   . Heart Problems Father   . Heart disease Father   . Cancer Brother     Social History Social History  Substance Use Topics  . Smoking status: Never Smoker   . Smokeless tobacco: Never Used  . Alcohol Use: No     Are there smokers in your home (other than you)? {yes/no:20286}  Risk  Factors Current exercise habits: {exercise:19826}  Dietary issues discussed: ***   Cardiac risk factors: {risk factors:510}.  Depression Screen (Note: if answer to either of the following is "Yes", a more complete depression screening is indicated)   Over the past two weeks, have you felt down, depressed or hopeless? {yes/no:20286}  Over the past two weeks, have you felt little interest or pleasure in doing things? {yes/no:20286}  Have you lost interest or pleasure in daily life? {yes/no:20286}  Do you often feel hopeless? {yes/no:20286}  Do you cry easily over simple problems? {yes/no:20286}  Activities of Daily Living In your present state of health, do you have any difficulty performing the following activities?:  Driving? {yes/no:20286} Managing money?  {Responses; yes/no (default no):140031::"No"} Feeding yourself? {yes/no:20286} Getting from bed to chair? {yes/no:20286}{exam, Complete:18323} Climbing a flight of stairs? {yes/no:20286} Preparing food and eating?: {yes/no (default no):140031::"No"} Bathing or showering? {Responses; yes/no (default no):140031::"No"} Getting dressed: {yes/no (default no):140031::"No"} Getting to the toilet? {Responses; yes/no (default no):140031::"No"} Using the toilet:{yes/no (default no):140031::"No"} Moving around from place to place: {yes/no (default no):140031::"No"} In the past year have you fallen or had a near fall?:{yes/no (default no):140031::"No"}   Are you sexually active?  {yes/no:20286}  Do you have more than one partner?  {Responses; yes/no (default no):140031::"No"}  Hearing Difficulties: {yes/no (default no):140031::"No"} Do you often ask people to speak up or repeat themselves? {Responses; yes/no (default no):140031::"No"} Do you experience ringing or noises in your ears? {Responses; yes/no (default no):140031::"No"} Do you have difficulty understanding soft or whispered voices? {Responses; yes/no (default  no):140031::"No"}   Do you feel that you have a problem with memory? {yes/no:20286}  Do you often misplace items? {yes/no:20286}  Do you feel safe at home?  {yes/no:20286}  Cognitive Testing  Alert? {yes/no:20286}  Normal Appearance?{yes/no:20286}  Oriented to person? {yes/no:20286}  Place? {yes/no:20286}   Time? {yes/no:20286}  Recall of three objects?  {yes/no:20286}  Can perform simple calculations? {yes/no:20286}  Displays appropriate judgment?{yes/no:20286}  Can read the correct time from a watch face?{yes/no:20286}   Advanced Directives have been discussed with the patient? {yes/no:20286}    Indicate any recent Medical Services you may have received from other than [facility] providers in the past year (date may be approximate).  Immunization History  Administered Date(s) Administered  . Pneumococcal Conjugate-13 09/03/2014    Screening Tests Health Maintenance  Topic Date Due  . TETANUS/TDAP  07/21/1967  . ZOSTAVAX  07/20/2008  . INFLUENZA VACCINE  02/10/2016 (Originally 01/10/2016)  . MAMMOGRAM  09/11/2017  . COLONOSCOPY  10/23/2017  . DEXA SCAN  Completed  . Hepatitis C Screening  Completed  . PNA vac Low Risk Adult  Completed    All answers were reviewed with the patient and necessary referrals were made:  Delman Cheadle, MD   09/14/2015   History reviewed: {history reviewed:20406::"allergies","current medications","past family history","past medical history","past social history","past surgical  history","problem list"}  Review of Systems {ros; complete:30496}    Objective:     Vision by Snellen chart: right eye:{vision:19455::"20/20"}, left eye:{vision:19455::"20/20"}  There is no weight on file to calculate BMI. There were no vitals taken for this visit.  {Exam, female:18323}     Assessment:     ***     Plan:     During the course of the visit the patient was educated and counseled about appropriate screening and preventive services including:     {plan:19836}    Diet review for nutrition referral? Yes ____  Not Indicated ____   Patient Instructions (the written plan) was given to the patient.  Medicare Attestation I have personally reviewed: The patient's medical and social history Their use of alcohol, tobacco or illicit drugs Their current medications and supplements The patient's functional ability including ADLs,fall risks, home safety risks, cognitive, and hearing and visual impairment Diet and physical activities Evidence for depression or mood disorders  The patient's weight, height, BMI, and visual acuity have been recorded in the chart.  I have made referrals, counseling, and provided education to the patient based on review of the above and I have provided the patient with a written personalized care plan for preventive services.     Delman Cheadle, MD   09/14/2015    pneumovax

## 2015-09-15 ENCOUNTER — Encounter: Payer: Self-pay | Admitting: Family Medicine

## 2015-09-15 ENCOUNTER — Telehealth: Payer: Self-pay | Admitting: Family Medicine

## 2015-09-15 NOTE — Telephone Encounter (Signed)
Pt was scheduled for her medicare annual wellness visit today which she no-showed.  Here are the notes that I had made in preparation for her visit.   Problems Prior to Visit 1. Osteoporosis: Ca/vit D intake? Weight-bearing exercise? Started on fosamax 1 yr prior after last dexa. Did have vit D def at 74 last yr 2. Arthritis, chronic back pain due to prior injury with lumbar DDD 3.  H/o 2 hepatic adenomas seen on 04/2013 [place] - suspected benign 4.   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 On waiting list for cornea transplant. Followed by Dr. Nolon Bussing at [facility] 5.   Hep C immunity   List the Names of Other Physician/Practitioners you currently use: 1.  Dentist: Carmelia Roller 2.  OrthoLetta Moynahan 3.  Gynecologist: Dr. Baird Cancer (retired) 4.  GI:  Dr. Deatra Ina 5.  Optho: Dr. Nolon Bussing, [place] [facility] normal DEXA: 09/2014 lowest T score -3.4 at [facility] c/w osteoporosis - repeat in 1 yr Pap: none needed as s/p hysterectomy in 1995 for benign indication of fibroids but still has ovaries, last pap 2008 CRS: colonoscopy 2014 by Dr. Deatra Ina - 5 yr recall Imm: always refuses flu vaccine; zostavax? (given rx last yr), s/p prevnar 13 08/2014, needs pneumovax today.  Needs tdap but no insurance coverage. EKG: done 07/2014   Ordered ua, cbc, cmp, a1c, lipid, tsh, vit D, and pneumovax

## 2015-09-15 NOTE — Progress Notes (Signed)
This encounter was created in error - please disregard.

## 2015-10-28 DIAGNOSIS — Z01419 Encounter for gynecological examination (general) (routine) without abnormal findings: Secondary | ICD-10-CM | POA: Diagnosis not present

## 2015-10-28 DIAGNOSIS — Z1389 Encounter for screening for other disorder: Secondary | ICD-10-CM | POA: Diagnosis not present

## 2015-10-28 DIAGNOSIS — Z6826 Body mass index (BMI) 26.0-26.9, adult: Secondary | ICD-10-CM | POA: Diagnosis not present

## 2015-11-21 ENCOUNTER — Ambulatory Visit: Payer: PPO

## 2015-11-23 ENCOUNTER — Ambulatory Visit (HOSPITAL_COMMUNITY)
Admission: RE | Admit: 2015-11-23 | Discharge: 2015-11-23 | Disposition: A | Discharge status: Home / Self Care | Payer: PPO | Source: Ambulatory Visit | Attending: Family Medicine | Admitting: Family Medicine

## 2015-11-23 ENCOUNTER — Telehealth: Payer: Self-pay | Admitting: Emergency Medicine

## 2015-11-23 ENCOUNTER — Ambulatory Visit (INDEPENDENT_AMBULATORY_CARE_PROVIDER_SITE_OTHER): Payer: PPO | Admitting: Family Medicine

## 2015-11-23 VITALS — BP 132/80 | HR 74 | Temp 98.0°F | Resp 16 | Ht 66.0 in | Wt 161.4 lb

## 2015-11-23 DIAGNOSIS — M79602 Pain in left arm: Secondary | ICD-10-CM

## 2015-11-23 DIAGNOSIS — M79605 Pain in left leg: Secondary | ICD-10-CM

## 2015-11-23 DIAGNOSIS — G479 Sleep disorder, unspecified: Secondary | ICD-10-CM

## 2015-11-23 DIAGNOSIS — R600 Localized edema: Secondary | ICD-10-CM | POA: Diagnosis not present

## 2015-11-23 DIAGNOSIS — G4700 Insomnia, unspecified: Secondary | ICD-10-CM | POA: Diagnosis not present

## 2015-11-23 MED ORDER — TRAZODONE HCL 50 MG PO TABS: 50.0000 mg | ORAL_TABLET | Freq: Every evening | ORAL | Status: DC | PRN

## 2015-11-23 NOTE — Progress Notes (Signed)
By signing my name below, I, Mesha Guinyard, attest that this documentation has been prepared under the direction and in the presence of Delman Cheadle, MD.  Electronically Signed: Verlee Monte, Medical Scribe. 11/23/2015. 9:25 AM.  Subjective:    Patient ID: Nicole Delgado, female    DOB: April 16, 1951, 65 y.o.   MRN: [id number]  HPI Chief Complaint  Patient presents with  . leg problem    since saturday, pt. described a hot sensation on her left calf   . Medication Refill    trazodone    HPI Comments: Nicole Delgado is a 65 y.o. female who presents to the [facility] complaining of intermittent pain in the back of her knee onset 4 days ago. Pt describes the pain as a burning sensation with a match under her. The pain wakes her up at night. Pt pain comes every 45 mins. Nothing triggeres the pain.  Pt noticed it after walking 12 miles. Pt doesn't push herself until she's out of breath. Pt is used to walking long distanced. Pt denies muscle spasms in her legs.  Sleep: Pt as been off of her trazodone for about a month. Pt didn't have to take it every night. Pt sleeps better when she walks. Pt thinks her bp is up because she hasn't been sleeping well lately.  SHx: Pt's friend died yesterday at 40 y/o from a blood clot.  PMHx: Pt was last seen a year ago- at that tine she was complainaing about constant tingling in her left arm and leg for several months. She had a left foot fracture Oct 2015. she also has a hx of "broken back" around 2000 and several degerative disk, including a slipped dick at L3. She was told she might need lumbar surgery in the future- follows her orthopedis Dr. Salli Real. At the point she was treated with a course of systemic prednisone with plans to persue a MRI NCV or a specialy refferal if symptoms did not respond. Inflamitory marker B12 RPR were nl.  Patient Active Problem List   Diagnosis Date Noted  . Osteoporosis 11/06/2014  . Vitamin D deficiency  10/17/2014  . Hepatic adenoma 04/20/2013  . Chronic right shoulder pain 04/20/2013  . Insomnia 04/20/2013   Past Medical History  Diagnosis Date  . Allergy   . Asthma   . Keratoconus     followed by op[facility] at [facility], on retinal transplant list, legally blind   Past Surgical History  Procedure Laterality Date  . Rotator cuff surgery    . Breast surgery      breast biopies x4  . Abdominal hysterectomy  1995   Allergies  Allergen Reactions  . Bee Venom Swelling  . Penicillins Hives  . Valium [Diazepam] Other (See Comments)    Hallucinations   Prior to Admission medications   Medication Sig Start Date End Date Taking? Authorizing Provider  albuterol (PROVENTIL HFA;VENTOLIN HFA) 108 (90 BASE) MCG/ACT inhaler Inhale 2 puffs into the lungs every 6 (six) hours as needed for wheezing or shortness of breath. 05/03/15  Yes Tresa Garter, MD  GLUCOSAMINE-CHONDROITIN PO Take 1 tablet by mouth 2 (two) times daily with a meal.   Yes Historical Provider, MD  traZODone (DESYREL) 50 MG tablet Take 1 tablet (50 mg total) by mouth at bedtime as needed for sleep. 07/16/14  Yes Posey Boyer, MD  zoster vaccine live, PF, (ZOSTAVAX) 29562 UNT/0.65ML injection Inject 19,400 Units into the skin once. 09/03/14  Yes Laurey Arrow  Brigitte Pulse, MD  alendronate (FOSAMAX) 70 MG tablet Take 1 tablet (70 mg total) by mouth every 7 (seven) days. Take with a full glass of water on an empty stomach. Patient not taking: Reported on 11/23/2015 11/06/14   Shawnee Knapp, MD  cyclobenzaprine (FLEXERIL) 5 MG tablet Take 1 tablet (5 mg total) by mouth at bedtime as needed. Patient not taking: Reported on 11/23/2015 03/23/15   Thao P Le, DO  HYDROcodone-homatropine (HYCODAN) 5-1.5 MG/5ML syrup Take 5 mLs by mouth every 6 (six) hours as needed for cough. Patient not taking: Reported on 11/23/2015 06/30/15   Dorie Rank, MD  traMADol (ULTRAM) 50 MG tablet Take 1 tablet (50 mg total) by mouth every 6 (six) hours as needed. Patient not taking:  Reported on 11/23/2015 03/23/15   Glenford Bayley, DO   Social History   Social History  . Marital Status: Divorced    Spouse Name: N/A  . Number of Children: N/A  . Years of Education: N/A   Occupational History  . Realtor    Social History Main Topics  . Smoking status: Never Smoker   . Smokeless tobacco: Never Used  . Alcohol Use: No  . Drug Use: No  . Sexual Activity: Yes    Birth Control/ Protection: Surgical   Other Topics Concern  . Not on file   Social History Narrative   Depression screen [place] 2/9 11/23/2015 03/23/2015 09/03/2014 04/20/2013  Decreased Interest 0 0 0 0  Down, Depressed, Hopeless 0 0 0 0  PHQ - 2 Score 0 0 0 0    Review of Systems  Constitutional: Negative for fever.  HENT: Negative for rhinorrhea and sore throat.   Respiratory: Negative for cough, chest tightness and shortness of breath.   Cardiovascular: Negative for chest pain, palpitations and leg swelling.  Musculoskeletal: Negative for myalgias, back pain and neck pain.  Neurological: Negative for weakness.  Psychiatric/Behavioral: Positive for sleep disturbance.    Objective:  BP 132/80 mmHg  Pulse 74  Temp(Src) 98 F (36.7 C) (Oral)  Resp 16  Ht 5\' 6"  (1.676 m)  Wt 161 lb 6.4 oz (73.211 kg)  BMI 26.06 kg/m2  SpO2 97%  Physical Exam  Constitutional: She appears well-developed and well-nourished. No distress.  HENT:  Head: Normocephalic and atraumatic.  Eyes: Conjunctivae are normal.  Neck: Neck supple. No thyromegaly present.  Cardiovascular: Normal rate, regular rhythm, S1 normal, S2 normal and normal heart sounds.  Exam reveals no gallop and no friction rub.   No murmur heard. Pulmonary/Chest: Effort normal and breath sounds normal. No respiratory distress. She has no wheezes. She has no rales.  Musculoskeletal: She exhibits no edema (lower extremity).  No cords palpable There is 2cm hyperpigmented macule over area of pain A small amount of swelling in compressable soft  tissue Mass non-tender no warmth over the left lateral aspect  Distal measurement: Left 9.5 inches & Right lower tibial 9 inches Proximal measurement was 3/4 inch larger on left compared to right: 13.25 inches right leg & 14 inches left leg  Neurological: She is alert.  Skin: Skin is warm and dry.  Psychiatric: She has a normal mood and affect. Her behavior is normal.  Nursing note and vitals reviewed.  Assessment & Plan:   1. Lower extremity pain, inferior, left   2. Insomnia   3. Sleep disturbance   Pt sent for stat left lower ext venous doppler today to r/o DVT - doppler negative so just continue watchful waiting. Recheck with  me in 1 wk at CPE.  Meds ordered this encounter  Medications  . traZODone (DESYREL) 50 MG tablet    Sig: Take 1-2 tablets (50-100 mg total) by mouth at bedtime as needed for sleep.    Dispense:  180 tablet    Refill:  3    I personally performed the services described in this documentation, which was scribed in my presence. The recorded information has been reviewed and considered, and addended by me as needed.   Delman Cheadle, M.D.  [facility] [address] [place], [place] [address] [phone] phone [phone] fax  12/09/2015 9:57 AM

## 2015-11-23 NOTE — Progress Notes (Addendum)
*  PRELIMINARY RESULTS* Vascular Ultrasound Left lower extremity venous duplex has been completed.  Preliminary findings: No evidence of DVT or baker's cyst.   Called results to [place] at [facility].    Landry Mellow, RDMS, RVT  11/23/2015, 10:53 AM

## 2015-11-23 NOTE — Telephone Encounter (Signed)
Left message with negative DVT results  Pt instructed to ice leg and use Advil as needed for pain

## 2015-11-23 NOTE — Patient Instructions (Signed)
     IF you received an x-ray today, you will receive an invoice from [facility]. Please contact [facility] at [phone] with questions or concerns regarding your invoice.   IF you received labwork today, you will receive an invoice from [facility]. Please contact [facility] at [phone] with questions or concerns regarding your invoice.   Our billing staff will not be able to assist you with questions regarding bills from these companies.  You will be contacted with the lab results as soon as they are available. The fastest way to get your results is to activate your My Chart account. Instructions are located on the last page of this paperwork. If you have not heard from us regarding the results in 2 weeks, please contact this office.      

## 2015-12-01 ENCOUNTER — Encounter: Payer: Self-pay | Admitting: Family Medicine

## 2015-12-01 ENCOUNTER — Ambulatory Visit (INDEPENDENT_AMBULATORY_CARE_PROVIDER_SITE_OTHER): Payer: PPO | Admitting: Family Medicine

## 2015-12-01 VITALS — BP 128/74 | HR 84 | Temp 97.7°F | Resp 16 | Ht 66.0 in | Wt 162.0 lb

## 2015-12-01 DIAGNOSIS — Z1329 Encounter for screening for other suspected endocrine disorder: Secondary | ICD-10-CM

## 2015-12-01 DIAGNOSIS — Z1383 Encounter for screening for respiratory disorder NEC: Secondary | ICD-10-CM | POA: Diagnosis not present

## 2015-12-01 DIAGNOSIS — L729 Follicular cyst of the skin and subcutaneous tissue, unspecified: Secondary | ICD-10-CM | POA: Diagnosis not present

## 2015-12-01 DIAGNOSIS — E559 Vitamin D deficiency, unspecified: Secondary | ICD-10-CM | POA: Diagnosis not present

## 2015-12-01 DIAGNOSIS — Z136 Encounter for screening for cardiovascular disorders: Secondary | ICD-10-CM | POA: Diagnosis not present

## 2015-12-01 DIAGNOSIS — Z79899 Other long term (current) drug therapy: Secondary | ICD-10-CM | POA: Diagnosis not present

## 2015-12-01 DIAGNOSIS — Z23 Encounter for immunization: Secondary | ICD-10-CM

## 2015-12-01 DIAGNOSIS — M810 Age-related osteoporosis without current pathological fracture: Secondary | ICD-10-CM | POA: Diagnosis not present

## 2015-12-01 DIAGNOSIS — M67449 Ganglion, unspecified hand: Secondary | ICD-10-CM

## 2015-12-01 DIAGNOSIS — Z1389 Encounter for screening for other disorder: Secondary | ICD-10-CM

## 2015-12-01 DIAGNOSIS — Z0000 Encounter for general adult medical examination without abnormal findings: Secondary | ICD-10-CM

## 2015-12-01 DIAGNOSIS — Z130 Encounter for screening for diseases of the blood and blood-forming organs and certain disorders involving the immune mechanism: Secondary | ICD-10-CM

## 2015-12-01 LAB — COMPREHENSIVE METABOLIC PANEL
ALBUMIN: 3.9 g/dL (ref 3.6–5.1)
ALK PHOS: 84 U/L (ref 33–130)
ALT: 16 U/L (ref 6–29)
AST: 21 U/L (ref 10–35)
BUN: 11 mg/dL (ref 7–25)
CHLORIDE: 100 mmol/L (ref 98–110)
CO2: 30 mmol/L (ref 20–31)
CREATININE: 0.86 mg/dL (ref 0.50–0.99)
Calcium: 9.4 mg/dL (ref 8.6–10.4)
Glucose, Bld: 89 mg/dL (ref 65–99)
POTASSIUM: 4.6 mmol/L (ref 3.5–5.3)
Sodium: 138 mmol/L (ref 135–146)
TOTAL PROTEIN: 6.8 g/dL (ref 6.1–8.1)
Total Bilirubin: 0.6 mg/dL (ref 0.2–1.2)

## 2015-12-01 LAB — VITAMIN D 25 HYDROXY (VIT D DEFICIENCY, FRACTURES): Vit D, 25-Hydroxy: 34 ng/mL (ref 30–100)

## 2015-12-01 LAB — POCT URINALYSIS DIP (MANUAL ENTRY)
BILIRUBIN UA: NEGATIVE
BILIRUBIN UA: NEGATIVE
Blood, UA: NEGATIVE
GLUCOSE UA: NEGATIVE
LEUKOCYTES UA: NEGATIVE
Nitrite, UA: NEGATIVE
Protein Ur, POC: NEGATIVE
SPEC GRAV UA: 1.015
Urobilinogen, UA: 0.2
pH, UA: 6.5

## 2015-12-01 LAB — LIPID PANEL
CHOLESTEROL: 250 mg/dL — AB (ref 125–200)
HDL: 60 mg/dL (ref >=46)
LDL Cholesterol: 162 mg/dL — ABNORMAL HIGH (ref <130)
TRIGLYCERIDES: 138 mg/dL (ref <150)
Total CHOL/HDL Ratio: 4.2 Ratio (ref <=5.0)
VLDL: 28 mg/dL (ref <30)

## 2015-12-01 LAB — CBC
HCT: 41.6 % (ref 35.0–45.0)
HEMOGLOBIN: 13.4 g/dL (ref 11.7–15.5)
MCH: 27.6 pg (ref 27.0–33.0)
MCHC: 32.2 g/dL (ref 32.0–36.0)
MCV: 85.8 fL (ref 80.0–100.0)
MPV: 10.2 fL (ref 7.5–12.5)
Platelets: 202 10*3/uL (ref 140–400)
RBC: 4.85 MIL/uL (ref 3.80–5.10)
RDW: 13.6 % (ref 11.0–15.0)
WBC: 5.1 10*3/uL (ref 3.8–10.8)

## 2015-12-01 LAB — TSH: TSH: 0.86 m[IU]/L

## 2015-12-01 NOTE — Progress Notes (Signed)
Subjective:    Patient ID: Nicole Delgado, female    DOB: 1951-04-12, 65 y.o.   MRN: [id number]  Chief Complaint  Patient presents with  . Annual Exam    no pap    HPI  Nicole Delgado is a delightful 65 yo here today for a full physical. Her last one was done by myself on 09/03/14.  Primary preventatie care: Mam: normal 09/12/2015 - goes to [place]. No paps needed due to age and s/p hysterectomy in 1995 for benign indications of fibroids. Pelvic last done in 2017 by Dr. Paula Compton - normal though pt did have pain in the right lower quadrant. No pelvic cpomplaints CRS: colonoscopy done 10/2012 by Dr. Deatra Ina with benign polyps. Repeat in 5 years Bone scan: initial was just done 09/2014 which showed osteoporosis with T score of -3.4 at [place]. Immunizations: prevnar 13 done 08/2014 Neg hep C, nml B12, and hgba1c 5.7 last yr. [place] November, 2014 showed 2 liver lesions, suspected benign adenomas.   Vitamin D def: on vit D - she thinks she is taking 2000u vit D/d. Insomnia: on trazodone prn  Seen 1 wk prior for burning type pain on the back of her knee x 4d - intermittent about every 45 min. Is now largely resolved.  Has an arthritic cyst on her left 4th finger at the [facility].  However, they were out of network Walking, water, trying to cut back on salt  Dentist: Carmelia Roller Ortho: Letta Moynahan Gynecologist: Dr. Paula Compton  Past Medical History  Diagnosis Date  . Allergy   . Asthma   . Keratoconus     followed by optho at [facility] on retinal transplant list, legally blind   Past Surgical History  Procedure Laterality Date  . Rotator cuff surgery    . Breast surgery      breast biopies x4  . Abdominal hysterectomy  1995   Current Outpatient Prescriptions on File Prior to Visit  Medication Sig Dispense Refill  . albuterol (PROVENTIL HFA;VENTOLIN HFA) 108 (90 BASE) MCG/ACT inhaler Inhale 2 puffs into the lungs every 6 (six) hours as needed for wheezing or shortness of  breath. 1 Inhaler 0  . GLUCOSAMINE-CHONDROITIN PO Take 1 tablet by mouth 2 (two) times daily with a meal.    . traZODone (DESYREL) 50 MG tablet Take 1-2 tablets (50-100 mg total) by mouth at bedtime as needed for sleep. 180 tablet 3   No current facility-administered medications on file prior to visit.   Allergies  Allergen Reactions  . Bee Venom Swelling  . Penicillins Hives  . Valium [Diazepam] Other (See Comments)    Hallucinations   Family History  Problem Relation Age of Onset  . Hypertension Sister   . Colon cancer Neg Hx   . Alzheimer's disease Mother   . Heart Problems Father   . Heart disease Father   . Cancer Brother    Social History   Social History  . Marital Status: Divorced    Spouse Name: N/A  . Number of Children: N/A  . Years of Education: N/A   Occupational History  . Realtor    Social History Main Topics  . Smoking status: Never Smoker   . Smokeless tobacco: Never Used  . Alcohol Use: No  . Drug Use: No  . Sexual Activity: Yes    Birth Control/ Protection: Surgical   Other Topics Concern  . None   Social History Narrative     Review of Systems See hpi  Objective:  BP 128/74 mmHg  Pulse 84  Temp(Src) 97.7 F (36.5 C) (Oral)  Resp 16  Ht 5\' 6"  (1.676 m)  Wt 162 lb (73.483 kg)  BMI 26.16 kg/m2  Physical Exam  Constitutional: She is oriented to person, place, and time. She appears well-developed and well-nourished. No distress.  HENT:  Head: Normocephalic and atraumatic.  Right Ear: Tympanic membrane, external ear and ear canal normal.  Left Ear: Tympanic membrane, external ear and ear canal normal.  Nose: Nose normal. No mucosal edema or rhinorrhea.  Mouth/Throat: Uvula is midline, oropharynx is clear and moist and mucous membranes are normal. No posterior oropharyngeal erythema.  Eyes: Conjunctivae and EOM are normal. Pupils are equal, round, and reactive to light. Right eye exhibits no discharge. Left eye exhibits no discharge.  No scleral icterus.  Neck: Normal range of motion. Neck supple. No thyromegaly present.  Cardiovascular: Normal rate, regular rhythm, normal heart sounds and intact distal pulses.   Pulmonary/Chest: Effort normal and breath sounds normal. No respiratory distress.  Abdominal: Soft. Bowel sounds are normal. There is no tenderness.  Musculoskeletal: She exhibits no edema.  Lymphadenopathy:    She has no cervical adenopathy.  Neurological: She is alert and oriented to person, place, and time. She has normal reflexes.  Skin: Skin is warm and dry. She is not diaphoretic. No erythema.  Psychiatric: She has a normal mood and affect. Her behavior is normal.          Assessment & Plan:  Tdap?  Pneumovax-23 today, pt really doesn't like vaccines - very reluctant, refuses zostavax and flu vac. Osteoporosis - vit D - start on bisphosphonate?  Refer to hand surgeon who [place] - the [employer] [facility]ated with [facility] and she needs someone in [facility] to have mucous cyst removed left 4th finger. Cannot do bisphosphonates because she is currently undergoing dental implants. Is a runner, has started lifintg weights  1. Annual physical exam   2. Screening for cardiovascular, respiratory, and genitourinary diseases   3. Screening for deficiency anemia   4. Screening for thyroid disorder   5. Vitamin D deficiency   6. Osteoporosis   7. Mucous cyst of finger     Orders Placed This Encounter  Procedures  . Pneumococcal polysaccharide vaccine 23-valent greater than or equal to 2yo subcutaneous/IM  . VITAMIN D 25 Hydroxy (Vit-D Deficiency, Fractures)  . CBC  . Comprehensive metabolic panel    Order Specific Question:  Has the patient fasted?    Answer:  Yes  . TSH  . Lipid panel    Order Specific Question:  Has the patient fasted?    Answer:  Yes  . Ambulatory referral to Endocrinology    Referral Priority:  Routine    Referral Type:  Consultation    Referral Reason:  Specialty  Services Required    Number of Visits Requested:  1  . Ambulatory referral to Hand Surgery    Referral Priority:  Routine    Referral Type:  Surgical    Referral Reason:  Specialty Services Required    Requested Specialty:  Hand Surgery    Number of Visits Requested:  1  . POCT urinalysis dipstick     Delman Cheadle, M.D.  [facility] [address] [place], [place] [address] [phone] phone [phone] fax  12/10/2015 10:32 PM  Results for orders placed or performed in visit on 12/01/15  VITAMIN D 25 Hydroxy (Vit-D Deficiency,  Fractures)  Result Value Ref Range   Vit D, 25-Hydroxy 34 30 - 100 ng/mL  CBC  Result Value Ref Range   WBC 5.1 3.8 - 10.8 K/uL   RBC 4.85 3.80 - 5.10 MIL/uL   Hemoglobin 13.4 11.7 - 15.5 g/dL   HCT 41.6 35.0 - 45.0 %   MCV 85.8 80.0 - 100.0 fL   MCH 27.6 27.0 - 33.0 pg   MCHC 32.2 32.0 - 36.0 g/dL   RDW 13.6 11.0 - 15.0 %   Platelets 202 140 - 400 K/uL   MPV 10.2 7.5 - 12.5 fL  Comprehensive metabolic panel  Result Value Ref Range   Sodium 138 135 - 146 mmol/L   Potassium 4.6 3.5 - 5.3 mmol/L   Chloride 100 98 - 110 mmol/L   CO2 30 20 - 31 mmol/L   Glucose, Bld 89 65 - 99 mg/dL   BUN 11 7 - 25 mg/dL   Creat 0.86 0.50 - 0.99 mg/dL   Total Bilirubin 0.6 0.2 - 1.2 mg/dL   Alkaline Phosphatase 84 33 - 130 U/L   AST 21 10 - 35 U/L   ALT 16 6 - 29 U/L   Total Protein 6.8 6.1 - 8.1 g/dL   Albumin 3.9 3.6 - 5.1 g/dL   Calcium 9.4 8.6 - 10.4 mg/dL  TSH  Result Value Ref Range   TSH 0.86 mIU/L  Lipid panel  Result Value Ref Range   Cholesterol 250 (H) 125 - 200 mg/dL   Triglycerides 138 <150 mg/dL   HDL 60 >=46 mg/dL   Total CHOL/HDL Ratio 4.2 <=5.0 Ratio   VLDL 28 <30 mg/dL   LDL Cholesterol 162 (H) <130 mg/dL  POCT urinalysis dipstick  Result Value Ref Range   Color, UA yellow yellow   Clarity, UA clear clear   Glucose, UA negative negative   Bilirubin, UA negative negative   Ketones, POC UA negative  negative   Spec Grav, UA 1.015    Blood, UA negative negative   pH, UA 6.5    Protein Ur, POC negative negative   Urobilinogen, UA 0.2    Nitrite, UA Negative Negative   Leukocytes, UA Negative Negative

## 2015-12-01 NOTE — Patient Instructions (Addendum)
IF you received an x-ray today, you will receive an invoice from [facility]. Please contact [facility] at [phone] with questions or concerns regarding your invoice.   IF you received labwork today, you will receive an invoice from [employer]. Please contact [facility] at [phone] with questions or concerns regarding your invoice.   Our billing staff will not be able to assist you with questions regarding bills from these companies.  You will be contacted with the lab results as soon as they are available. The fastest way to get your results is to activate your My Chart account. Instructions are located on the last page of this paperwork. If you have not heard from [place] regarding the results in 2 weeks, please contact this office.   Menopause is a normal process in which your reproductive ability comes to an end. This process happens gradually over a span of months to years, usually between the ages of 32 and 71. Menopause is complete when you have missed 12 consecutive menstrual periods. It is important to talk with your health care provider about some of the most common conditions that affect postmenopausal women, such as heart disease, cancer, and bone loss (osteoporosis). Adopting a healthy lifestyle and getting preventive care can help to promote your health and wellness. Those actions can also lower your chances of developing some of these common conditions. WHAT SHOULD I KNOW ABOUT MENOPAUSE? During menopause, you may experience a number of symptoms, such as:  Moderate-to-severe hot flashes.  Night sweats.  Decrease in sex drive.  Mood swings.  Headaches.  Tiredness.  Irritability.  Memory problems.  Insomnia. Choosing to treat or not to treat menopausal changes is an individual decision that you make with your health care provider. WHAT SHOULD I KNOW ABOUT HORMONE REPLACEMENT THERAPY AND SUPPLEMENTS? Hormone therapy  products are effective for treating symptoms that are associated with menopause, such as hot flashes and night sweats. Hormone replacement carries certain risks, especially as you become older. If you are thinking about using estrogen or estrogen with progestin treatments, discuss the benefits and risks with your health care provider. WHAT SHOULD I KNOW ABOUT HEART DISEASE AND STROKE? Heart disease, heart attack, and stroke become more likely as you age. This may be due, in part, to the hormonal changes that your body experiences during menopause. These can affect how your body processes dietary fats, triglycerides, and cholesterol. Heart attack and stroke are both medical emergencies. There are many things that you can do to help prevent heart disease and stroke:  Have your blood pressure checked at least every 1-2 years. High blood pressure causes heart disease and increases the risk of stroke.  If you are 59-1 years old, ask your health care provider if you should take aspirin to prevent a heart attack or a stroke.  Do not use any tobacco products, including cigarettes, chewing tobacco, or electronic cigarettes. If you need help quitting, ask your health care provider.  It is important to eat a healthy diet and maintain a healthy weight.  Be sure to include plenty of vegetables, fruits, low-fat dairy products, and lean protein.  Avoid eating foods that are high in solid fats, added sugars, or salt (sodium).  Get regular exercise. This is one of the most important things that you can do for your health.  Try to exercise for at least 150 minutes each week. The type of exercise that you do should increase your heart rate and make you  sweat. This is known as moderate-intensity exercise.  Try to do strengthening exercises at least twice each week. Do these in addition to the moderate-intensity exercise.  Know your numbers.Ask your health care provider to check your cholesterol and your blood  glucose. Continue to have your blood tested as directed by your health care provider. WHAT SHOULD I KNOW ABOUT CANCER SCREENING? There are several types of cancer. Take the following steps to reduce your risk and to catch any cancer development as early as possible. Breast Cancer  Practice breast self-awareness.  This means understanding how your breasts normally appear and feel.  It also means doing regular breast self-exams. Let your health care provider know about any changes, no matter how small.  If you are 71 or older, have a clinician do a breast exam (clinical breast exam or CBE) every year. Depending on your age, family history, and medical history, it may be recommended that you also have a yearly breast X-ray (mammogram).  If you have a family history of breast cancer, talk with your health care provider about genetic screening.  If you are at high risk for breast cancer, talk with your health care provider about having an MRI and a mammogram every year.  Breast cancer (BRCA) gene test is recommended for women who have family members with BRCA-related cancers. Results of the assessment will determine the need for genetic counseling and BRCA1 and for BRCA2 testing. BRCA-related cancers include these types:  Breast. This occurs in males or females.  Ovarian.  Tubal. This may also be called fallopian tube cancer.  Cancer of the abdominal or pelvic lining (peritoneal cancer).  Prostate.  Pancreatic. Cervical, Uterine, and Ovarian Cancer Your health care provider may recommend that you be screened regularly for cancer of the pelvic organs. These include your ovaries, uterus, and vagina. This screening involves a pelvic exam, which includes checking for microscopic changes to the surface of your cervix (Pap test).  For women ages 21-65, health care providers may recommend a pelvic exam and a Pap test every three years. For women ages 25-65, they may recommend the Pap test and  pelvic exam, combined with testing for human papilloma virus (HPV), every five years. Some types of HPV increase your risk of cervical cancer. Testing for HPV may also be done on women of any age who have unclear Pap test results.  Other health care providers may not recommend any screening for nonpregnant women who are considered low risk for pelvic cancer and have no symptoms. Ask your health care provider if a screening pelvic exam is right for you.  If you have had past treatment for cervical cancer or a condition that could lead to cancer, you need Pap tests and screening for cancer for at least 20 years after your treatment. If Pap tests have been discontinued for you, your risk factors (such as having a new sexual partner) need to be reassessed to determine if you should start having screenings again. Some women have medical problems that increase the chance of getting cervical cancer. In these cases, your health care provider may recommend that you have screening and Pap tests more often.  If you have a family history of uterine cancer or ovarian cancer, talk with your health care provider about genetic screening.  If you have vaginal bleeding after reaching menopause, tell your health care provider.  There are currently no reliable tests available to screen for ovarian cancer. Lung Cancer Lung cancer screening is recommended for adults  adults 55-80 years old who are at high risk for lung cancer because of a history of smoking. A yearly low-dose CT scan of the lungs is recommended if you:  Currently smoke.  Have a history of at least 30 pack-years of smoking and you currently smoke or have quit within the past 15 years. A pack-year is smoking an average of one pack of cigarettes per day for one year. Yearly screening should:  Continue until it has been 15 years since you quit.  Stop if you develop a health problem that would prevent you from having lung cancer treatment. Colorectal  Cancer  This type of cancer can be detected and can often be prevented.  Routine colorectal cancer screening usually begins at age 50 and continues through age 75.  If you have risk factors for colon cancer, your health care provider may recommend that you be screened at an earlier age.  If you have a family history of colorectal cancer, talk with your health care provider about genetic screening.  Your health care provider may also recommend using home test kits to check for hidden blood in your stool.  A small camera at the end of a tube can be used to examine your colon directly (sigmoidoscopy or colonoscopy). This is done to check for the earliest forms of colorectal cancer.  Direct examination of the colon should be repeated every 5-10 years until age 75. However, if early forms of precancerous polyps or small growths are found or if you have a family history or genetic risk for colorectal cancer, you may need to be screened more often. Skin Cancer  Check your skin from head to toe regularly.  Monitor any moles. Be sure to tell your health care provider:  About any new moles or changes in moles, especially if there is a change in a mole's shape or color.  If you have a mole that is larger than the size of a pencil eraser.  If any of your family members has a history of skin cancer, especially at a young age, talk with your health care provider about genetic screening.  Always use sunscreen. Apply sunscreen liberally and repeatedly throughout the day.  Whenever you are outside, protect yourself by wearing long sleeves, pants, a wide-brimmed hat, and sunglasses. WHAT SHOULD I KNOW ABOUT OSTEOPOROSIS? Osteoporosis is a condition in which bone destruction happens more quickly than new bone creation. After menopause, you may be at an increased risk for osteoporosis. To help prevent osteoporosis or the bone fractures that can happen because of osteoporosis, the following is  recommended:  If you are 19-50 years old, get at least 1,000 mg of calcium and at least 600 mg of vitamin D per day.  If you are older than age 50 but younger than age 70, get at least 1,200 mg of calcium and at least 600 mg of vitamin D per day.  If you are older than age 70, get at least 1,200 mg of calcium and at least 800 mg of vitamin D per day. Smoking and excessive alcohol intake increase the risk of osteoporosis. Eat foods that are rich in calcium and vitamin D, and do weight-bearing exercises several times each week as directed by your health care provider. WHAT SHOULD I KNOW ABOUT HOW MENOPAUSE AFFECTS MY MENTAL HEALTH? Depression may occur at any age, but it is more common as you become older. Common symptoms of depression include:  Low or sad mood.  Changes in sleep patterns.    in appetite or eating patterns.  Feeling an overall lack of motivation or enjoyment of activities that you previously enjoyed.  Frequent crying spells. Talk with your health care provider if you think that you are experiencing depression. WHAT SHOULD I KNOW ABOUT IMMUNIZATIONS? It is important that you get and maintain your immunizations. These include:  Tetanus, diphtheria, and pertussis (Tdap) booster vaccine.  Influenza every year before the flu season begins.  Pneumonia vaccine.  Shingles vaccine. Your health care provider may also recommend other immunizations.   This information is not intended to replace advice given to you by your health care provider. Make sure you discuss any questions you have with your health care provider.   Document Released: 07/20/2005 Document Revised: 06/18/2014 Document Reviewed: 01/28/2014 Elsevier Interactive Patient Education 2016 [employer].  Osteoporosis Osteoporosis is the thinning and loss of density in the bones. Osteoporosis makes the bones more brittle, fragile, and likely to break (fracture). Over time, osteoporosis can cause the bones  to become so weak that they fracture after a simple fall. The bones most likely to fracture are the bones in the hip, wrist, and spine. CAUSES  The exact cause is not known. RISK FACTORS Anyone can develop osteoporosis. You may be at greater risk if you have a family history of the condition or have poor nutrition. You may also have a higher risk if you are:   Female.   22 years old or older.  A smoker.  Not physically active.   White or Asian.  Slender. SIGNS AND SYMPTOMS  A fracture might be the first sign of the disease, especially if it results from a fall or injury that would not usually cause a bone to break. Other signs and symptoms include:   Low back and neck pain.  Stooped posture.  Height loss. DIAGNOSIS  To make a diagnosis, your health care provider may:  Take a medical history.  Perform a physical exam.  Order tests, such as:  A bone mineral density test.  A dual-energy X-ray absorptiometry test. TREATMENT  The goal of osteoporosis treatment is to strengthen your bones to reduce your risk of a fracture. Treatment may involve:  Making lifestyle changes, such as:  Eating a diet rich in calcium.  Doing weight-bearing and muscle-strengthening exercises.  Stopping tobacco use.  Limiting alcohol intake.  Taking medicine to slow the process of bone loss or to increase bone density.  Monitoring your levels of calcium and vitamin D. HOME CARE INSTRUCTIONS  Include calcium and vitamin D in your diet. Calcium is important for bone health, and vitamin D helps the body absorb calcium.  Perform weight-bearing and muscle-strengthening exercises as directed by your health care provider.  Do not use any tobacco products, including cigarettes, chewing tobacco, and electronic cigarettes. If you need help quitting, ask your health care provider.  Limit your alcohol intake.  Take medicines only as directed by your health care provider.  Keep all follow-up  visits as directed by your health care provider. This is important.  Take precautions at home to lower your risk of falling, such as:  Keeping rooms well lit and clutter free.  Installing safety rails on stairs.  Using rubber mats in the bathroom and other areas that are often wet or slippery. SEEK IMMEDIATE MEDICAL CARE IF:  You fall or injure yourself.    This information is not intended to replace advice given to you by your health care provider. Make sure you discuss any questions you have  with your health care provider.   Document Released: 03/07/2005 Document Revised: 06/18/2014 Document Reviewed: 11/05/2013 Elsevier Interactive Patient Education 2016 Elsevier Inc[employer] Calcium Intake Recommendations Calcium is a mineral that affects many functions in the body, including:  Blood clotting.  Blood vessel function.  Nerve impulse conduction.  Hormone secretion.  Muscle contraction.  Bone and teeth functions. Most of your body's calcium supply is stored in your bones and teeth. When your calcium stores are low, you may be at risk for low bone mass, bone loss, and bone fractures. Consuming enough calcium helps to grow healthy bones and teeth and to prevent breakdown over time.  It is very important that you get enough calcium if you are:  A child undergoing rapid growth.  An adolescent girl.  A pre- or post-menopausal woman.  A woman whose menstrual cycle has stopped due to anorexia nervosa or regular intense exercise.  An individual with lactose intolerance or a milk allergy.  A vegetarian. WHAT IS MY PLAN?  Try to consume the recommended amount of calcium daily based on your age. Depending on your overall health, your health care provider may recommend increased calcium intake.General daily calcium intake recommendations by age are:  Birth to 6 months: 200 mg.  Infants 7 to 12 months: 260 mg.  Children 1 to 3 years: 700 mg.  Children 4 to 8 years: 1,000  mg.  Children 9 to 13 years: 1,300 mg.  Teens 14 to 18 years: 1,300 mg.  Adults 19 to 50 years: 1,000 mg.  Adult women 51 to 70 years: 1,200 mg.  Adult men 51 to 70 years: 1,000 mg.  Adults 71 years and older: 1,200 mg.  Pregnant and breastfeeding teens: 1,300 mg.  Pregnant and breastfeeding adults: 1,000 mg. WHAT DO I NEED TO KNOW ABOUT CALCIUM INTAKE?  In order for the body to absorb calcium, it needs vitamin D. You can get vitamin D through:  Direct exposure of the skin to sunlight.  Foods, such as egg yolks, liver, saltwater fish, and fortified milk.  Supplements.  Consuming too much calcium may cause:  Constipation.  Decreased absorption of iron and zinc.  Kidney stones.  Calcium supplements may interact with certain medicines. Check with your health care provider before starting any calcium supplements.  Try to get most of your calcium from food. WHAT FOODS CAN I EAT? Grains Fortified oatmeal. Fortified ready-to-eat cereals. Fortified frozen waffles. Vegetables Turnip greens. Broccoli.  Fruits Fortified orange juice. Meats and Other Protein Sources Canned sardines with bones. Canned salmon with bones. Soy beans. Tofu. Baked beans. Almonds. [place] nuts. Sunflower seeds. Dairy Milk. Yogurt. Cheese. Cottage cheese.  Beverages Fortified soy milk. Fortified rice milk.  Sweets/Desserts Pudding. Ice Cream. Milkshakes. Blackstrap molasses. The items listed above may not be a complete list of recommended foods or beverages. Contact your dietitian for more options.  WHAT FOODS CAN AFFECT MY CALCIUM INTAKE? It may be more difficult for your body to use calcium or calcium may leave your body more quickly if you consume large amounts of:   Sodium.  Protein.  Caffeine.  Alcohol.   This information is not intended to replace advice given to you by your health care provider. Make sure you discuss any questions you have with your health care provider.    Document Released: 01/10/2004 Document Revised: 06/18/2014 Document Reviewed: 11/03/2013 Elsevier Interactive Patient Education [employer].

## 2015-12-05 ENCOUNTER — Telehealth: Payer: Self-pay

## 2015-12-05 DIAGNOSIS — Z5181 Encounter for therapeutic drug level monitoring: Secondary | ICD-10-CM

## 2015-12-05 NOTE — Telephone Encounter (Signed)
Msg is for Nicole Delgado, pt was told her cholesterol is high. She would like for meds to be prescribed to bring it down.  Please advise  [phone]

## 2015-12-06 MED ORDER — PRAVASTATIN SODIUM 40 MG PO TABS: 40.0000 mg | ORAL_TABLET | Freq: Every day | ORAL | Status: DC

## 2015-12-06 NOTE — Telephone Encounter (Signed)
Nicole Delgado -  Your urinalysis, kidneys, liver, salts in your blood, blood counts, and thyroid are normal. Your vitamin D is normal now but is still on the low end of the range so continue on your current regimen or even increase it a little. Your cholesterol is to high. At this level, your cholesterol is doubling your risk of having a major heart attack or stroke in the next 10 years. Current guidelines recommend that you are start on a cholesterol medication to try to lower that risk. I will be happy to send in a cholesterol medication if you would like but I think that it would be reasonable for you to try starting (or doubling) omega-3 supplements and making sure your diet is heavy on the same. We can recheck your cholesterol in about 6 months to see how far those changes have been able to take you and then reassess. Below I have included info on omega-3 supplements and the Mediterranean diet - one of the best for high cholesterol. ES  Dr. Brigitte Pulse did you want to prescribe another medication other than doubling the omega 3?

## 2015-12-06 NOTE — Telephone Encounter (Signed)
Left a vm for patient to callback 

## 2015-12-06 NOTE — Telephone Encounter (Signed)
Pravastatin sent to pharmacy.  She should come by for a lab only visit in 6-8 wks to make sure it is not causing any liver irritation - orders entered. She should come in for a visit to have her cholesterol rechecked in 4-6 mos.

## 2015-12-07 NOTE — Telephone Encounter (Signed)
Called pt and advised message from provider on their voicemail.  

## 2015-12-29 ENCOUNTER — Encounter: Payer: Self-pay | Admitting: Endocrinology

## 2015-12-29 ENCOUNTER — Ambulatory Visit (INDEPENDENT_AMBULATORY_CARE_PROVIDER_SITE_OTHER): Payer: PPO | Admitting: Endocrinology

## 2015-12-29 VITALS — BP 124/72 | HR 80 | Ht 65.5 in | Wt 158.0 lb

## 2015-12-29 DIAGNOSIS — M810 Age-related osteoporosis without current pathological fracture: Secondary | ICD-10-CM | POA: Diagnosis not present

## 2015-12-29 NOTE — Patient Instructions (Signed)
Stay on Vit. D  Calcium at least 500mg  daily
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
## 2016-01-02 DIAGNOSIS — M19042 Primary osteoarthritis, left hand: Secondary | ICD-10-CM | POA: Diagnosis not present

## 2016-01-16 DIAGNOSIS — M24042 Loose body in left finger joint(s): Secondary | ICD-10-CM | POA: Diagnosis not present

## 2016-01-16 DIAGNOSIS — M67442 Ganglion, left hand: Secondary | ICD-10-CM | POA: Diagnosis not present

## 2016-01-16 DIAGNOSIS — M25742 Osteophyte, left hand: Secondary | ICD-10-CM | POA: Diagnosis not present

## 2016-01-16 DIAGNOSIS — D492 Neoplasm of unspecified behavior of bone, soft tissue, and skin: Secondary | ICD-10-CM | POA: Diagnosis not present

## 2016-02-22 ENCOUNTER — Telehealth: Payer: Self-pay | Admitting: Nutrition

## 2016-02-22 NOTE — Telephone Encounter (Signed)
Message left on machine to call to schedule lab work before we can schedule for reclast infusion

## 2016-02-24 IMAGING — CR DG FOOT COMPLETE 3+V*L*
2 series · 2 of 2 positions shown · non-contrast
Comparison: None.

CLINICAL DATA: Lateral foot pain post trauma 3 hr prior

EXAM:
LEFT FOOT - COMPLETE 3+ VIEW

[AP]
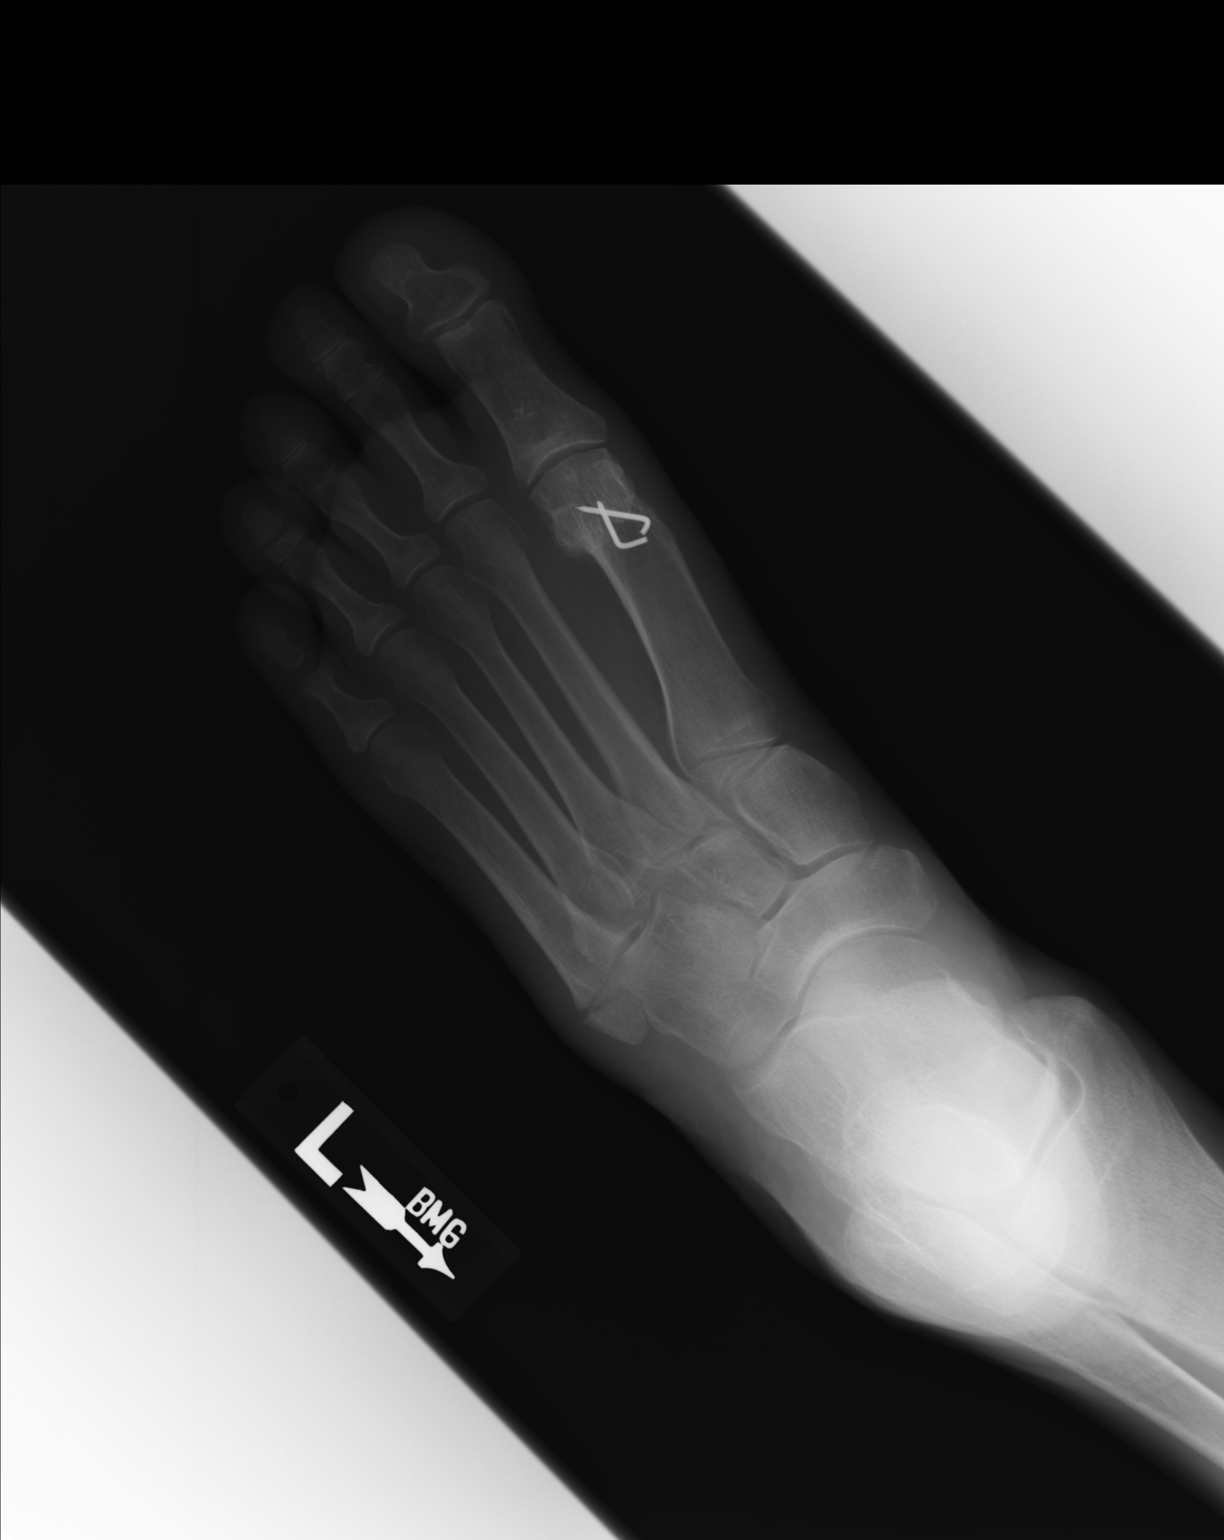

[ap obl int rot]
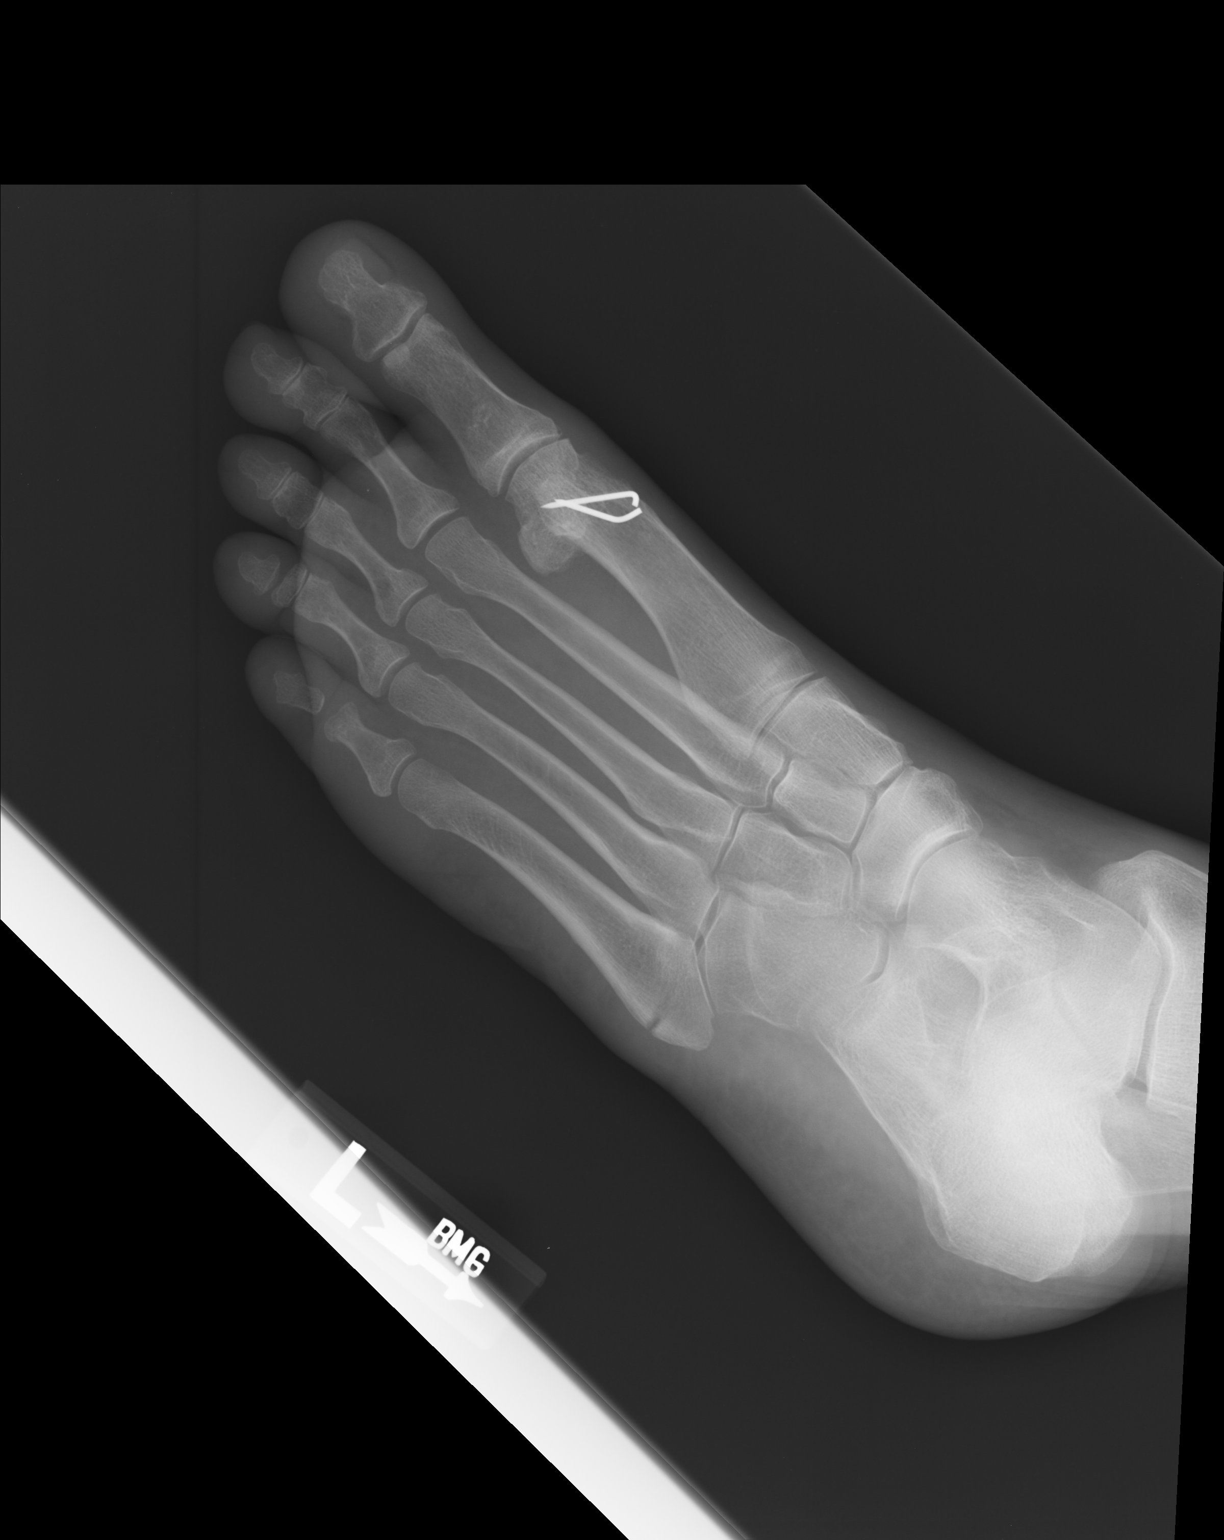

[2 of 2 positions shown; findings below may reference images not displayed]

FINDINGS: Frontal, oblique, and lateral views were obtained. There is
postoperative change in the distal first metatarsal with previous
bunionectomy.

There is a transversely oriented fracture of the proximal fifth
metatarsal with slight displacement of fracture fragments in this
area. No other fractures. No dislocation. Joint spaces appear
intact. No erosive change. There is a minimal inferior calcaneal
spur.
IMPRESSION: Slightly displaced fracture proximal fifth metatarsal. Postoperative
change distal first metatarsal. No other fractures. No dislocation.

## 2016-02-24 IMAGING — CR DG ANKLE COMPLETE 3+V*L*
4 series · 4 of 4 positions shown · non-contrast
Comparison: None.

CLINICAL DATA: Left ankle injury, initial encounter. Lateral ankle
pain after a fall this afternoon.

EXAM:
LEFT ANKLE COMPLETE - 3+ VIEW

[AP]
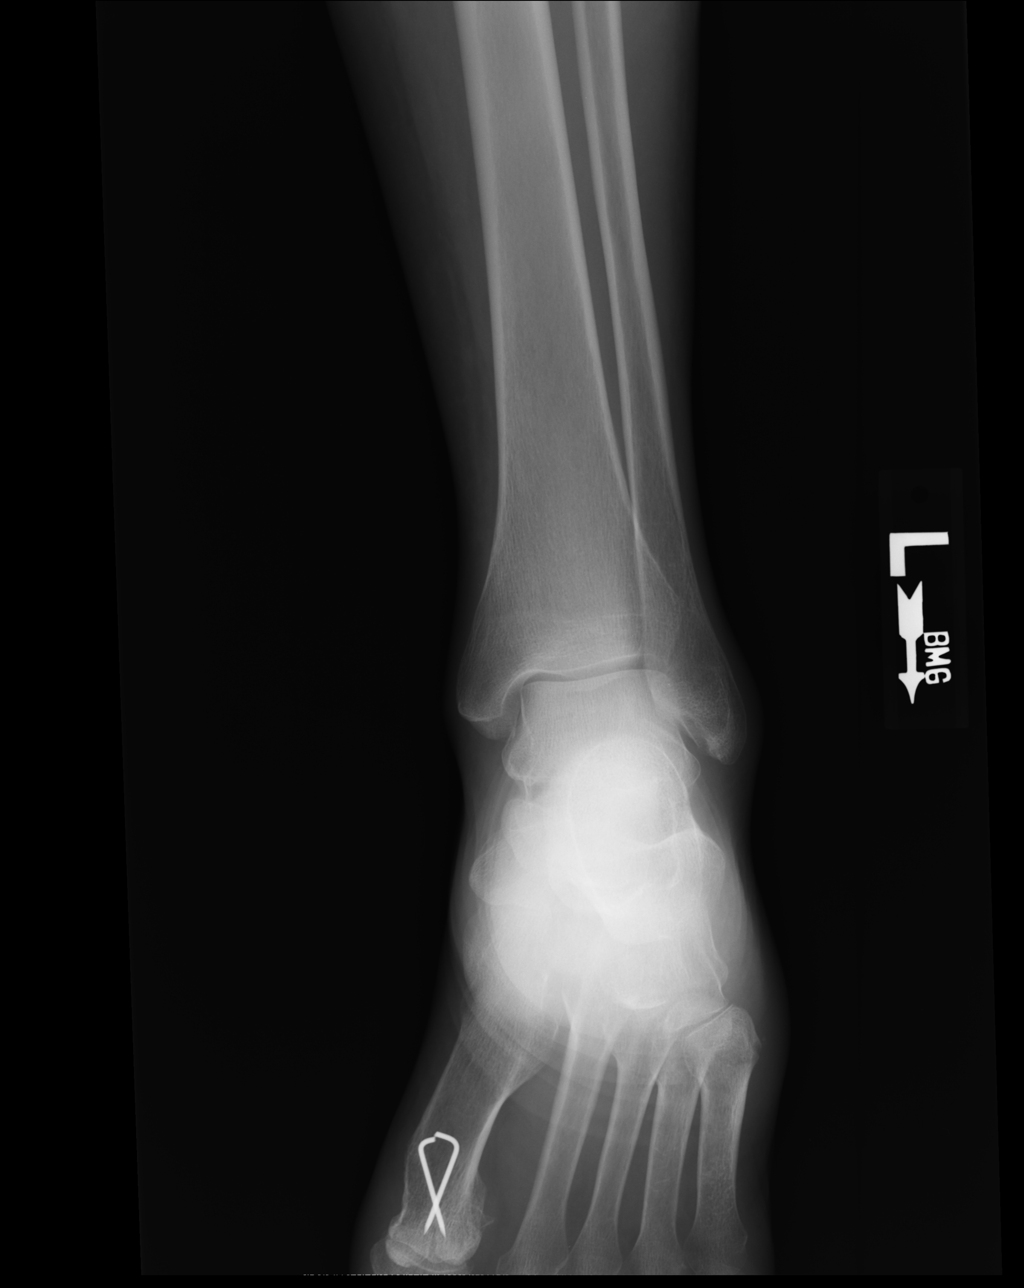

[ap obl int rot]
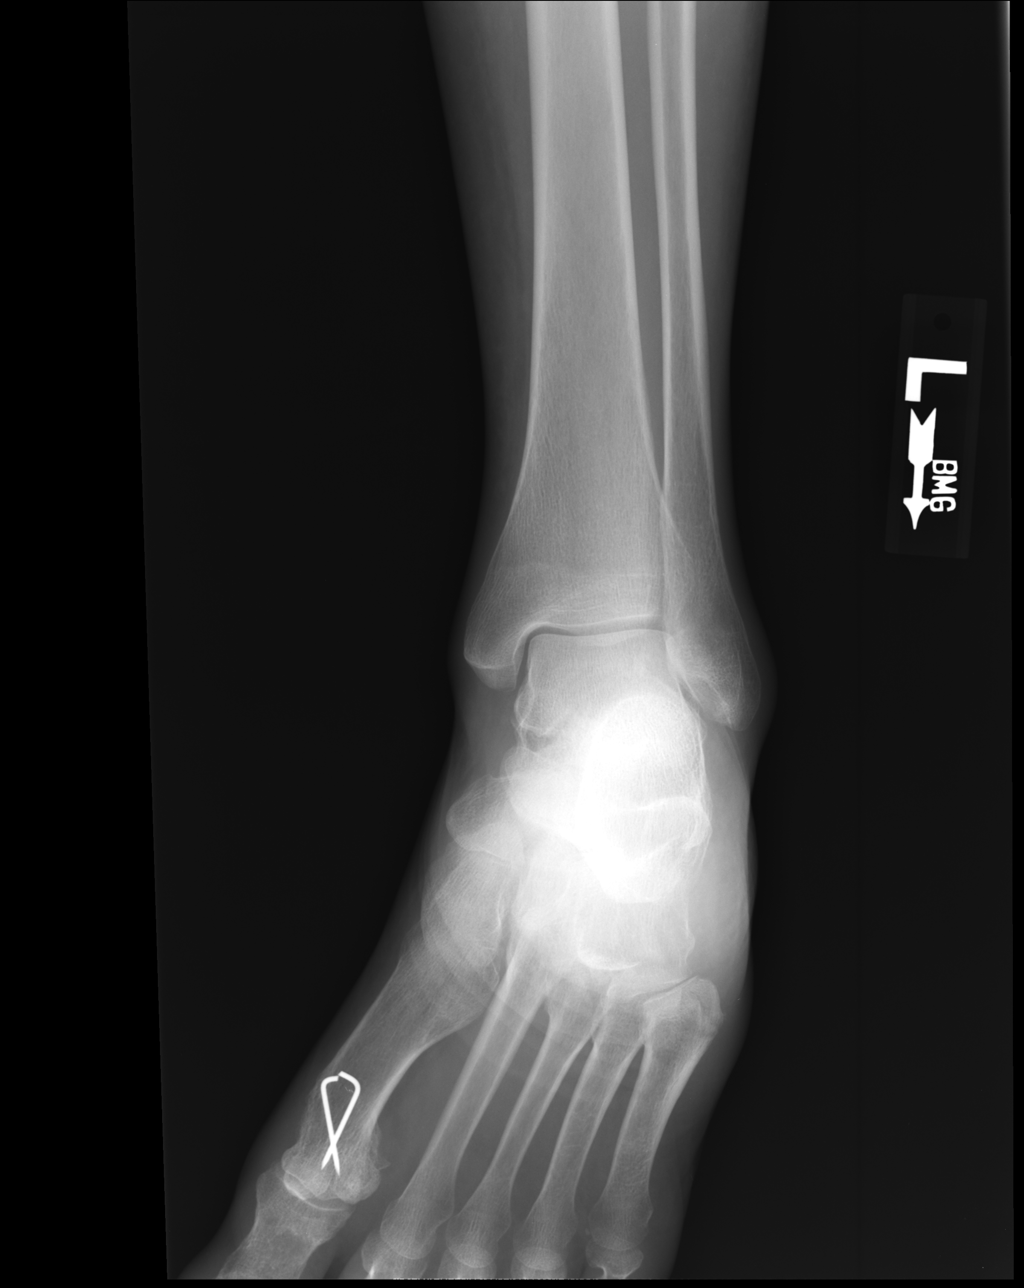

[medial obl]
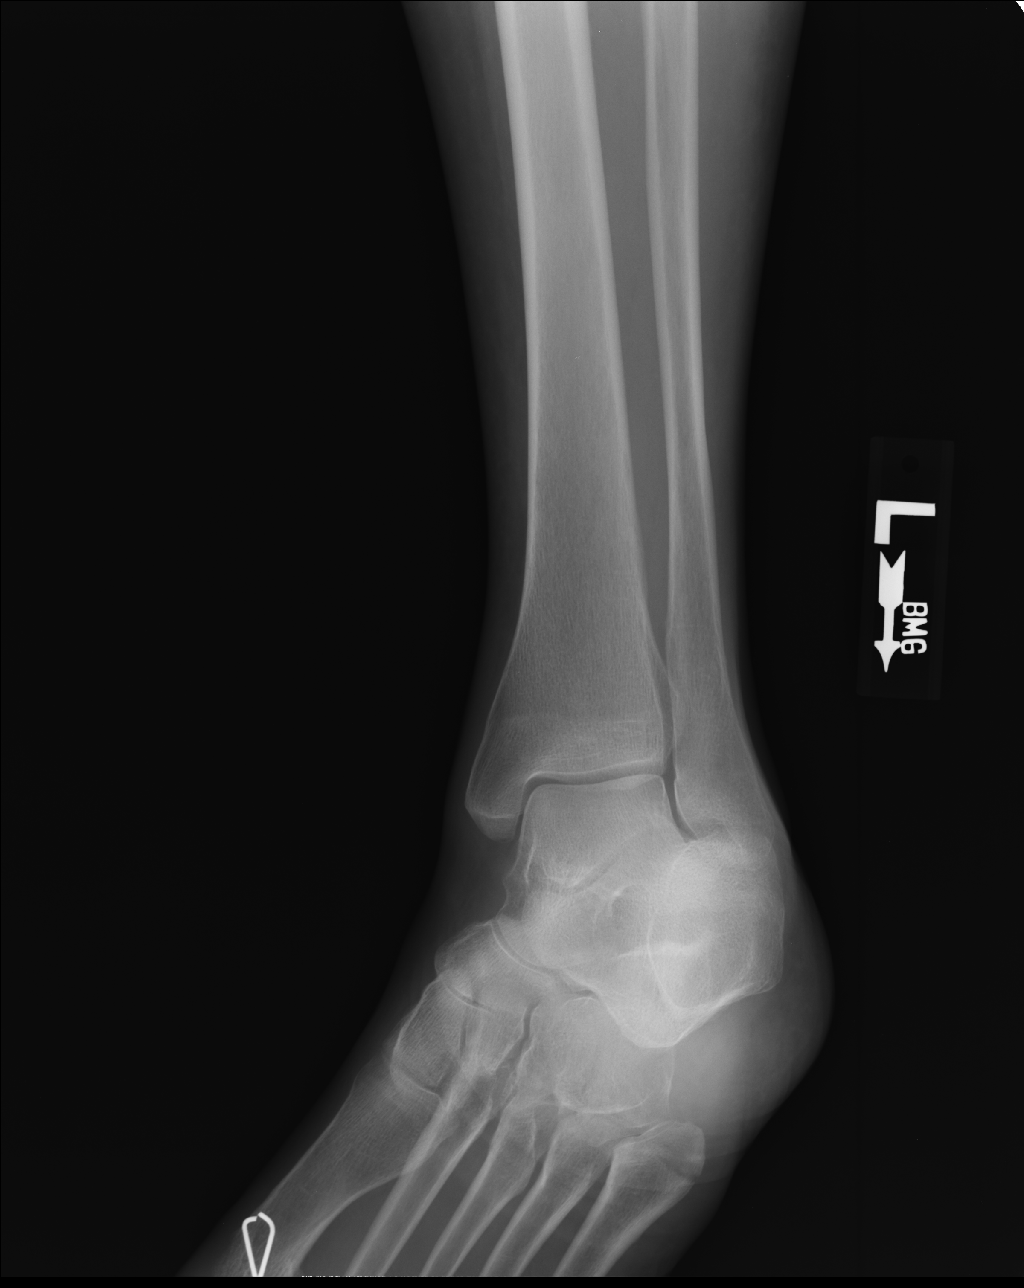

[lateral]
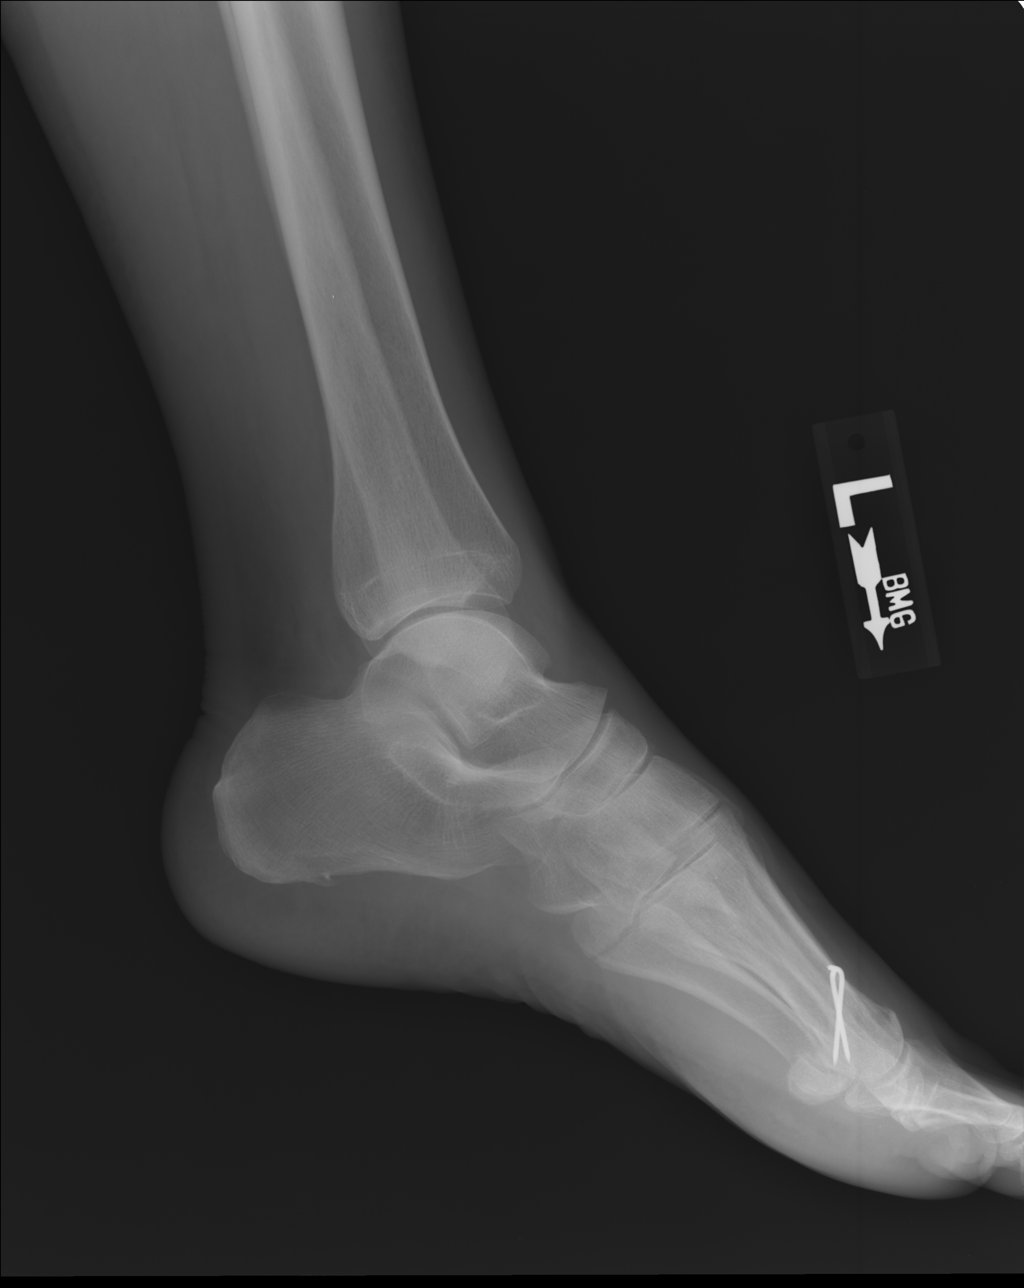

[4 of 4 positions shown; findings below may reference images not displayed]

FINDINGS: Distal tibia and fibula appear intact. No dislocation. There is a
minimally displaced transverse fracture through the base of the
fifth metatarsal. There is evidence of prior surgery involving the
distal first metatarsal with 2 fixation pins in place. No lytic or
blastic osseous lesion or soft tissue abnormality is identified.
IMPRESSION: Minimally displaced proximal fifth metatarsal fracture.

## 2016-03-13 DIAGNOSIS — M19042 Primary osteoarthritis, left hand: Secondary | ICD-10-CM | POA: Diagnosis not present

## 2016-05-25 ENCOUNTER — Other Ambulatory Visit: Payer: PPO

## 2016-05-30 ENCOUNTER — Ambulatory Visit: Payer: PPO | Admitting: Endocrinology

## 2016-11-21 ENCOUNTER — Ambulatory Visit (INDEPENDENT_AMBULATORY_CARE_PROVIDER_SITE_OTHER): Payer: PPO

## 2016-11-21 ENCOUNTER — Encounter: Payer: Self-pay | Admitting: Emergency Medicine

## 2016-11-21 ENCOUNTER — Ambulatory Visit (INDEPENDENT_AMBULATORY_CARE_PROVIDER_SITE_OTHER): Payer: PPO | Admitting: Family Medicine

## 2016-11-21 VITALS — BP 144/76 | HR 105 | Temp 98.1°F | Resp 18

## 2016-11-21 DIAGNOSIS — J441 Chronic obstructive pulmonary disease with (acute) exacerbation: Secondary | ICD-10-CM

## 2016-11-21 DIAGNOSIS — R059 Cough, unspecified: Secondary | ICD-10-CM

## 2016-11-21 DIAGNOSIS — R05 Cough: Secondary | ICD-10-CM

## 2016-11-21 DIAGNOSIS — J45901 Unspecified asthma with (acute) exacerbation: Secondary | ICD-10-CM

## 2016-11-21 MED ORDER — PREDNISONE 10 MG PO TABS: ORAL_TABLET | ORAL | 0 refills | Status: DC

## 2016-11-21 MED ORDER — IPRATROPIUM BROMIDE 0.02 % IN SOLN
0.5000 mg | Freq: Once | RESPIRATORY_TRACT | Status: AC
  Administered 2016-11-21: 0.5 mg via RESPIRATORY_TRACT

## 2016-11-21 MED ORDER — HYDROCODONE-HOMATROPINE 5-1.5 MG/5ML PO SYRP: 5.0000 mL | ORAL_SOLUTION | Freq: Three times a day (TID) | ORAL | 0 refills | Status: DC | PRN

## 2016-11-21 MED ORDER — ALBUTEROL SULFATE HFA 108 (90 BASE) MCG/ACT IN AERS: 2.0000 | INHALATION_SPRAY | Freq: Four times a day (QID) | RESPIRATORY_TRACT | 0 refills | Status: AC | PRN

## 2016-11-21 MED ORDER — METHYLPREDNISOLONE ACETATE 80 MG/ML IJ SUSP
80.0000 mg | Freq: Once | INTRAMUSCULAR | Status: AC
  Administered 2016-11-21: 80 mg via INTRAMUSCULAR

## 2016-11-21 MED ORDER — ALBUTEROL SULFATE (2.5 MG/3ML) 0.083% IN NEBU
2.5000 mg | INHALATION_SOLUTION | Freq: Once | RESPIRATORY_TRACT | Status: AC
  Administered 2016-11-21: 2.5 mg via RESPIRATORY_TRACT

## 2016-11-21 NOTE — Progress Notes (Signed)
   Nicole Delgado is a 66 y.o. female who presents to Primary Care at [facility] today for coughing:  1.  Coughing:  Present for past several days.  Has been worsening in intensity. She does have a diagnosis of asthma. She has an albuterol inhaler at home and took this multiple times yesterday without any relief.  Did see that her inhaler expired in 2016.  Cough is dry and nonproductive.  Has had difficulty sleeping at night due to cough.  Has asthma exacerbations once per year around this time.    Has had some mild viral URI symptoms for same period of time.  Hasn't had to take anything for this.    No fevers or chills.  No chest pain.  No LE edema.  No recent travel.    ROS as above.    PMH reviewed. Patient is a nonsmoker.   Past Medical History:  Diagnosis Date  . Allergy   . Asthma   . Keratoconus    followed by optho at [place], on retinal transplant list, legally blind   Past Surgical History:  Procedure Laterality Date  . ABDOMINAL HYSTERECTOMY  1995  . BREAST SURGERY     breast biopies x4  . rotator cuff surgery      Medications reviewed. Current Outpatient Prescriptions  Medication Sig Dispense Refill  . albuterol (PROVENTIL HFA;VENTOLIN HFA) 108 (90 BASE) MCG/ACT inhaler Inhale 2 puffs into the lungs every 6 (six) hours as needed for wheezing or shortness of breath. 1 Inhaler 0  . cholecalciferol (VITAMIN D) 1000 units tablet Take 1,000 Units by mouth daily.    Marland Kitchen GLUCOSAMINE-CHONDROITIN PO Take 1 tablet by mouth 2 (two) times daily with a meal.    . Multiple Vitamin (MULTIVITAMIN) capsule Take 1 capsule by mouth daily.    . pravastatin (PRAVACHOL) 40 MG tablet Take 1 tablet (40 mg total) by mouth daily. (Patient not taking: Reported on 12/29/2015) 90 tablet 0  . traZODone (DESYREL) 50 MG tablet Take 1-2 tablets (50-100 mg total) by mouth at bedtime as needed for sleep. (Patient not taking: Reported on 11/21/2016) 180 tablet 3   No current facility-administered medications for  this visit.      Physical Exam:  BP (!) 144/76 (BP Location: Right Arm, Patient Position: Sitting, Cuff Size: Large)   Pulse (!) 105   Temp 98.1 F (36.7 C) (Oral)   Resp 18   SpO2 98%  Gen:  Alert, cooperative patient who appears stated age in no acute distress.  Vital signs reviewed. HEENT: EOMI,  MMM.  Keratoconus noted.  Wearing contacts BL.   Pulm:  Wheezing in all lung fields on initial exam.   Cardiac:  Regular rate and rhythm.   Abd:  Soft/nondistended/nontender.   Exts: No LE edema  Assessment and Plan:  1.  Asthma exacerbation: - treated with duonebs here.   - negative CXR - no evidence of other intrathoracic or lung issues - much better exam s/p duonebs -- wheezing much improved. - treating with steroids -- Depomedrol here plus prednisone at home - I have given her a new albuterol inhaler.  - Hycodan for symptomatic relief.  States this has helped her previously during exacerbations.

## 2016-11-21 NOTE — Patient Instructions (Addendum)
It was good to meet you today.  You have had an exacerbation of your asthma.   To treat this, we'll do the following:   Steroid shot here.  Starting tomorrow, take the prednisone as follows: Take 6 pills today, 5 pills tomorrow, 4 pills today after, 3 pills after, 2 pills after, 1 pill last day.  Use the albuterol inhaler for cough and trouble breathing every 4 - 6 hours as needed.  Use the cough syrup to help with your cough as well. Do not drive with this.  If you're not better in the next few days, come back to see [place].  This time of year you might want to start an allergy medicine like Claritin or Zyrtec to help prevent this from happening.      IF you received an x-ray today, you will receive an invoice from [facility]. Please contact [facility] at [phone] with questions or concerns regarding your invoice.   IF you received labwork today, you will receive an invoice from [place]. Please contact [facility] at [phone] with questions or concerns regarding your invoice.   Our billing staff will not be able to assist you with questions regarding bills from these companies.  You will be contacted with the lab results as soon as they are available. The fastest way to get your results is to activate your My Chart account. Instructions are located on the last page of this paperwork. If you have not heard from [place] regarding the results in 2 weeks, please contact this office.

## 2016-11-22 DIAGNOSIS — Z01419 Encounter for gynecological examination (general) (routine) without abnormal findings: Secondary | ICD-10-CM | POA: Diagnosis not present

## 2016-11-22 DIAGNOSIS — Z1231 Encounter for screening mammogram for malignant neoplasm of breast: Secondary | ICD-10-CM | POA: Diagnosis not present

## 2016-11-26 ENCOUNTER — Ambulatory Visit (INDEPENDENT_AMBULATORY_CARE_PROVIDER_SITE_OTHER): Payer: PPO

## 2016-11-26 ENCOUNTER — Ambulatory Visit (INDEPENDENT_AMBULATORY_CARE_PROVIDER_SITE_OTHER): Payer: PPO | Admitting: Family Medicine

## 2016-11-26 ENCOUNTER — Encounter: Payer: Self-pay | Admitting: Family Medicine

## 2016-11-26 VITALS — BP 158/82 | HR 101 | Temp 98.5°F | Resp 16 | Ht 66.14 in | Wt 156.0 lb

## 2016-11-26 DIAGNOSIS — R062 Wheezing: Secondary | ICD-10-CM

## 2016-11-26 DIAGNOSIS — R059 Cough, unspecified: Secondary | ICD-10-CM

## 2016-11-26 DIAGNOSIS — R05 Cough: Secondary | ICD-10-CM | POA: Diagnosis not present

## 2016-11-26 DIAGNOSIS — J45909 Unspecified asthma, uncomplicated: Secondary | ICD-10-CM

## 2016-11-26 LAB — POCT CBC
GRANULOCYTE PERCENT: 75.2 % (ref 37–80)
HCT, POC: 39.4 % (ref 37.7–47.9)
Hemoglobin: 13.4 g/dL (ref 12.2–16.2)
Lymph, poc: 2.9 (ref 0.6–3.4)
MCH: 28.5 pg (ref 27–31.2)
MCHC: 34 g/dL (ref 31.8–35.4)
MCV: 83.7 fL (ref 80–97)
MID (CBC): 0.2 (ref 0–0.9)
MPV: 7.7 fL (ref 0–99.8)
PLATELET COUNT, POC: 252 10*3/uL (ref 142–424)
POC Granulocyte: 9.4 — AB (ref 2–6.9)
POC LYMPH PERCENT: 23.2 %L (ref 10–50)
POC MID %: 1.6 %M (ref 0–12)
RBC: 4.7 M/uL (ref 4.04–5.48)
RDW, POC: 12.7 %
WBC: 12.5 10*3/uL — AB (ref 4.6–10.2)

## 2016-11-26 MED ORDER — AZITHROMYCIN 250 MG PO TABS: ORAL_TABLET | ORAL | 0 refills | Status: DC

## 2016-11-26 MED ORDER — RANITIDINE HCL 150 MG PO TABS: 150.0000 mg | ORAL_TABLET | Freq: Every day | ORAL | 1 refills | Status: DC

## 2016-11-26 MED ORDER — IPRATROPIUM BROMIDE 0.02 % IN SOLN
0.5000 mg | Freq: Once | RESPIRATORY_TRACT | Status: AC
  Administered 2016-11-26: 0.5 mg via RESPIRATORY_TRACT

## 2016-11-26 MED ORDER — HYDROCODONE-HOMATROPINE 5-1.5 MG/5ML PO SYRP: 5.0000 mL | ORAL_SOLUTION | Freq: Three times a day (TID) | ORAL | 0 refills | Status: DC | PRN

## 2016-11-26 MED ORDER — ALBUTEROL SULFATE (2.5 MG/3ML) 0.083% IN NEBU
2.5000 mg | INHALATION_SOLUTION | Freq: Once | RESPIRATORY_TRACT | Status: AC
Start: 2016-11-26 — End: 2016-11-26
  Administered 2016-11-26: 2.5 mg via RESPIRATORY_TRACT

## 2016-11-26 MED ORDER — ALBUTEROL SULFATE (2.5 MG/3ML) 0.083% IN NEBU
2.5000 mg | INHALATION_SOLUTION | Freq: Once | RESPIRATORY_TRACT | Status: AC
  Administered 2016-11-26: 2.5 mg via RESPIRATORY_TRACT

## 2016-11-26 MED ORDER — BENZONATATE 100 MG PO CAPS: 100.0000 mg | ORAL_CAPSULE | Freq: Two times a day (BID) | ORAL | 0 refills | Status: DC | PRN

## 2016-11-26 NOTE — Patient Instructions (Addendum)
Thank you for coming in today, it was so nice to see you! Today we talked about:    Cough: This is likely from worsening asthma. Please use your albuterol inhaler every 4 hours. If you have any worsening shortness of breath not relieved with albuterol, please go to the ER.   Take your prednisone 5 pills tonight  Take the other medications as prescribed    If you have wheezing do not use the cough medication   You can try warm tea with honey  Stop the lemon juice   Please follow up tomorrow morning with Dr. Nat Christen. You can schedule this appointment at the front desk before you leave or call the clinic.   Sincerely,  Smitty Cords, MD     IF you received an x-ray today, you will receive an invoice from [facility]. Please contact [facility] at [phone] with questions or concerns regarding your invoice.   IF you received labwork today, you will receive an invoice from [place]. Please contact [facility] at [phone] with questions or concerns regarding your invoice.   Our billing staff will not be able to assist you with questions regarding bills from these companies.  You will be contacted with the lab results as soon as they are available. The fastest way to get your results is to activate your My Chart account. Instructions are located on the last page of this paperwork. If you have not heard from [place] regarding the results in 2 weeks, please contact this office.     Asthma Attack Prevention, Adult Although you may not be able to control the fact that you have asthma, you can take actions to prevent episodes of asthma (asthma attacks). These actions include:  Creating a written plan for managing and treating your asthma attacks (asthma action plan).  Monitoring your asthma.  Avoiding things that can irritate your airways or make your asthma symptoms worse (asthma triggers).  Taking your medicines as directed.  Acting quickly if you have signs  or symptoms of an asthma attack.  What are some ways to prevent an asthma attack? Create a plan Work with your health care provider to create an asthma action plan. This plan should include:  A list of your asthma triggers and how to avoid them.  A list of symptoms that you experience during an asthma attack.  Information about when to take medicine and how much medicine to take.  Information to help you understand your peak flow measurements.  Contact information for your health care providers.  Daily actions that you can take to control asthma.  Monitor your asthma  To monitor your asthma:  Use your peak flow meter every morning and every evening for 2-3 weeks. Record the results in a journal. A drop in your peak flow numbers on one or more days may mean that you are starting to have an asthma attack, even if you are not having symptoms.  When you have asthma symptoms, write them down in a journal.  Avoid asthma triggers  Work with your health care provider to find out what your asthma triggers are. This can be done by:  Being tested for allergies.  Keeping a journal that notes when asthma attacks occur and what may have contributed to them.  Asking your health care provider whether other medical conditions make your asthma worse.  Common asthma triggers include:  Dust.  Smoke. This includes campfire smoke and secondhand smoke from tobacco products.  Pet dander.  Trees, grasses  or pollens.  Very cold, dry, or humid air.  Mold.  Foods that contain high amounts of sulfites.  Strong smells.  Engine exhaust and air pollution.  Aerosol sprays and fumes from household cleaners.  Household pests and their droppings, including dust mites and cockroaches.  Certain medicines, including NSAIDs.  Once you have determined your asthma triggers, take steps to avoid them. Depending on your triggers, you may be able to reduce the chance of an asthma attack  by:  Keeping your home clean. Have someone dust and vacuum your home for you 1 or 2 times a week. If possible, have them use a high-efficiency particulate arrestance (HEPA) vacuum.  Washing your sheets weekly in hot water.  Using allergy-proof mattress covers and casings on your bed.  Keeping pets out of your home.  Taking care of mold and water problems in your home.  Avoiding areas where people smoke.  Avoiding using strong perfumes or odor sprays.  Avoid spending a lot of time outdoors when pollen counts are high and on very windy days.  Talking with your health care provider before stopping or starting any new medicines.  Medicines Take over-the-counter and prescription medicines only as told by your health care provider. Many asthma attacks can be prevented by carefully following your medicine schedule. Taking your medicines correctly is especially important when you cannot avoid certain asthma triggers. Even if you are doing well, do not stop taking your medicine and do not take less medicine. Act quickly If an asthma attack happens, acting quickly can decrease how severe it is and how long it lasts. Take these actions:  Pay attention to your symptoms. If you are coughing, wheezing, or having difficulty breathing, do not wait to see if your symptoms go away on their own. Follow your asthma action plan.  If you have followed your asthma action plan and your symptoms are not improving, call your health care provider or seek immediate medical care at the nearest hospital.  It is important to write down how often you need to use your fast-acting rescue inhaler. You can track how often you use an inhaler in your journal. If you are using your rescue inhaler more often, it may mean that your asthma is not under control. Adjusting your asthma treatment plan may help you to prevent future asthma attacks and help you to gain better control of your condition. How can I prevent an asthma  attack when I exercise?  Exercise is a common asthma trigger. To prevent asthma attacks during exercise:  Follow advice from your health care provider about whether you should use your fast-acting inhaler before exercising. Many people with asthma experience exercise-induced bronchoconstriction (EIB). This condition often worsens during vigorous exercise in cold, humid, or dry environments. Usually, people with EIB can stay very active by using a fast-acting inhaler before exercising.  Avoid exercising outdoors in very cold or humid weather.  Avoid exercising outdoors when pollen counts are high.  Warm up and cool down when exercising.  Stop exercising right away if asthma symptoms start.  Consider taking part in exercises that are less likely to cause asthma symptoms such as:  Indoor swimming.  Biking.  Walking.  Hiking.  Playing football.  This information is not intended to replace advice given to you by your health care provider. Make sure you discuss any questions you have with your health care provider. Document Released: 05/16/2009 Document Revised: 01/27/2016 Document Reviewed: 11/12/2015 Elsevier Interactive Patient Education  2017 [employer].

## 2016-11-26 NOTE — Progress Notes (Signed)
Nicole Delgado is a 66 y.o. female who presents to Primary Care at [facility] today for  Chief Complaint  Patient presents with  . Cough    x 2 weeks with neck and pain   1. COUGH  Onset: 2 weeks ago it started Course Patient states it has been getting progressively worse since being seen in our clinic on 6/13.  Severity: Severe per patient report Worse with: ambulation and movement  Better with: laying down and staying still  Tried using cough syrup and albuterol  Prescribed a steroid pack but has only taken a couple days of it as she picked up her script late Patient also endorsing neck and abdominal pain from coughing Admits to some post tussive emesis  x2 and some GERD   Symptoms Sputum:no  Fever: no  Shortness of breath:yes  Leg Swelling:no  Heart Burn or Reflux:no  Wheezing:yes  Post Nasal Drip: no   Red Flags Weight Loss:  no Immunocompromised:  no  PMH Asthma or COPD: yes. Using Albuterol inhaler several times a day. She has been hospitalized before for asthma but has not been intubated. The last time she was hospitalized was 1 year ago PMH of Smoking: no  Using ACEIs: no   ROS as above.  Pertinently, no chest pain, palpitations, Fever, Chills, Abd pain, N/V/D.   PMH reviewed. Patient is a nonsmoker.   Past Medical History:  Diagnosis Date  . Allergy   . Asthma   . Keratoconus    followed by optho at [place], on retinal transplant list, legally blind   Past Surgical History:  Procedure Laterality Date  . ABDOMINAL HYSTERECTOMY  1995  . BREAST SURGERY     breast biopies x4  . rotator cuff surgery      Medications reviewed. Current Outpatient Prescriptions  Medication Sig Dispense Refill  . albuterol (PROVENTIL HFA;VENTOLIN HFA) 108 (90 Base) MCG/ACT inhaler Inhale 2 puffs into the lungs every 6 (six) hours as needed for wheezing or shortness of breath. 1 Inhaler 0  . cholecalciferol (VITAMIN D) 1000 units tablet Take 1,000 Units by mouth daily.    Marland Kitchen  GLUCOSAMINE-CHONDROITIN PO Take 1 tablet by mouth 2 (two) times daily with a meal.    . HYDROcodone-homatropine (HYCODAN) 5-1.5 MG/5ML syrup Take 5 mLs by mouth every 8 (eight) hours as needed for cough. 120 mL 0  . Multiple Vitamin (MULTIVITAMIN) capsule Take 1 capsule by mouth daily.    . pravastatin (PRAVACHOL) 40 MG tablet Take 1 tablet (40 mg total) by mouth daily. 90 tablet 0  . traZODone (DESYREL) 50 MG tablet Take 1-2 tablets (50-100 mg total) by mouth at bedtime as needed for sleep. 180 tablet 3   No current facility-administered medications for this visit.    Physical Exam:  BP (!) 158/82   Pulse (!) 101   Temp 98.5 F (36.9 C) (Oral)   Resp 16   Ht 5' 6.14" (1.68 m)   Wt 156 lb (70.8 kg)   SpO2 99%   BMI 25.07 kg/m  Gen:  Alert, cooperative patient who appears stated age, in mild respiratory distress.  Vital signs reviewed. HEENT: EOMI,  MMM Pulm: Tachypneic, decreased air movement with faint expiratory wheezing. No rales or crackles appreciated Cardiac: Regular rate and rhythm without murmur auscultated.  Good S1/S2. Abd: Soft/nondistended/nontender.  Good bowel sounds throughout all four quadrants.  No masses noted.  Exts: Non edematous BL  LE, warm and well perfused.   Assessment and Plan:  1.  Asthma exacerbation with persistent cough and wheezing: s/p negative CXR, duonebs, and depomedrol on 6/13 at our clinic for asthma exacerbation. Duonebs x 2 provided in clinic today with relief. No obvious infectious signs with negative CXR and no fever. However, patient could have an infection still. There is mild leukocystosis on CBC which could either be reactive from steroids or early infection. Since patient improved with duonebs, we will try management at home. The plan is as follows: - Continue Albuterol PRN - Will increase steroids to 60 mg today - Continue hycodan cough syrup  - z-pack given - Ranitidine 150 mg qhs in case there is a GERD component - Tessalon perles -  Discussed reasons to go to the ED - Follow up tomorrow morning in clinic to ensure she is not worsening  Smitty Cords, MD [place], PGY-2

## 2016-11-27 ENCOUNTER — Encounter: Payer: Self-pay | Admitting: Family Medicine

## 2016-11-27 ENCOUNTER — Ambulatory Visit (INDEPENDENT_AMBULATORY_CARE_PROVIDER_SITE_OTHER): Payer: PPO | Admitting: Family Medicine

## 2016-11-27 VITALS — BP 142/84 | HR 106 | Temp 98.4°F | Resp 16 | Ht 66.14 in | Wt 154.4 lb

## 2016-11-27 DIAGNOSIS — J441 Chronic obstructive pulmonary disease with (acute) exacerbation: Secondary | ICD-10-CM

## 2016-11-27 DIAGNOSIS — Z5181 Encounter for therapeutic drug level monitoring: Secondary | ICD-10-CM | POA: Diagnosis not present

## 2016-11-27 DIAGNOSIS — J45901 Unspecified asthma with (acute) exacerbation: Secondary | ICD-10-CM

## 2016-11-27 DIAGNOSIS — R059 Cough, unspecified: Secondary | ICD-10-CM

## 2016-11-27 DIAGNOSIS — R05 Cough: Secondary | ICD-10-CM

## 2016-11-27 DIAGNOSIS — R030 Elevated blood-pressure reading, without diagnosis of hypertension: Secondary | ICD-10-CM | POA: Diagnosis not present

## 2016-11-27 NOTE — Patient Instructions (Addendum)
Albuterol can be used for cough every 4 hours As your cough gets better decrease your use  And then go to as needed    IF you received an x-ray today, you will receive an invoice from [facility]. Please contact [facility] at [phone] with questions or concerns regarding your invoice.   IF you received labwork today, you will receive an invoice from [place]. Please contact [facility] at [phone] with questions or concerns regarding your invoice.   Our billing staff will not be able to assist you with questions regarding bills from these companies.  You will be contacted with the lab results as soon as they are available. The fastest way to get your results is to activate your My Chart account. Instructions are located on the last page of this paperwork. If you have not heard from [place] regarding the results in 2 weeks, please contact this office.     How to Take Your Blood Pressure You can take your blood pressure at home with a machine. You may need to check your blood pressure at home:  To check if you have high blood pressure (hypertension).  To check your blood pressure over time.  To make sure your blood pressure medicine is working.  Supplies needed: You will need a blood pressure machine, or monitor. You can buy one at a drugstore or online. When choosing one:  Choose one with an arm cuff.  Choose one that wraps around your upper arm. Only one finger should fit between your arm and the cuff.  Do not choose one that measures your blood pressure from your wrist or finger.  Your doctor can suggest a monitor. How to prepare Avoid these things for 30 minutes before checking your blood pressure:  Drinking caffeine.  Drinking alcohol.  Eating.  Smoking.  Exercising.  Five minutes before checking your blood pressure:  Pee.  Sit in a dining chair. Avoid sitting in a soft couch or armchair.  Be quiet. Do not talk.  How to take your blood  pressure Follow the instructions that came with your machine. If you have a digital blood pressure monitor, these may be the instructions: 1. Sit up straight. 2. Place your feet on the floor. Do not cross your ankles or legs. 3. Rest your left arm at the level of your heart. You may rest it on a table, desk, or chair. 4. Pull up your shirt sleeve. 5. Wrap the blood pressure cuff around the upper part of your left arm. The cuff should be 1 inch (2.5 cm) above your elbow. It is best to wrap the cuff around bare skin. 6. Fit the cuff snugly around your arm. You should be able to place only one finger between the cuff and your arm. 7. Put the cord inside the groove of your elbow. 8. Press the power button. 9. Sit quietly while the cuff fills with air and loses air. 10. Write down the numbers on the screen. 11. Wait 2-3 minutes and then repeat steps 1-10.  What do the numbers mean? Two numbers make up your blood pressure. The first number is called systolic pressure. The second is called diastolic pressure. An example of a blood pressure reading is "120 over 80" (or 120/80). If you are an adult and do not have a medical condition, use this guide to find out if your blood pressure is normal: Normal  First number: below 120.  Second number: below 80. Elevated  First number: 120-129.  Second  number: below 80. Hypertension stage 1  First number: 130-139.  Second number: 80-89. Hypertension stage 2  First number: 140 or above.  Second number: 83 or above. Your blood pressure is above normal even if only the top or bottom number is above normal. Follow these instructions at home:  Check your blood pressure as often as your doctor tells you to.  Take your monitor to your next doctor's appointment. Your doctor will: ? Make sure you are using it correctly. ? Make sure it is working right.  Make sure you understand what your blood pressure numbers should be.  Tell your doctor if your  medicines are causing side effects. Contact a doctor if:  Your blood pressure keeps being high. Get help right away if:  Your first blood pressure number is higher than 180.  Your second blood pressure number is higher than 120. This information is not intended to replace advice given to you by your health care provider. Make sure you discuss any questions you have with your health care provider. Document Released: 05/10/2008 Document Revised: 04/25/2016 Document Reviewed: 11/04/2015 Elsevier Interactive Patient Education  [employer].

## 2016-11-27 NOTE — Progress Notes (Signed)
Chief Complaint  Patient presents with  . Wheezing    followup  . Cough    followup    HPI   Pt reports that she was able to sleep through the night She states that the cough is getting better and the only time when she started coughing again was this morning in the heat She states that she was getting constipated from the prednisone and cough syrups. She finished her prednisone and the hycodan.  She stopped her lozenges and lemon juice.  She started drinking some water and that is helping to loosen her mucus She reports that she was not eating much and was low energy Now she is eating and feels better BP Readings from Last 3 Encounters:  11/27/16 (!) 142/84  11/26/16 (!) 158/82  11/21/16 (!) 144/76     Past Medical History:  Diagnosis Date  . Allergy   . Asthma   . Keratoconus    followed by optho at [place], on retinal transplant list, legally blind    Current Outpatient Prescriptions  Medication Sig Dispense Refill  . albuterol (PROVENTIL HFA;VENTOLIN HFA) 108 (90 Base) MCG/ACT inhaler Inhale 2 puffs into the lungs every 6 (six) hours as needed for wheezing or shortness of breath. 1 Inhaler 0  . benzonatate (TESSALON) 100 MG capsule Take 1 capsule (100 mg total) by mouth 2 (two) times daily as needed for cough. 20 capsule 0  . cholecalciferol (VITAMIN D) 1000 units tablet Take 1,000 Units by mouth daily.    Marland Kitchen GLUCOSAMINE-CHONDROITIN PO Take 1 tablet by mouth 2 (two) times daily with a meal.    . Multiple Vitamin (MULTIVITAMIN) capsule Take 1 capsule by mouth daily.    . ranitidine (ZANTAC) 150 MG tablet Take 1 tablet (150 mg total) by mouth at bedtime. 60 tablet 1  . azithromycin (ZITHROMAX) 250 MG tablet Take 2 pills in first day and then 1 pill (Patient not taking: Reported on 11/27/2016) 6 each 0  . HYDROcodone-homatropine (HYCODAN) 5-1.5 MG/5ML syrup Take 5 mLs by mouth every 8 (eight) hours as needed for cough. (Patient not taking: Reported on 11/27/2016) 120 mL 0  .  pravastatin (PRAVACHOL) 40 MG tablet Take 1 tablet (40 mg total) by mouth daily. (Patient not taking: Reported on 11/27/2016) 90 tablet 0  . traZODone (DESYREL) 50 MG tablet Take 1-2 tablets (50-100 mg total) by mouth at bedtime as needed for sleep. (Patient not taking: Reported on 11/27/2016) 180 tablet 3   No current facility-administered medications for this visit.     Allergies:  Allergies  Allergen Reactions  . Bee Venom Swelling  . Penicillins Hives  . Valium [Diazepam] Other (See Comments)    Hallucinations    Past Surgical History:  Procedure Laterality Date  . ABDOMINAL HYSTERECTOMY  1995  . BREAST SURGERY     breast biopies x4  . rotator cuff surgery      Social History   Social History  . Marital status: Divorced    Spouse name: N/A  . Number of children: N/A  . Years of education: N/A   Occupational History  . Realtor    Social History Main Topics  . Smoking status: Never Smoker  . Smokeless tobacco: Never Used  . Alcohol use No  . Drug use: No  . Sexual activity: Yes    Birth control/ protection: Surgical   Other Topics Concern  . None   Social History Narrative  . None    ROS No fevers or chills  No nausea or vomiting Continues to have cough but no wheezing No skin rash or lesions  Objective: Vitals:   11/27/16 1016  BP: (!) 142/84  Pulse: (!) 106  Resp: 16  Temp: 98.4 F (36.9 C)  TempSrc: Oral  SpO2: 97%  Weight: 154 lb 6.4 oz (70 kg)  Height: 5' 6.14" (1.68 m)    Physical Exam General: alert, oriented, in NAD Head: normocephalic, atraumatic, no sinus tenderness Eyes: EOM intact, no scleral icterus or conjunctival injection Ears: TM clear bilaterally Nose: mucosa nonerythematous, nonedematous Throat: no pharyngeal exudate or erythema Lymph: no posterior auricular, submental or cervical lymph adenopathy Heart: normal rate, normal sinus rhythm, no murmurs Lungs: clear to auscultation bilaterally, no wheezing   Assessment and  Plan Nicole Delgado was seen today for wheezing and cough.  Diagnoses and all orders for this visit:  Elevated blood pressure reading- follow up with PCP for her blood pressure Discussed that she meets criteria for hypertension and that this is based on her elevated bp on separate visits She will need to have a discussion on how to manage this Pt defers to her PCP Dr. Delman Cheadle  Cough- improving  Medication monitoring encounter- Continue your current medications    Xavior Niazi A Nolon Rod

## 2016-11-28 NOTE — Progress Notes (Addendum)
Patient discussed, and examined with Dr. Juanito Doom. Agree with findings, assessment and plan of care per her note. Cover for atypicals or possible pertussis with azithro, appears to be reactive airway/asthma with improvement in office with nebs, increase prednisone dosing as initially improved, and cough suppressants discussed with precautions if wheezing. Follow up for recheck as planned. Neck, other muscular pains from repetitive coughing. Symptomatic care and RTC precautions. Over 40 minutes face to face care with greater than 50%counseling.

## 2016-11-29 ENCOUNTER — Other Ambulatory Visit: Payer: Self-pay | Admitting: Obstetrics and Gynecology

## 2016-11-29 DIAGNOSIS — R928 Other abnormal and inconclusive findings on diagnostic imaging of breast: Secondary | ICD-10-CM

## 2016-12-04 ENCOUNTER — Ambulatory Visit
Admission: RE | Admit: 2016-12-04 | Discharge: 2016-12-04 | Disposition: A | Discharge status: Home / Self Care | Payer: PPO | Source: Ambulatory Visit | Attending: Obstetrics and Gynecology | Admitting: Obstetrics and Gynecology

## 2016-12-04 ENCOUNTER — Other Ambulatory Visit: Payer: PPO

## 2016-12-04 DIAGNOSIS — R928 Other abnormal and inconclusive findings on diagnostic imaging of breast: Secondary | ICD-10-CM

## 2016-12-07 NOTE — Addendum Note (Signed)
Addended by: Delia Chimes A on: 12/07/2016 05:39 PM   Modules accepted: Orders

## 2016-12-25 ENCOUNTER — Other Ambulatory Visit (INDEPENDENT_AMBULATORY_CARE_PROVIDER_SITE_OTHER): Payer: PPO

## 2016-12-25 ENCOUNTER — Ambulatory Visit (INDEPENDENT_AMBULATORY_CARE_PROVIDER_SITE_OTHER): Payer: PPO | Admitting: Internal Medicine

## 2016-12-25 ENCOUNTER — Encounter: Payer: Self-pay | Admitting: Internal Medicine

## 2016-12-25 VITALS — BP 134/82 | HR 80 | Ht 66.0 in | Wt 153.0 lb

## 2016-12-25 DIAGNOSIS — J45991 Cough variant asthma: Secondary | ICD-10-CM

## 2016-12-25 LAB — CBC WITH DIFFERENTIAL/PLATELET
BASOS ABS: 0 10*3/uL (ref 0.0–0.1)
Basophils Relative: 0.5 % (ref 0.0–3.0)
EOS ABS: 0.1 10*3/uL (ref 0.0–0.7)
EOS PCT: 1.7 % (ref 0.0–5.0)
HCT: 42.5 % (ref 36.0–46.0)
HEMOGLOBIN: 13.9 g/dL (ref 12.0–15.0)
LYMPHS ABS: 2.6 10*3/uL (ref 0.7–4.0)
Lymphocytes Relative: 33.7 % (ref 12.0–46.0)
MCHC: 32.8 g/dL (ref 30.0–36.0)
MCV: 85.3 fl (ref 78.0–100.0)
MONO ABS: 0.6 10*3/uL (ref 0.1–1.0)
Monocytes Relative: 7.4 % (ref 3.0–12.0)
NEUTROS PCT: 56.7 % (ref 43.0–77.0)
Neutro Abs: 4.3 10*3/uL (ref 1.4–7.7)
Platelets: 199 10*3/uL (ref 150.0–400.0)
RBC: 4.98 Mil/uL (ref 3.87–5.11)
RDW: 13.5 % (ref 11.5–15.5)
WBC: 7.6 10*3/uL (ref 4.0–10.5)

## 2016-12-25 LAB — NITRIC OXIDE: NITRIC OXIDE: 6

## 2016-12-25 NOTE — Patient Instructions (Addendum)
The next time you start having coughing spells or increased need to clear your throat for any reason I strongly rec you try:  1) mucinex dm 1200 mg every 12 hours  2)   Try prilosec otc 20mg   Take 30-60 min before first meal of the day and Pepcid ac (famotidine) 20 mg one @  bedtime until cough is completely gone for at least a week without the need for cough suppression  GERD (REFLUX)  is an extremely common cause of respiratory symptoms just like yours , many times with no obvious heartburn at all.    It can be treated with medication, but also with lifestyle changes including elevation of the head of your bed (ideally with 6 inch  bed blocks),  Smoking cessation, avoidance of late meals, excessive alcohol, and avoid fatty foods, chocolate, peppermint, colas, red wine, and acidic juices such as orange juice.  NO MINT OR MENTHOL PRODUCTS SO NO COUGH DROPS   USE SUGARLESS CANDY INSTEAD (Jolley ranchers or Stover's or Life Savers) or even ice chips will also do - the key is to swallow to prevent all throat clearing. NO OIL BASED VITAMINS - use powdered substitutes.   Only use your albuterol as a rescue medication to be used if you can't catch your breath by resting or doing a relaxed purse lip breathing pattern.  - The less you use it, the better it will work when you need it. - Ok to use up to 2 puffs  every 4 hours if you must but call for immediate appointment if use goes up over your usual need - Don't leave home without it !!  (think of it like the spare tire for your car)    Please remember to go to the lab  department downstairs in the basement  for your tests - we will call you with the results when they are available.     Follow up here as needed for flare that doesn't respond to the above measures

## 2016-12-25 NOTE — Assessment & Plan Note (Signed)
FENO 12/25/2016  =    6  Allergy profile pending   Recurrent pattern of limited cough s response to pred/alb but better with heavy cough suppression, worse day than noct are all features in support of not asthma but rather Upper airway cough syndrome (previously labeled PNDS) , is  so named because it's frequently impossible to sort out how much is  CR/sinusitis with freq throat clearing (which can be related to primary GERD)   vs  causing  secondary (" extra esophageal")  GERD from wide swings in gastric pressure that occur with throat clearing, often  promoting self use of mint and menthol lozenges that reduce the lower esophageal sphincter tone and exacerbate the problem further in a cyclical fashion.   These are the same pts (now being labeled as having "irritable larynx syndrome" by some cough centers) who not infrequently have a history of having failed to tolerate ace inhibitors,  dry powder inhalers or biphosphonates or report having atypical/extraesophageal reflux symptoms that don't respond to standard doses of PPI  and are easily confused as having aecopd or asthma flares by even experienced allergists/ pulmonologists (myself included).   Of the three most common causes of  Sub-acute or recurrent or chronic cough, only one (GERD)  can actually contribute to/ trigger  the other two (asthma and post nasal drip syndrome)  and perpetuate the cylce of cough.  While not intuitively obvious, many patients with chronic low grade reflux do not cough until there is a primary insult that disturbs the protective epithelial barrier and exposes sensitive nerve endings.   This is typically viral (as is likely the case here) but can be direct physical injury such as with an endotracheal tube.   The point is that once this occurs, it is difficult to eliminate the cycle  using anything but a maximally effective acid suppression regimen at least in the short run, accompanied by an appropriate diet to address non  acid GERD and control / eliminate the cough itself for at least 3 days with mucinex dm initially and try to avoid narcotic cough meds > if not better needs ov to regroup    Total time devoted to counseling  > 50 % of initial 60 min office visit:  review case with pt/ discussion of options/alternatives/ personally creating written customized instructions  in presence of pt  then going over those specific  Instructions directly with the pt including how to use all of the meds but in particular covering each new medication in detail and the difference between the maintenance= "automatic" meds and the prns using an action plan format for the latter (If this problem/symptom => do that organization reading Left to right).  Please see AVS from this visit for a full list of these instructions which I personally wrote for this pt and  are unique to this visit.

## 2016-12-25 NOTE — Progress Notes (Signed)
Subjective:     Patient ID: Nicole Delgado, female   DOB: 1951/06/09,   MRN: [id number]  HPI   40 yobf never smoker healthy until age of 89 began having bad coughing spells  x sev weeks and needed pred shots / albuterol prn> always improved w/in a few weeks and better x months typically happened in late spring / summer referred to pulmonary clinic 12/25/2016 by Dr   Brigitte Pulse with episode starting early Jun 2018 refractory to 3 outpt visits.   12/25/2016 1st [facility] office visit/ Drexel Ivey   Chief Complaint  Patient presents with  . Pulmonary Consult    Referred by Dr. Delman Cheadle. Pt c/o cough for the past month. She states that for the past 20 yrs, she develops a cough in the early summer that last for only approx 1 wk. Her cough is non prod and can be triggered by humid weather. She states that she has not been coughing as much for the past wk. She has an albuterol inhaler that she has not had to use in the past wk.   typically assoc with abrupt episodes of sore throat/ stuffy nose "but not a cold" this year same pattern acute onset  and rx = prednisone/ cough meds/albterol better x for persistent urge to clear her throat   Did half marathons previously and 6 miles on day of ov s problem or need for saba  No ongoing throat/sinus complaints   No obvious day to day or daytime variability or assoc excess/ purulent sputum or mucus plugs or hemoptysis or cp or chest tightness, subjective wheeze or overt sinus or hb symptoms. No unusual exp hx or h/o childhood pna/ asthma or knowledge of premature birth.  Sleeping ok without nocturnal  or early am exacerbation  of respiratory  c/o's or need for noct saba. Also denies any obvious fluctuation of symptoms with weather or environmental changes or other aggravating or alleviating factors except as outlined above   Current Medications, Allergies, Complete Past Medical History, Past Surgical History, Family History, and Social History were reviewed in  [employer] record.  ROS  The following are not active complaints unless bolded sore throat, dysphagia, dental problems, itching, sneezing,  nasal congestion or excess/ purulent secretions, ear ache,   fever, chills, sweats, unintended wt loss, classically pleuritic or exertional cp,  orthopnea pnd or leg swelling, presyncope, palpitations, abdominal pain, anorexia, nausea, vomiting, diarrhea  or change in bowel or bladder habits, change in stools or urine, dysuria,hematuria,  rash, arthralgias, visual complaints, headache, numbness, weakness or ataxia or problems with walking or coordination,  change in mood/affect or memory.         Review of Systems     Objective:   Physical Exam     amb bf nad with vigorous throat clearing occasionally   Wt Readings from Last 3 Encounters:  12/25/16 153 lb (69.4 kg)  11/27/16 154 lb 6.4 oz (70 kg)  11/26/16 156 lb (70.8 kg)    Vital signs reviewed  - Note on arrival 02 sats  96% on RA      HEENT: nl dentition, turbinates bilaterally, and oropharynx. Nl external ear canals without cough reflex   NECK :  without JVD/Nodes/TM/ nl carotid upstrokes bilaterally   LUNGS: no acc muscle use,  Nl contour chest which is clear to A and P bilaterally without cough on insp or exp maneuvers   CV:  RRR  no s3 or murmur or increase in  P2, and no edema   ABD:  soft and nontender with nl inspiratory excursion in the supine position. No bruits or organomegaly appreciated, bowel sounds nl  MS:  Nl gait/ ext warm without deformities, calf tenderness, cyanosis or clubbing No obvious joint restrictions   SKIN: warm and dry without lesions    NEURO:  alert, approp, nl sensorium with  no motor or cerebellar deficits apparent.      I personally reviewed images and agree with radiology impression as follows:  CXR:   11/26/16 Stable examination:  Negative chest.   Labs ordered 12/25/2016  Allergy profile        Assessment:

## 2016-12-26 ENCOUNTER — Telehealth: Payer: Self-pay | Admitting: Internal Medicine

## 2016-12-26 LAB — RESPIRATORY ALLERGY PROFILE REGION II ~~LOC~~
Allergen, A. alternata, m6: <0.1 kU/L
Allergen, C. Herbarum, M2: <0.1 kU/L
Allergen, Comm Silver Birch, t9: <0.1 kU/L
Allergen, Cottonwood, t14: <0.1 kU/L
Allergen, Mulberry, t76: <0.1 kU/L
Allergen, P. notatum, m1: <0.1 kU/L
Aspergillus fumigatus, m3: <0.1 kU/L
BOX ELDER: 0.13 kU/L — AB
Bermuda Grass: <0.1 kU/L
Cockroach: <0.1 kU/L
Common Ragweed: <0.1 kU/L
IGE (IMMUNOGLOBULIN E), SERUM: 36 kU/L (ref <115)
Pecan/Hickory Tree IgE: <0.1 kU/L
Rough Pigweed  IgE: <0.1 kU/L
Sheep Sorrel IgE: <0.1 kU/L
Timothy Grass: <0.1 kU/L

## 2016-12-26 NOTE — Telephone Encounter (Signed)
Just needs to activate the plan for "cough" because the sensation she's having typically responds to the same measure

## 2016-12-26 NOTE — Telephone Encounter (Signed)
Pt is aware of MW's recommendations and voiced her understanding. Nothing further needed.  

## 2016-12-26 NOTE — Progress Notes (Signed)
ATC and the call will not go through, [facility]

## 2016-12-26 NOTE — Telephone Encounter (Signed)
Spoke with pt, who states she feels that mucus is collecting in her throat causing her throat to beinging to close. Pt states she does not believe this is an emergency as of now, but she does feels the discomfort.  Pt states MW instructed her during her OV yesterday to contact our office if this occurs again.  MW please advise. Thanks.   12/25/16 The next time you start having coughing spells or increased need to clear your throat for any reason I strongly rec you try:  1) mucinex dm 1200 mg every 12 hours  2)   Try prilosec otc 20mg   Take 30-60 min before first meal of the day and Pepcid ac (famotidine) 20 mg one @  bedtime until cough is completely gone for at least a week without the need for cough suppression  GERD (REFLUX)  is an extremely common cause of respiratory symptoms just like yours , many times with no obvious heartburn at all.    It can be treated with medication, but also with lifestyle changes including elevation of the head of your bed (ideally with 6 inch  bed blocks),  Smoking cessation, avoidance of late meals, excessive alcohol, and avoid fatty foods, chocolate, peppermint, colas, red wine, and acidic juices such as orange juice.  NO MINT OR MENTHOL PRODUCTS SO NO COUGH DROPS   USE SUGARLESS CANDY INSTEAD (Jolley ranchers or Stover's or Life Savers) or even ice chips will also do - the key is to swallow to prevent all throat clearing. NO OIL BASED VITAMINS - use powdered substitutes.   Only use your albuterol as a rescue medication to be used if you can't catch your breath by resting or doing a relaxed purse lip breathing pattern.  - The less you use it, the better it will work when you need it. - Ok to use up to 2 puffs  every 4 hours if you must but call for immediate appointment if use goes up over your usual need - Don't leave home without it !!  (think of it like the spare tire for your car)    Please remember to go to the lab  department downstairs in the  basement  for your tests - we will call you with the results when they are available.     Follow up here as needed for flare that doesn't respond to the above measures

## 2016-12-31 NOTE — Progress Notes (Signed)
Spoke with pt and notified of results per Dr. Wert. Pt verbalized understanding and denied any questions. 

## 2017-01-12 ENCOUNTER — Ambulatory Visit (INDEPENDENT_AMBULATORY_CARE_PROVIDER_SITE_OTHER): Payer: PPO | Admitting: Family Medicine

## 2017-01-12 ENCOUNTER — Encounter: Payer: Self-pay | Admitting: Family Medicine

## 2017-01-12 VITALS — BP 139/74 | HR 70 | Temp 98.5°F | Resp 17 | Ht 65.0 in | Wt 151.5 lb

## 2017-01-12 DIAGNOSIS — Z1212 Encounter for screening for malignant neoplasm of rectum: Secondary | ICD-10-CM

## 2017-01-12 DIAGNOSIS — Z780 Asymptomatic menopausal state: Secondary | ICD-10-CM | POA: Diagnosis not present

## 2017-01-12 DIAGNOSIS — Z0000 Encounter for general adult medical examination without abnormal findings: Secondary | ICD-10-CM | POA: Diagnosis not present

## 2017-01-12 DIAGNOSIS — E782 Mixed hyperlipidemia: Secondary | ICD-10-CM | POA: Diagnosis not present

## 2017-01-12 DIAGNOSIS — Y9302 Activity, running: Secondary | ICD-10-CM | POA: Diagnosis not present

## 2017-01-12 DIAGNOSIS — Z136 Encounter for screening for cardiovascular disorders: Secondary | ICD-10-CM | POA: Diagnosis not present

## 2017-01-12 DIAGNOSIS — G4700 Insomnia, unspecified: Secondary | ICD-10-CM

## 2017-01-12 DIAGNOSIS — J45991 Cough variant asthma: Secondary | ICD-10-CM | POA: Diagnosis not present

## 2017-01-12 DIAGNOSIS — Z5181 Encounter for therapeutic drug level monitoring: Secondary | ICD-10-CM | POA: Diagnosis not present

## 2017-01-12 DIAGNOSIS — M810 Age-related osteoporosis without current pathological fracture: Secondary | ICD-10-CM | POA: Diagnosis not present

## 2017-01-12 DIAGNOSIS — Z1383 Encounter for screening for respiratory disorder NEC: Secondary | ICD-10-CM | POA: Diagnosis not present

## 2017-01-12 DIAGNOSIS — E559 Vitamin D deficiency, unspecified: Secondary | ICD-10-CM | POA: Diagnosis not present

## 2017-01-12 DIAGNOSIS — Z1211 Encounter for screening for malignant neoplasm of colon: Secondary | ICD-10-CM | POA: Diagnosis not present

## 2017-01-12 DIAGNOSIS — Z1389 Encounter for screening for other disorder: Secondary | ICD-10-CM

## 2017-01-12 LAB — POCT URINALYSIS DIP (MANUAL ENTRY)
BILIRUBIN UA: NEGATIVE
Blood, UA: NEGATIVE
Glucose, UA: NEGATIVE mg/dL
Ketones, POC UA: NEGATIVE mg/dL
NITRITE UA: NEGATIVE
PH UA: 5.5 (ref 5.0–8.0)
PROTEIN UA: NEGATIVE mg/dL
Spec Grav, UA: 1.02 (ref 1.010–1.025)
Urobilinogen, UA: 0.2 E.U./dL

## 2017-01-12 NOTE — Progress Notes (Signed)
By signing my name below, I, Mesha Guinyard, attest that this documentation has been prepared under the direction and in the presence of Delman Cheadle, MD.  Electronically Signed: Verlee Monte, Medical Scribe. 01/12/17. 9:43 AM.  Subjective:    Patient ID: Nicole Delgado, female    DOB: 02-Sep-1950, 66 y.o.   MRN: [id number]  HPI Chief Complaint  Patient presents with  . Annual Exam    CPE    HPI Comments: Nicole Delgado is a 66 y.o. female who presents to Primary Care at [facility] for her annual exam.  Primary preventatie care: Cancer Screening: Mammogram: Normal 12/04/2016 at [facility]. No paps needed due to age and s/p hysterectomy in 1995 for benign indications of fibroids - still has ovaries. Pelvic last done in 11/28/16 by Dr. Paula Compton. CRS: colonoscopy done 10/2012 by Dr. Deatra Ina with benign polyps. Repeat in 5 years  Bone scan: Initial was just done 09/2014 which showed osteoporosis with T score of -3.4 at [facility]. Has not been repeated yet. Saw Dr. Dwyane Dee for this 1 year prior. Has order to schedule next He advised her to start IV reclast as she should avoid oral medications. Pt is about to run the [facility], but to be qualified she's runing 2 other marathons in [place] and [place]. She's running up to 9 miles around 6:30 am. She notes a bubble that pops up after she runs and it occasionally turn purple. Denies chest pain, palpitations while running. Pt has fell 2x in the past month. Pt's fingers no longer curl and she no longer has hand pain. Pt drinks almond milk, and suspects there is added calcium. Pt has been a vegetarian for 25 years.  Fall Risk  01/12/2017 01/12/2017 11/27/2016 11/26/2016 11/21/2016  Falls in the past year? Yes No No No No  Number falls in past yr: 2 or more - - - -  Injury with Fall? No - - - -   Chest Pain: After frequent coughing earlier this year she's felt left midline chest pain - coughing has resolved. She now feels chest pain when she's sleeping. Denies  feeling it when she lifts heavy objects, heartburn, chest pain when she takes a deep breath, and SOB.   Immunizations: prevnar 13 done 08/2014 Immunization History  Administered Date(s) Administered  . Pneumococcal Conjugate-13 09/03/2014  . Pneumococcal Polysaccharide-23 12/01/2015   Neg Hep C, hgba1c 5.7 2016, nl B12 2016, nl TSH annually. [place] November, 2014 showed 2 liver lesions, suspected benign adenomas.   Vision:  Visual Acuity Screening   Right eye Left eye Both eyes  Without correction:     With correction: 20/40 20/40 20/50    Vitamin D def: Pt is taking Vit. D 800 units BID, but no calcium suplements.   Insomnia: on trazodone prn  Depression Screening: Depression screen [facility] 2/9 01/12/2017 11/27/2016 11/26/2016 11/21/2016 12/01/2015  Decreased Interest 0 0 0 0 0  Down, Depressed, Hopeless 0 0 0 - 0  PHQ - 2 Score 0 0 0 0 0   Patient Active Problem List   Diagnosis Date Noted  . Cough variant asthma vs UACS  12/25/2016  . Osteoporosis 11/06/2014  . Vitamin D deficiency 10/17/2014  . Hepatic adenoma 04/20/2013  . Chronic right shoulder pain 04/20/2013  . Insomnia 04/20/2013   Past Medical History:  Diagnosis Date  . Allergy   . Asthma   . Keratoconus    followed by optho at [facility], on retinal transplant list, legally blind   Past  Surgical History:  Procedure Laterality Date  . ABDOMINAL HYSTERECTOMY  1995  . BREAST EXCISIONAL BIOPSY Bilateral    [id number], 1973, 1990 benign  . BREAST SURGERY     breast biopies x4  . rotator cuff surgery     Allergies  Allergen Reactions  . Bee Venom Swelling  . Penicillins Hives  . Valium [Diazepam] Other (See Comments)    Hallucinations   Prior to Admission medications   Medication Sig Start Date End Date Taking? Authorizing Provider  albuterol (PROVENTIL HFA;VENTOLIN HFA) 108 (90 Base) MCG/ACT inhaler Inhale 2 puffs into the lungs every 6 (six) hours as needed for wheezing or shortness of breath. 11/21/16  Yes Alveda Reasons, MD  cholecalciferol (VITAMIN D) 1000 units tablet Take 1,000 Units by mouth daily.   Yes [provider]  GLUCOSAMINE-CHONDROITIN PO Take 1 tablet by mouth 2 (two) times daily with a meal.   Yes [provider]  Multiple Vitamin (MULTIVITAMIN) capsule Take 1 capsule by mouth daily.   Yes [provider]   Social History   Social History  . Marital status: Divorced    Spouse name: N/A  . Number of children: N/A  . Years of education: N/A   Occupational History  . Realtor    Social History Main Topics  . Smoking status: Never Smoker  . Smokeless tobacco: Never Used  . Alcohol use No  . Drug use: No  . Sexual activity: Yes    Birth control/ protection: Surgical   Other Topics Concern  . Not on file   Social History Narrative  . No narrative on file   Family History  Problem Relation Age of Onset  . Alzheimer's disease Mother   . Heart Problems Father   . Heart disease Father   . Cancer Brother   . Hypertension Sister   . Colon cancer Neg Hx     Depression screen [facility] 2/9 01/12/2017 11/27/2016 11/26/2016 11/21/2016 12/01/2015  Decreased Interest 0 0 0 0 0  Down, Depressed, Hopeless 0 0 0 - 0  PHQ - 2 Score 0 0 0 0 0    Review of Systems  Respiratory: Negative for shortness of breath.   Cardiovascular: Positive for chest pain. Negative for palpitations.  Musculoskeletal: Negative for arthralgias.  All other systems reviewed and are negative.  Objective:  Physical Exam  Constitutional: She appears well-developed and well-nourished. No distress.  HENT:  Head: Normocephalic and atraumatic.  Fluid behind the ears with retraction and congestion Pale boggy  Oropharynx nl  Eyes: Conjunctivae are normal.  Neck: Neck supple. No thyromegaly present.  Cardiovascular: Normal rate, regular rhythm and normal heart sounds.  Exam reveals no gallop and no friction rub.   No murmur heard. Pulses:      Dorsalis pedis pulses are 2+ on the right  side, and 2+ on the left side.  Pulmonary/Chest: Effort normal and breath sounds normal. No respiratory distress. She has no wheezes. She has no rhonchi. She has no rales.  Abdominal: Soft. Bowel sounds are normal. She exhibits no distension. There is no tenderness.  Musculoskeletal: She exhibits no edema (lower extremity).  Neurological: She is alert.  Skin: Skin is warm and dry.  Psychiatric: She has a normal mood and affect. Her behavior is normal.  Nursing note and vitals reviewed.   Vitals:   01/12/17 0909  BP: 139/74  Pulse: 70  Resp: 17  Temp: 98.5 F (36.9 C)  TempSrc: Oral  SpO2: 98%  Weight: 151  lb 8 oz (68.7 kg)  Height: 5\' 5"  (1.651 m)  Body mass index is 25.21 kg/m.   [EKG Reading]: Sinus  Rhythm. No acute ischemic changes. -consider old anterior infarct.   Assessment & Plan:   1. Annual physical exam   2. Screening for cardiovascular, respiratory, and genitourinary diseases   3. Screening for colorectal cancer   4. Postmenopausal estrogen deficiency   5. Age-related osteoporosis without current pathological fracture - needs to update dexa and f/u with endocrine Dr. Dwyane Dee to start reclast asap - pt agree  6. Insomnia, unspecified type   7. Vitamin D deficiency - increase otc vit D supp from 1600iu to 4000-5000iu qd  8. Cough variant asthma vs UACS    9. Mixed hyperlipidemia - rec pt consider starting a chol med  10. Exercise involving running - marathon runner - trying to qualify for the [place] marathon  11. Medication monitoring encounter     Orders Placed This Encounter  Procedures  . DG Bone Density    Standing Status:   Future    Standing Expiration Date:   03/14/2018    Order Specific Question:   Reason for Exam (SYMPTOM  OR DIAGNOSIS REQUIRED)    Answer:   postmenopausal estrogen deficiency; osteoporosis    Order Specific Question:   Preferred imaging location?    Answer:   [facility]  . Comprehensive metabolic panel    Order Specific  Question:   Has the patient fasted?    Answer:   Yes  . Lipid panel    Order Specific Question:   Has the patient fasted?    Answer:   Yes  . VITAMIN D 25 Hydroxy (Vit-D Deficiency, Fractures)  . Magnesium  . Phosphorus  . POCT urinalysis dipstick  . EKG 12-Lead    I personally performed the services described in this documentation, which was scribed in my presence. The recorded information has been reviewed and considered, and addended by me as needed.   Delman Cheadle, M.D.  Primary Care at [facility] [address] [place], [place] [address] [phone] phone [phone] fax  01/14/17 10:41 PM

## 2017-01-12 NOTE — Patient Instructions (Addendum)
Make sure you are taking a daily calcium/vitamin D supplement - and twice a day would be great. Try to find a chewable calcium supplement with 400 to 600 mg of calcium in it and as much vitamin D as you can.  Take this at least once a day, and not with other calcium sources for maximum absorption.   You need to schedule your bone scan.  Please call the [facility] at [phone] to schedule.  Call: Elayne Snare, MD  Department:  [facility]  Dept Phone:  [phone]  To schedule appointment for Reclast. It is possible that they will ask you to come back in for an appointment with Dr. Dwyane Dee prior to the reclast infusion in order to ensure that you understand the risks/benefits as well as ensure that they have completed the necessary forms for insurance approval of the infusion but I rec doing this asap so your lab work and bone scan are UTD and you don't need to repeat these (but have the bone scan done prior the appt - but NOT prior to [facility] the appointment as Dr. Dwyane Dee is often booked up for a month or two in advance).    IF you received an x-ray today, you will receive an invoice from [facility]. Please contact [facility] at [phone] with questions or concerns regarding your invoice.   IF you received labwork today, you will receive an invoice from [facility]. Please contact [facility] at [phone] with questions or concerns regarding your invoice.   Our billing staff will not be able to assist you with questions regarding bills from these companies.  You will be contacted with the lab results as soon as they are available. The fastest way to get your results is to activate your My Chart account. Instructions are located on the last page of this paperwork. If you have not heard from [place] regarding the results in 2 weeks, please contact this office.      Calcium Intake Recommendations Calcium is a mineral that affects many functions  in the body, including:  Blood clotting.  Blood vessel function.  Nerve impulse conduction.  Hormone secretion.  Muscle contraction.  Bone and teeth functions.  Most of your body's calcium supply is stored in your bones and teeth. When your calcium stores are low, you may be at risk for low bone mass, bone loss, and bone fractures. Consuming enough calcium helps to grow healthy bones and teeth and to prevent breakdown over time. It is very important that you get enough calcium if you are:  A child undergoing rapid growth.  An adolescent girl.  A pre- or post-menopausal woman.  A woman whose menstrual cycle has stopped due to anorexia nervosa or regular intense exercise.  An individual with lactose intolerance or a milk allergy.  A vegetarian.  What is my plan? Try to consume the recommended amount of calcium daily based on your age. Depending on your overall health, your health care provider may recommend increased calcium intake.General daily calcium intake recommendations by age are:  Birth to 6 months: 200 mg.  Infants 7 to 12 months: 260 mg.  Children 1 to 3 years: 700 mg.  Children 4 to 8 years: 1,000 mg.  Children 9 to 13 years: 1,300 mg.  Teens 14 to 18 years: 1,300 mg.  Adults 19 to 50 years: 1,000 mg.  Adult women 51 to 70 years: 1,200 mg.  Adult men 51 to 70 years: 1,000 mg.  Adults 71  years and older: 1,200 mg.  Pregnant and breastfeeding teens: 1,300 mg.  Pregnant and breastfeeding adults: 1,000 mg.  What do I need to know about calcium intake?  In order for the body to absorb calcium, it needs vitamin D. You can get vitamin D through: ? Direct exposure of the skin to sunlight. ? Foods, such as egg yolks, liver, saltwater fish, and fortified milk. ? Supplements.  Consuming too much calcium may cause: ? Constipation. ? Decreased absorption of iron and zinc. ? Kidney stones.  Calcium supplements may interact with certain medicines. Check  with your health care provider before starting any calcium supplements.  Try to get most of your calcium from food. What foods can I eat? Grains  Fortified oatmeal. Fortified ready-to-eat cereals. Fortified frozen waffles. Vegetables Turnip greens. Broccoli. Fruits Fortified orange juice. Meats and Other Protein Sources Canned sardines with bones. Canned salmon with bones. Soy beans. Tofu. Baked beans. Almonds. [place] nuts. Sunflower seeds. Dairy Milk. Yogurt. Cheese. Cottage cheese. Beverages Fortified soy milk. Fortified rice milk. Sweets/Desserts Pudding. Ice Cream. Milkshakes. Blackstrap molasses. The items listed above may not be a complete list of recommended foods or beverages. Contact your dietitian for more options. What foods can affect my calcium intake? It may be more difficult for your body to use calcium or calcium may leave your body more quickly if you consume large amounts of:  Sodium.  Protein.  Caffeine.  Alcohol.  This information is not intended to replace advice given to you by your health care provider. Make sure you discuss any questions you have with your health care provider. Document Released: 01/10/2004 Document Revised: 12/16/2015 Document Reviewed: 11/03/2013 Elsevier Interactive Patient Education  2018 [place] protect organs, store calcium, and anchor muscles. Good health habits, such as eating nutritious foods and exercising regularly, are important for maintaining healthy bones. They can also help to prevent a condition that causes bones to lose density and become weak and brittle (osteoporosis). Why is bone mass important? Bone mass refers to the amount of bone tissue that you have. The higher your bone mass, the stronger your bones. An important step toward having healthy bones throughout life is to have strong and dense bones during childhood. A young adult who has a high bone mass is more likely to have a high bone  mass later in life. Bone mass at its greatest it is called peak bone mass. A large decline in bone mass occurs in older adults. In women, it occurs about the time of menopause. During this time, it is important to practice good health habits, because if more bone is lost than what is replaced, the bones will become less healthy and more likely to break (fracture). If you find that you have a low bone mass, you may be able to prevent osteoporosis or further bone loss by changing your diet and lifestyle. How can I find out if my bone mass is low? Bone mass can be measured with an X-ray test that is called a bone mineral density (BMD) test. This test is recommended for all women who are age 33 or older. It may also be recommended for men who are age 47 or older, or for people who are more likely to develop osteoporosis due to:  Having bones that break easily.  Having a long-term disease that weakens bones, such as kidney disease or rheumatoid arthritis.  Having menopause earlier than normal.  Taking medicine that weakens bones, such as steroids,  thyroid hormones, or hormone treatment for breast cancer or prostate cancer.  Smoking.  Drinking three or more alcoholic drinks each day.  What are the nutritional recommendations for healthy bones? To have healthy bones, you need to get enough of the right minerals and vitamins. Most nutrition experts recommend getting these nutrients from the foods that you eat. Nutritional recommendations vary from person to person. Ask your health care provider what is healthy for you. Here are some general guidelines. Calcium Recommendations Calcium is the most important (essential) mineral for bone health. Most people can get enough calcium from their diet, but supplements may be recommended for people who are at risk for osteoporosis. Good sources of calcium include:  Dairy products, such as low-fat or nonfat milk, cheese, and yogurt.  Dark green leafy vegetables,  such as bok choy and broccoli.  Calcium-fortified foods, such as orange juice, cereal, bread, soy beverages, and tofu products.  Nuts, such as almonds.  Follow these recommended amounts for daily calcium intake:  Children, age 56?3: 700 mg.  Children, age 77?8: 1,000 mg.  Children, age 88?13: 1,300 mg.  Teens, age 561?18: 1,300 mg.  Adults, age 64?50: 1,000 mg.  Adults, age 775?70: ? Men: 1,000 mg. ? Women: 1,200 mg.  Adults, age 566 or older: 1,200 mg.  Pregnant and breastfeeding females: ? Teens: 1,300 mg. ? Adults: 1,000 mg.  Vitamin D Recommendations Vitamin D is the most essential vitamin for bone health. It helps the body to absorb calcium. Sunlight stimulates the skin to make vitamin D, so be sure to get enough sunlight. If you live in a cold climate or you do not get outside often, your health care provider may recommend that you take vitamin D supplements. Good sources of vitamin D in your diet include:  Egg yolks.  Saltwater fish.  Milk and cereal fortified with vitamin D.  Follow these recommended amounts for daily vitamin D intake:  Children and teens, age 65?18: 97 international units.  Adults, age 32 or younger: 400-800 international units.  Adults, age 31 or older: 800-1,000 international units.  Other Nutrients Other nutrients for bone health include:  Phosphorus. This mineral is found in meat, poultry, dairy foods, nuts, and legumes. The recommended daily intake for adult men and adult women is 700 mg.  Magnesium. This mineral is found in seeds, nuts, dark green vegetables, and legumes. The recommended daily intake for adult men is 400?420 mg. For adult women, it is 310?320 mg.  Vitamin K. This vitamin is found in green leafy vegetables. The recommended daily intake is 120 mg for adult men and 90 mg for adult women.  What type of physical activity is best for building and maintaining healthy bones? Weight-bearing and strength-building activities are  important for building and maintaining peak bone mass. Weight-bearing activities cause muscles and bones to work against gravity. Strength-building activities increases muscle strength that supports bones. Weight-bearing and muscle-building activities include:  Walking and hiking.  Jogging and running.  Dancing.  Gym exercises.  Lifting weights.  Tennis and racquetball.  Climbing stairs.  Aerobics.  Adults should get at least 30 minutes of moderate physical activity on most days. Children should get at least 60 minutes of moderate physical activity on most days. Ask your health care provide what type of exercise is best for you. Where can I find more information? For more information, check out the following websites:  [place]: YardHomes.se  [employer] of Health: http://www.niams.AnonymousEar.fr.asp  This information is not intended  to replace advice given to you by your health care provider. Make sure you discuss any questions you have with your health care provider. Document Released: 08/18/2003 Document Revised: 12/16/2015 Document Reviewed: 06/02/2014 Elsevier Interactive Patient Education  [employer].

## 2017-01-13 LAB — PHOSPHORUS: PHOSPHORUS: 3.3 mg/dL (ref 2.5–4.5)

## 2017-01-13 LAB — MAGNESIUM: Magnesium: 2 mg/dL (ref 1.6–2.3)

## 2017-01-14 ENCOUNTER — Encounter: Payer: Self-pay | Admitting: Endocrinology

## 2017-01-14 ENCOUNTER — Ambulatory Visit (INDEPENDENT_AMBULATORY_CARE_PROVIDER_SITE_OTHER): Payer: PPO | Admitting: Endocrinology

## 2017-01-14 VITALS — BP 134/82 | HR 71 | Ht 66.0 in | Wt 155.6 lb

## 2017-01-14 DIAGNOSIS — M818 Other osteoporosis without current pathological fracture: Secondary | ICD-10-CM

## 2017-01-14 LAB — LIPID PANEL
CHOL/HDL RATIO: 4.1 ratio (ref 0.0–4.4)
Cholesterol, Total: 257 mg/dL — ABNORMAL HIGH (ref 100–199)
HDL: 62 mg/dL (ref >39)
LDL Calculated: 169 mg/dL — ABNORMAL HIGH (ref 0–99)
Triglycerides: 131 mg/dL (ref 0–149)
VLDL Cholesterol Cal: 26 mg/dL (ref 5–40)

## 2017-01-14 LAB — COMPREHENSIVE METABOLIC PANEL
ALT: 15 IU/L (ref 0–32)
AST: 21 IU/L (ref 0–40)
Albumin/Globulin Ratio: 1.5 (ref 1.2–2.2)
Albumin: 4 g/dL (ref 3.6–4.8)
Alkaline Phosphatase: 87 IU/L (ref 39–117)
BUN/Creatinine Ratio: 12 (ref 12–28)
BUN: 9 mg/dL (ref 8–27)
Bilirubin Total: 0.4 mg/dL (ref 0.0–1.2)
CALCIUM: 9.3 mg/dL (ref 8.7–10.3)
CO2: 26 mmol/L (ref 20–29)
Chloride: 101 mmol/L (ref 96–106)
Creatinine, Ser: 0.76 mg/dL (ref 0.57–1.00)
GFR, EST AFRICAN AMERICAN: 93 mL/min/{1.73_m2} (ref >59)
GFR, EST NON AFRICAN AMERICAN: 81 mL/min/{1.73_m2} (ref >59)
GLUCOSE: 97 mg/dL (ref 65–99)
Globulin, Total: 2.6 g/dL (ref 1.5–4.5)
POTASSIUM: 4.1 mmol/L (ref 3.5–5.2)
SODIUM: 142 mmol/L (ref 134–144)
Total Protein: 6.6 g/dL (ref 6.0–8.5)

## 2017-01-14 LAB — VITAMIN D 25 HYDROXY (VIT D DEFICIENCY, FRACTURES): VIT D 25 HYDROXY: 24.1 ng/mL — AB (ref 30.0–100.0)

## 2017-01-14 NOTE — Patient Instructions (Signed)
Vitamin D 3. Take 2000 units daily
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
## 2017-01-15 ENCOUNTER — Other Ambulatory Visit: Payer: Self-pay | Admitting: Family Medicine

## 2017-01-15 DIAGNOSIS — E2839 Other primary ovarian failure: Secondary | ICD-10-CM

## 2017-01-22 ENCOUNTER — Ambulatory Visit
Admission: RE | Admit: 2017-01-22 | Discharge: 2017-01-22 | Disposition: A | Discharge status: Home / Self Care | Payer: PPO | Source: Ambulatory Visit | Attending: Family Medicine | Admitting: Family Medicine

## 2017-01-22 ENCOUNTER — Telehealth: Payer: Self-pay | Admitting: Family Medicine

## 2017-01-22 DIAGNOSIS — M810 Age-related osteoporosis without current pathological fracture: Secondary | ICD-10-CM | POA: Diagnosis not present

## 2017-01-22 DIAGNOSIS — Z5181 Encounter for therapeutic drug level monitoring: Secondary | ICD-10-CM

## 2017-01-22 DIAGNOSIS — Z780 Asymptomatic menopausal state: Secondary | ICD-10-CM | POA: Diagnosis not present

## 2017-01-22 DIAGNOSIS — E2839 Other primary ovarian failure: Secondary | ICD-10-CM

## 2017-01-22 NOTE — Telephone Encounter (Signed)
Pt is wanting to let Dr. Brigitte Pulse know that she has reviewed her lab letter she received and wanting to go ahead with the Statin medication and would like for [place] to Call it in to the [place] number [phone]

## 2017-01-25 NOTE — Telephone Encounter (Signed)
Please advise 

## 2017-01-28 ENCOUNTER — Encounter: Payer: Self-pay | Admitting: Physician Assistant

## 2017-01-28 ENCOUNTER — Ambulatory Visit (INDEPENDENT_AMBULATORY_CARE_PROVIDER_SITE_OTHER): Payer: PPO | Admitting: Physician Assistant

## 2017-01-28 VITALS — BP 130/76 | HR 74 | Temp 98.2°F | Resp 16 | Ht 66.0 in | Wt 156.0 lb

## 2017-01-28 DIAGNOSIS — Z91030 Bee allergy status: Secondary | ICD-10-CM

## 2017-01-28 DIAGNOSIS — H938X1 Other specified disorders of right ear: Secondary | ICD-10-CM | POA: Diagnosis not present

## 2017-01-28 DIAGNOSIS — T63444A Toxic effect of venom of bees, undetermined, initial encounter: Secondary | ICD-10-CM

## 2017-01-28 DIAGNOSIS — E7800 Pure hypercholesterolemia, unspecified: Secondary | ICD-10-CM

## 2017-01-28 MED ORDER — ATORVASTATIN CALCIUM 20 MG PO TABS: 20.0000 mg | ORAL_TABLET | Freq: Every day | ORAL | 1 refills | Status: DC

## 2017-01-28 MED ORDER — METHYLPREDNISOLONE ACETATE 80 MG/ML IJ SUSP
80.0000 mg | Freq: Once | INTRAMUSCULAR | Status: AC
  Administered 2017-01-28: 80 mg via INTRAMUSCULAR

## 2017-01-28 MED ORDER — RANITIDINE HCL 150 MG PO TABS: 150.0000 mg | ORAL_TABLET | Freq: Two times a day (BID) | ORAL | 0 refills | Status: DC

## 2017-01-28 MED ORDER — PREDNISONE 20 MG PO TABS: ORAL_TABLET | ORAL | 0 refills | Status: DC

## 2017-01-28 MED ORDER — EPINEPHRINE 0.3 MG/0.3ML IJ SOAJ: 0.3000 mg | Freq: Once | INTRAMUSCULAR | 0 refills | Status: AC

## 2017-01-28 NOTE — Progress Notes (Signed)
Nicole Delgado  MRN: [id number] DOB: [birth year]  PCP: Shawnee Knapp, MD  Subjective:  Pt is a 66 year old female who presents to clinic for bee sting. She was stung on her right ear about 3 hours ago. C/o ear swelling and stiffness down the right side of her neck. She put Benadryl cream on the wound.  She is allergic to bee venom. She has an epi pen, however it is expired. She did not use it.   Denies n/v, abdominal pain, swelling of her mouth or tongue, wheezing, shob, fever, chills.   She would like a cholesterol medication. She was here for her annual exam with Dr. Brigitte Pulse a few weeks ago. She received a phone call that her cholesterol levels were high and she needs to start medication. No one has returned her phone calls.   Review of Systems  Constitutional: Negative for chills, diaphoresis, fatigue and fever.  HENT: Positive for ear pain (right ). Negative for trouble swallowing and voice change.   Respiratory: Negative for apnea, cough, chest tightness, shortness of breath and wheezing.   Gastrointestinal: Negative for abdominal pain, nausea and vomiting.  Musculoskeletal: Positive for neck stiffness (right side).  Skin: Positive for wound.  Neurological: Negative for dizziness, numbness and headaches.  Psychiatric/Behavioral: Negative for confusion. The patient is not nervous/anxious.     Patient Active Problem List   Diagnosis Date Noted  . Cough variant asthma vs UACS  12/25/2016  . Osteoporosis 11/06/2014  . Vitamin D deficiency 10/17/2014  . Hepatic adenoma 04/20/2013  . Chronic right shoulder pain 04/20/2013  . Insomnia 04/20/2013    Current Outpatient Prescriptions on File Prior to Visit  Medication Sig Dispense Refill  . albuterol (PROVENTIL HFA;VENTOLIN HFA) 108 (90 Base) MCG/ACT inhaler Inhale 2 puffs into the lungs every 6 (six) hours as needed for wheezing or shortness of breath. 1 Inhaler 0  . calcium carbonate (CALCIUM 600) 600 MG TABS tablet Take 600 mg by mouth  2 (two) times daily with a meal.    . cholecalciferol (VITAMIN D) 1000 units tablet Take 1,000 Units by mouth daily.    Marland Kitchen GLUCOSAMINE-CHONDROITIN PO Take 1 tablet by mouth 2 (two) times daily with a meal.    . Multiple Vitamin (MULTIVITAMIN) capsule Take 1 capsule by mouth daily.     No current facility-administered medications on file prior to visit.     Allergies  Allergen Reactions  . Bee Venom Swelling  . Penicillins Hives  . Valium [Diazepam] Other (See Comments)    Hallucinations     Objective:  BP 130/76   Pulse 74   Temp 98.2 F (36.8 C) (Oral)   Resp 16   Ht 5\' 6"  (1.676 m)   Wt 156 lb (70.8 kg) Comment: per pt  SpO2 97%   BMI 25.18 kg/m   Physical Exam  Constitutional: She is oriented to person, place, and time and well-developed, well-nourished, and in no distress. No distress.  HENT:  Right Ear: There is swelling.  Neck: Normal range of motion. Neck supple. No neck rigidity. No edema and no erythema present.  Cardiovascular: Normal rate, regular rhythm and normal heart sounds.   Pulmonary/Chest: Breath sounds normal. No accessory muscle usage or stridor. No respiratory distress.  Neurological: She is alert and oriented to person, place, and time. GCS score is 15.  Skin: Skin is warm and dry.  Psychiatric: Mood, memory, affect and judgment normal.  Vitals reviewed.   Assessment and Plan :  1. Bee sting, undetermined intent, initial encounter 2. Allergy to honey bee venom 3. Swelling of right ear - Ambulatory referral to Allergy - EPINEPHrine 0.3 mg/0.3 mL IJ SOAJ injection; Inject 0.3 mLs (0.3 mg total) into the muscle once.  Dispense: 1 Device; Refill: 0 - ranitidine (ZANTAC) 150 MG tablet; Take 1 tablet (150 mg total) by mouth 2 (two) times daily.  Dispense: 60 tablet; Refill: 0 - methylPREDNISolone acetate (DEPO-MEDROL) injection 80 mg; Inject 1 mL (80 mg total) into the muscle once. - predniSONE (DELTASONE) 20 MG tablet; Take 3 PO QAM x3days, 2 PO QAM  x3days, 1 PO QAM x3days  Dispense: 18 tablet; Refill: 0 - Benadryl 50mg  q 4-6 hrs x 2 days. Then Zantac AND zyrtec x 5 days. Will refill EpiPen. Go to the emergency dept if symptoms worsen. She agrees with plan.  4. Elevated cholesterol - atorvastatin (LIPITOR) 20 MG tablet; Take 1 tablet (20 mg total) by mouth daily.  Dispense: 60 tablet; Refill: 1 - Start taking Lipitor qd. F/u with Dr. Brigitte Pulse in 4-6 weeks for fasting blood work. She agrees with plan.    Mercer Pod, PA-C  [facility] 01/28/2017 10:16 AM

## 2017-01-28 NOTE — Telephone Encounter (Signed)
Pt was seen by McVey today for a bee sting and started on atorvastatin 20 mg qd #60 w/ 1 refill. She was instructed to follow-up with me in 4-6 weeks for fasting labs.  Called patient review bone density results. Patient may RTC in 4-6 weeks for lab only visit. ALT entered. She does not need to be fasting. Recheck in 4-6 months with me in office visit with fasting labs.

## 2017-01-28 NOTE — Patient Instructions (Addendum)
Take Benadryl 24m every 4-6 hrs x 2 days. Then take Zantac AND zyrtec as prescribed x 5 days. Come back if you are not improving.  If your symptoms worsen and you are having rash, abdominal pain, nausea or difficulty breathing, go to the emergency department.  You will receive a phone call soon to schedule an appointment with allergy.   Start taking 212mAtorvastatin daily. Schedule an appointment with Dr. ShBrigitte Pulsen 4-6 weeks for cholesterol recheck. Please be fasting at this appointment.   Thank you for coming in today. I hope you feel we met your needs.  Feel free to call PCP if you have any questions or further requests.  Please consider signing up for MyChart if you do not already have it, as this is a great way to communicate with me.  Best,  Whitney McVey, PA-C  IF you received an x-ray today, you will receive an [facility]. Please contact [facility] at [phone] questions or concerns regarding your invoice.   IF you received labwork today, you will receive an invoice from [place] [facility] at [phone] questions or concerns regarding your invoice.   Our billing staff will not be able to assist you with questions regarding bills from these companies.  You will be contacted with the lab results as soon as they are available. The fastest way to get your results is to activate your My Chart account. Instructions are located on the last page of this paperwork. If you have not heard from [place] the results in 2 weeks, please contact this office.

## 2017-01-29 ENCOUNTER — Telehealth: Payer: Self-pay | Admitting: Nutrition

## 2017-01-29 DIAGNOSIS — H18603 Keratoconus, unspecified, bilateral: Secondary | ICD-10-CM | POA: Diagnosis not present

## 2017-01-29 DIAGNOSIS — H31093 Other chorioretinal scars, bilateral: Secondary | ICD-10-CM | POA: Diagnosis not present

## 2017-01-29 DIAGNOSIS — H2513 Age-related nuclear cataract, bilateral: Secondary | ICD-10-CM | POA: Diagnosis not present

## 2017-01-29 NOTE — Telephone Encounter (Signed)
Phoned her this AM, and she said that her family doctor called her last night and said there was an error in her bone density results, and that he wants to see her to discuss her options.???Marland Kitchen  She was given my number to call me back after she talks with him.  She did not want any information on Reclast at this time.

## 2017-01-29 NOTE — Telephone Encounter (Signed)
Opened in error

## 2017-02-01 ENCOUNTER — Telehealth: Payer: Self-pay | Admitting: Family Medicine

## 2017-02-05 NOTE — Telephone Encounter (Signed)
See 1/15PM note

## 2017-02-05 NOTE — Telephone Encounter (Signed)
Pt is just wanting to know if her insurance is going to cover the reclast entirely or if there is going to be a charge to her and if so how much. She does not have any futther questions about the bone density test or the reclast itself.     Pt states she called Dr. Ronnie Derby office this a.m. Wanting to ensure she had insurance coverage before scheduling infusion. I see the telephone note that that office took but perhaps some miscommunication over what pt was calling for -   Forwarding note to Ms. Leonia Reader at [facility] endocrine as pt's call there this a.m. Was sent to her.  Pt leaving town but will sched infusion as soon as she returns as long as Copy is confirmed.

## 2017-02-05 NOTE — Telephone Encounter (Signed)
Patient calling back about bone density test. Please call back to discuss.  Also transferred her to Ryane's line to leave a message.  Ty,  -LL

## 2017-02-05 NOTE — Telephone Encounter (Signed)
Called her to tell her that her insurance will pay 80% with no prior auth. Needed.  She wants to know what it will cost.  She is out of town until 10/1, and wanted it done when she gets back.  I told her that I was on vacation until 10/18, but told her that  she can have it done at the hospital, if she does not want to wait.  she asked if I would get cost information for this.

## 2017-03-11 DIAGNOSIS — R05 Cough: Secondary | ICD-10-CM | POA: Diagnosis not present

## 2017-03-11 DIAGNOSIS — J300 Vasomotor rhinitis: Secondary | ICD-10-CM | POA: Diagnosis not present

## 2017-03-11 DIAGNOSIS — Z880 Allergy status to penicillin: Secondary | ICD-10-CM | POA: Diagnosis not present

## 2017-03-11 DIAGNOSIS — J309 Allergic rhinitis, unspecified: Secondary | ICD-10-CM | POA: Diagnosis not present

## 2017-03-27 ENCOUNTER — Ambulatory Visit (INDEPENDENT_AMBULATORY_CARE_PROVIDER_SITE_OTHER): Payer: PPO | Admitting: Physician Assistant

## 2017-03-27 ENCOUNTER — Encounter: Payer: Self-pay | Admitting: Physician Assistant

## 2017-03-27 VITALS — BP 110/80 | HR 70 | Temp 97.6°F | Resp 16 | Ht 66.0 in | Wt 155.2 lb

## 2017-03-27 DIAGNOSIS — H18609 Keratoconus, unspecified, unspecified eye: Secondary | ICD-10-CM | POA: Insufficient documentation

## 2017-03-27 DIAGNOSIS — H579 Unspecified disorder of eye and adnexa: Secondary | ICD-10-CM

## 2017-03-27 DIAGNOSIS — R51 Headache: Secondary | ICD-10-CM | POA: Diagnosis not present

## 2017-03-27 DIAGNOSIS — R519 Headache, unspecified: Secondary | ICD-10-CM

## 2017-03-27 NOTE — Patient Instructions (Addendum)
  Next episode take Advil or Tylenol.  Try Claritin-D for the next 1-2 weeks.   Come back if this happens again.   Thank you for coming in today. I hope you feel we met your needs.  Feel free to call PCP if you have any questions or further requests.  Please consider signing up for MyChart if you do not already have it, as this is a great way to communicate with me.  Best,  Whitney McVey, PA-C   IF you received an x-ray today, you will receive an invoice from [facility]. Please contact [facility] at [phone] with questions or concerns regarding your invoice.   IF you received labwork today, you will receive an invoice from [facility]. Please contact [facility] at [phone] with questions or concerns regarding your invoice.   Our billing staff will not be able to assist you with questions regarding bills from these companies.  You will be contacted with the lab results as soon as they are available. The fastest way to get your results is to activate your My Chart account. Instructions are located on the last page of this paperwork. If you have not heard from [place] regarding the results in 2 weeks, please contact this office.

## 2017-03-27 NOTE — Progress Notes (Signed)
Nicole Delgado  MRN: [id number] DOB: 1951-01-12  PCP: Shawnee Knapp, MD  Subjective:  Pt is a 66 year old female PMH hepatic adenoma, osteoporosis, insomnia and vitamin D deficiency who presents to clinic for two separate episodes of pressure behind left eye x 1 week.  First episode was one week ago - She was driving and had to pull over due to pain. 10/10 pain "dizzing pain" episode lasted about a few minutes. "Dull" pain. Kind of like a throbbing. "like pushing my eye from the back" Sort of radiates back toward ear.  Last night another episode. 3/10 pain. Woke her up from sleep. Episode lasted at least 20 minutes. "rubbing my eyeball helped".   Denies nausea, vomiting, muscle weakness, n/t, vision changes during the episode, aura, photophobia, phonophobia, foreign body sensation, eye itching, balance problem, gait disturbance, tinnitus.   She has not taken anything to feel better.  Denies recent illness. No h/o seasonal allergies, migraines.    H/o keritaconus.Seen regularly at [facility] opthamology. Most recent appt was in August. "everything was fine" She reports a few episodes of waking up in the middle of the night to urinate and "covering my right eye to test my vision and there is no light coming in". She says she is "practically blind" without her corrective lenses, but states she can normally at least see light.   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   Review of Systems  Constitutional: Negative for chills, diaphoresis, fatigue and fever.  HENT: Negative for rhinorrhea, sinus pain and tinnitus.   Eyes: Positive for pain and visual disturbance. Negative for photophobia, discharge, redness and itching.  Cardiovascular: Negative for palpitations.  Neurological: Positive for headaches. Negative for dizziness, facial asymmetry, speech difficulty, weakness and numbness.    Patient Active Problem List   Diagnosis Date  Noted  . Cough variant asthma vs UACS  12/25/2016  . Osteoporosis 11/06/2014  . Vitamin D deficiency 10/17/2014  . Hepatic adenoma 04/20/2013  . Chronic right shoulder pain 04/20/2013  . Insomnia 04/20/2013    Current Outpatient Prescriptions on File Prior to Visit  Medication Sig Dispense Refill  . albuterol (PROVENTIL HFA;VENTOLIN HFA) 108 (90 Base) MCG/ACT inhaler Inhale 2 puffs into the lungs every 6 (six) hours as needed for wheezing or shortness of breath. 1 Inhaler 0  . atorvastatin (LIPITOR) 20 MG tablet Take 1 tablet (20 mg total) by mouth daily. 60 tablet 1  . calcium carbonate (CALCIUM 600) 600 MG TABS tablet Take 600 mg by mouth 2 (two) times daily with a meal.    . cholecalciferol (VITAMIN D) 1000 units tablet Take 1,000 Units by mouth daily.    Marland Kitchen GLUCOSAMINE-CHONDROITIN PO Take 1 tablet by mouth 2 (two) times daily with a meal.    . Multiple Vitamin (MULTIVITAMIN) capsule Take 1 capsule by mouth daily.    . ranitidine (ZANTAC) 150 MG tablet Take 1 tablet (150 mg total) by mouth 2 (two) times daily. (Patient not taking: Reported on 03/27/2017) 60 tablet 0   No current facility-administered medications on file prior to visit.     Allergies  Allergen Reactions  . Bee Venom Swelling  . Penicillins Hives  . Valium [Diazepam] Other (See Comments)    Hallucinations     Objective:  BP 110/80   Pulse 70   Temp 97.6 F (36.4 C) (Oral)   Resp 16   Ht 5\' 6"  (1.676 m)  Comment: per pt  Wt 155 lb 3.2 oz (70.4 kg)   SpO2 98%   BMI 25.05 kg/m   Visual acuity today: R 20/40, L 20/30, both 20/50. Improved since last OV.   Physical Exam  Constitutional: She is oriented to person, place, and time and well-developed, well-nourished, and in no distress. No distress.  HENT:  Right Ear: Tympanic membrane normal.  Left Ear: Tympanic membrane normal.  Eyes: Pupils are equal, round, and reactive to light. Conjunctivae and EOM are normal. Right eye exhibits no nystagmus. Left eye  exhibits no nystagmus.  Mild pain with lateral movement of left eye.  Cardiovascular: Normal rate, regular rhythm and normal heart sounds.   Neurological: She is alert and oriented to person, place, and time. She has normal motor skills and normal sensation. GCS score is 15.  Skin: Skin is warm and dry.  Psychiatric: Mood, memory, affect and judgment normal.  Vitals reviewed.   Assessment and Plan :  1. Eye pressure 2. Acute nonintractable headache, unspecified headache type - Pt c/o two episodes of unilateral pressure behind her eye. No concerning findings today on PE. Suspect possible cluster headache. Advised OTC NSAIDs. Plan for watchful waiting.  RTC if episodes continue to recur or if they worsen. Consider imaging.  3. Keratoconus, unspecified laterality - Followed at [facility].    Mercer Pod, PA-C  [facility] 03/27/2017 11:26 AM

## 2017-04-10 NOTE — Telephone Encounter (Signed)
Gave patient the price of the reclast infusion and she says she can not afford it.  She was told to call her insurance to see what it will pay.  She was also given the billing number to [facility] for the cost of having it done as an out patient in the hosptial. She wants to know that since her bone density did not get worse, does she still need this done?

## 2017-04-11 MED ORDER — ALENDRONATE SODIUM 70 MG PO TABS: 70.0000 mg | ORAL_TABLET | ORAL | 4 refills | Status: DC

## 2017-04-11 NOTE — Telephone Encounter (Signed)
If she cannot afford Reclast we will not do this She can start back on generic Fosamax which will be effective in preventing any fractures in the future, she should discuss with PCP and continue follow-up with her

## 2017-04-11 NOTE — Telephone Encounter (Signed)
Sent the fosamax into her pharmacy

## 2017-04-11 NOTE — Addendum Note (Signed)
Addended by: Shawnee Knapp on: 04/11/2017 07:31 PM   Modules accepted: Orders

## 2017-04-19 ENCOUNTER — Telehealth: Payer: Self-pay | Admitting: Family Medicine

## 2017-04-19 NOTE — Telephone Encounter (Signed)
Pt came in to see if she could get a refill for sleep medicine. Pt stated she came in 2015 for her foot injury and was prescribed tramadol for sleep. She wanted to see if she could get a refill sent to [facility]. I let pt know we may need an ov for this but would put a message in and let her know. Pt can be contacted at [phone].

## 2017-04-22 NOTE — Telephone Encounter (Signed)
Patient is going to make an appointment to see Dr. Brigitte Pulse

## 2017-04-23 ENCOUNTER — Ambulatory Visit: Payer: PPO | Admitting: Family Medicine

## 2017-04-23 ENCOUNTER — Other Ambulatory Visit: Payer: Self-pay

## 2017-04-23 ENCOUNTER — Encounter: Payer: Self-pay | Admitting: Family Medicine

## 2017-04-23 VITALS — BP 120/72 | HR 106 | Temp 98.5°F | Resp 18 | Ht 66.0 in | Wt 155.2 lb

## 2017-04-23 DIAGNOSIS — F5101 Primary insomnia: Secondary | ICD-10-CM

## 2017-04-23 MED ORDER — TRAZODONE HCL 50 MG PO TABS: 50.0000 mg | ORAL_TABLET | Freq: Every evening | ORAL | 5 refills | Status: DC | PRN

## 2017-04-23 NOTE — Progress Notes (Signed)
11/13/20182:19 PM  Nicole Delgado 01-03-1951, 66 y.o. female [id number]  Chief Complaint  Patient presents with  . Medication Refill    tramadol    HPI:   Patient is a 66 y.o. female who presents today requesting refill of her trazodone not tramadol. She states that she was prescribed trazodone for sleep over a year ago, she was taking 100mg  at bedtime and had a very deep restful sleep. She stopped it for concerns of elevated BP.   Patient reports that she goes to bed around 630pm as she wakes up around 330am. 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 She also runs daily. She states that she has been trying to get 8 hours of sleep every night but finds that many nights she will wake up in the middle of the night and have difficulty going back to sleep. She denies any anxiety or snoring. She has tried melatonin without much improvement.  She does not have a TV in her bedroom. She will lie in bed for as long as needed until able to get back to sleep.   Depression screen [facility] 2/9 04/23/2017 03/27/2017 01/28/2017  Decreased Interest 0 0 0  Down, Depressed, Hopeless 0 0 0  PHQ - 2 Score 0 0 0    Allergies  Allergen Reactions  . Bee Venom Swelling  . Penicillins Hives  . Valium [Diazepam] Other (See Comments)    Hallucinations    Prior to Admission medications   Medication Sig Start Date End Date Taking? Authorizing Provider  albuterol (PROVENTIL HFA;VENTOLIN HFA) 108 (90 Base) MCG/ACT inhaler Inhale 2 puffs into the lungs every 6 (six) hours as needed for wheezing or shortness of breath. 11/21/16  Yes Alveda Reasons, MD  alendronate (FOSAMAX) 70 MG tablet Take 1 tablet (70 mg total) by mouth every 7 (seven) days. Take with a full glass of water on an empty stomach. 04/11/17  Yes Shawnee Knapp, MD  atorvastatin (LIPITOR) 20 MG tablet Take 1 tablet (20 mg total) by mouth daily. 01/28/17  Yes McVey, Gelene Mink, PA-C  calcium carbonate (CALCIUM 600) 600 MG  TABS tablet Take 600 mg by mouth 2 (two) times daily with a meal.   Yes [provider]  cholecalciferol (VITAMIN D) 1000 units tablet Take 1,000 Units by mouth daily.   Yes [provider]  GLUCOSAMINE-CHONDROITIN PO Take 1 tablet by mouth 2 (two) times daily with a meal.   Yes [provider]  Multiple Vitamin (MULTIVITAMIN) capsule Take 1 capsule by mouth daily.   Yes [provider]  traZODone (DESYREL) 50 MG tablet Take 1-2 tablets (50-100 mg total) at bedtime as needed by mouth for sleep. 04/23/17   Rutherford Guys, MD    Past Medical History:  Diagnosis Date  . Allergy   . Asthma   . Keratoconus    followed by optho at [place], on retinal transplant list, legally blind    Past Surgical History:  Procedure Laterality Date  . ABDOMINAL HYSTERECTOMY  1995  . BREAST EXCISIONAL BIOPSY Bilateral    [id number], 1973, 1990 benign  . BREAST SURGERY     breast biopies x4  . rotator cuff surgery      Social History   Tobacco Use  . Smoking status: Never Smoker  . Smokeless tobacco: Never Used  Substance Use Topics  . Alcohol use: No    Family History  Problem Relation Age of Onset  . Alzheimer's disease  Mother   . Heart Problems Father   . Heart disease Father   . Cancer Brother   . Hypertension Sister   . Colon cancer Neg Hx     ROS Per HPI  OBJECTIVE:  Blood pressure 120/72, pulse (!) 106, temperature 98.5 F (36.9 C), temperature source Oral, resp. rate 18, height 5\' 6"  (1.676 m), weight 155 lb 3.2 oz (70.4 kg), SpO2 96 %.  Physical Exam  Constitutional: She is oriented to person, place, and time and well-developed, well-nourished, and in no distress.  HENT:  Head: Normocephalic and atraumatic.  Mouth/Throat: Mucous membranes are normal.  Eyes: EOM are normal. Pupils are equal, round, and reactive to light. No scleral icterus.  Neck: Neck supple.  Pulmonary/Chest: Effort normal.  Neurological: She is alert and oriented to  person, place, and time. Gait normal.  Skin: Skin is warm and dry.  Psychiatric: Mood and affect normal.  Nursing note and vitals reviewed.    ASSESSMENT and PLAN  1. Primary insomnia Re-prescribing trazodone again with monitoring of BP as that is a known side effect. Discussed trial of melatonin with 50mg  of trazodone to start. Discussed sleep hygiene. Patient educational handout given.   - traZODone (DESYREL) 50 MG tablet; Take 1-2 tablets (50-100 mg total) at bedtime as needed by mouth for sleep.  Return if symptoms worsen or fail to improve.    Rutherford Guys, MD Primary [facility] at [place], [place] [address] Ph.  [phone] Fax [phone]

## 2017-04-23 NOTE — Patient Instructions (Addendum)
   IF you received an x-ray today, you will receive an invoice from [facility]. Please contact  Radiology at [phone] with questions or concerns regarding your invoice.   IF you received labwork today, you will receive an invoice from [facility]. Please contact [facility] at [phone] with questions or concerns regarding your invoice.   Our billing staff will not be able to assist you with questions regarding bills from these companies.  You will be contacted with the lab results as soon as they are available. The fastest way to get your results is to activate your My Chart account. Instructions are located on the last page of this paperwork. If you have not heard from us regarding the results in 2 weeks, please contact this office.    Insomnia Insomnia is a sleep disorder that makes it difficult to fall asleep or to stay asleep. Insomnia can cause tiredness (fatigue), low energy, difficulty concentrating, mood swings, and poor performance at work or school. There are three different ways to classify insomnia:  Difficulty falling asleep.  Difficulty staying asleep.  Waking up too early in the morning.  Any type of insomnia can be long-term (chronic) or short-term (acute). Both are common. Short-term insomnia usually lasts for three months or less. Chronic insomnia occurs at least three times a week for longer than three months. What are the causes? Insomnia may be caused by another condition, situation, or substance, such as:  Anxiety.  Certain medicines.  Gastroesophageal reflux disease (GERD) or other gastrointestinal conditions.  Asthma or other breathing conditions.  Restless legs syndrome, sleep apnea, or other sleep disorders.  Chronic pain.  Menopause. This may include hot flashes.  Stroke.  Abuse of alcohol, tobacco, or illegal drugs.  Depression.  Caffeine.  Neurological disorders, such as Alzheimer disease.  An overactive thyroid  (hyperthyroidism).  The cause of insomnia may not be known. What increases the risk? Risk factors for insomnia include:  Gender. Women are more commonly affected than men.  Age. Insomnia is more common as you get older.  Stress. This may involve your professional or personal life.  Income. Insomnia is more common in people with lower income.  Lack of exercise.  Irregular work schedule or night shifts.  Traveling between different time zones.  What are the signs or symptoms? If you have insomnia, trouble falling asleep or trouble staying asleep is the main symptom. This may lead to other symptoms, such as:  Feeling fatigued.  Feeling nervous about going to sleep.  Not feeling rested in the morning.  Having trouble concentrating.  Feeling irritable, anxious, or depressed.  How is this treated? Treatment for insomnia depends on the cause. If your insomnia is caused by an underlying condition, treatment will focus on addressing the condition. Treatment may also include:  Medicines to help you sleep.  Counseling or therapy.  Lifestyle adjustments.  Follow these instructions at home:  Take medicines only as directed by your health care provider.  Keep regular sleeping and waking hours. Avoid naps.  Keep a sleep diary to help you and your health care provider figure out what could be causing your insomnia. Include: ? When you sleep. ? When you wake up during the night. ? How well you sleep. ? How rested you feel the next day. ? Any side effects of medicines you are taking. ? What you eat and drink.  Make your bedroom a comfortable place where it is easy to fall asleep: ? Put up shades or special blackout   curtains to block light from outside. ? Use a white noise machine to block noise. ? Keep the temperature cool.  Exercise regularly as directed by your health care provider. Avoid exercising right before bedtime.  Use relaxation techniques to manage stress. Ask  your health care provider to suggest some techniques that may work well for you. These may include: ? Breathing exercises. ? Routines to release muscle tension. ? Visualizing peaceful scenes.  Cut back on alcohol, caffeinated beverages, and cigarettes, especially close to bedtime. These can disrupt your sleep.  Do not overeat or eat spicy foods right before bedtime. This can lead to digestive discomfort that can make it hard for you to sleep.  Limit screen use before bedtime. This includes: ? Watching TV. ? Using your smartphone, tablet, and computer.  Stick to a routine. This can help you fall asleep faster. Try to do a quiet activity, brush your teeth, and go to bed at the same time each night.  Get out of bed if you are still awake after 15 minutes of trying to sleep. Keep the lights down, but try reading or doing a quiet activity. When you feel sleepy, go back to bed.  Make sure that you drive carefully. Avoid driving if you feel very sleepy.  Keep all follow-up appointments as directed by your health care provider. This is important. Contact a health care provider if:  You are tired throughout the day or have trouble in your daily routine due to sleepiness.  You continue to have sleep problems or your sleep problems get worse. Get help right away if:  You have serious thoughts about hurting yourself or someone else. This information is not intended to replace advice given to you by your health care provider. Make sure you discuss any questions you have with your health care provider. Document Released: 05/25/2000 Document Revised: 10/28/2015 Document Reviewed: 02/26/2014 Elsevier Interactive Patient Education  2018 [employer]vier Inc[employer]  

## 2017-05-22 ENCOUNTER — Encounter (HOSPITAL_COMMUNITY): Payer: Self-pay

## 2017-05-22 ENCOUNTER — Ambulatory Visit (INDEPENDENT_AMBULATORY_CARE_PROVIDER_SITE_OTHER): Payer: PPO

## 2017-05-22 ENCOUNTER — Other Ambulatory Visit: Payer: Self-pay

## 2017-05-22 ENCOUNTER — Ambulatory Visit (INDEPENDENT_AMBULATORY_CARE_PROVIDER_SITE_OTHER): Payer: PPO | Admitting: Physician Assistant

## 2017-05-22 ENCOUNTER — Telehealth: Payer: Self-pay | Admitting: Endocrinology

## 2017-05-22 ENCOUNTER — Encounter: Payer: Self-pay | Admitting: Physician Assistant

## 2017-05-22 VITALS — BP 128/84 | HR 73 | Temp 98.4°F | Resp 16 | Ht 65.0 in | Wt 157.0 lb

## 2017-05-22 DIAGNOSIS — M25562 Pain in left knee: Secondary | ICD-10-CM | POA: Diagnosis not present

## 2017-05-22 MED ORDER — MELOXICAM 15 MG PO TABS: 15.0000 mg | ORAL_TABLET | Freq: Every day | ORAL | 0 refills | Status: DC

## 2017-05-22 MED ORDER — CYCLOBENZAPRINE HCL 10 MG PO TABS: 10.0000 mg | ORAL_TABLET | Freq: Three times a day (TID) | ORAL | 0 refills | Status: DC | PRN

## 2017-05-22 NOTE — Telephone Encounter (Signed)
Pt called and wanted to speak with you  Pt did not state what it was for  Please advise

## 2017-05-22 NOTE — Patient Instructions (Addendum)
  Continue applying heat to your knee as needed for pain.  Start taking meloxicam 61m once daily for the next 2 weeks. I will call you if there is something concerning regarding your x-ray.   Meloxicam (Mobic) is an NSAID. Do not use with any other otc pain medication other than tylenol/acetaminophen - so no aleve, ibuprofen, motrin, advil, etc. You may take Tylenol with this.   Come back and see me in 2-3 weeks if you are still not better.   Thank you for coming in today. I hope you feel we met your needs.  Feel free to call PCP if you have any questions or further requests.  Please consider signing up for MyChart if you do not already have it, as this is a great way to communicate with me.  Best,  Whitney McVey, PA-C  IF you received an x-ray today, [facility]. Please contact [facility] at [phone] questions or concerns regarding your invoice.   IF you received labwork today, you will receive an invoice from [facility] Please contact [facility] at [phone] questions or concerns regarding your invoice.   Our billing staff will not be able to assist you with questions regarding bills from these companies.  You will be contacted with the lab results as soon as they are available. The fastest way to get your results is to activate your My Chart account. Instructions are located on the last page of this paperwork. If you have not heard from [facility] the results in 2 weeks, please contact this office.

## 2017-05-22 NOTE — Telephone Encounter (Signed)
Appt. Scheduled for [facility] next Monday

## 2017-05-22 NOTE — Telephone Encounter (Signed)
No need for repeat renal function, has been consistently normal and is not predisposed to renal dysfunction

## 2017-05-22 NOTE — Progress Notes (Signed)
Nicole Delgado  MRN: [id number] DOB: 1950-11-13  PCP: Shawnee Knapp, MD  Subjective:  Pt is a 66 year old female who presents to clinic for left knee pain x 1 week. She slipped on ice and caught herself "I think I jammed my knee". She did not fall. Pain is worse with walking. Located under her knee cap.  "I feels unsteady, like I'm going to fall." She takes Aleve at night before bed. She has tried heat, which helps a little, but starts again. Endorses muscle tightness of thigh.  Denies reduced ROM, swelling, redness, n/t, weakness.  She is a marathon runner.   Review of Systems  Musculoskeletal: Positive for arthralgias (left knee) and gait problem. Negative for joint swelling.  Skin: Negative.   Neurological: Negative for weakness and numbness.    Patient Active Problem List   Diagnosis Date Noted  . Keratoconus 03/27/2017  . Cough variant asthma vs UACS  12/25/2016  . Osteoporosis 11/06/2014  . Vitamin D deficiency 10/17/2014  . Hepatic adenoma 04/20/2013  . Chronic right shoulder pain 04/20/2013  . Insomnia 04/20/2013    Current Outpatient Medications on File Prior to Visit  Medication Sig Dispense Refill  . albuterol (PROVENTIL HFA;VENTOLIN HFA) 108 (90 Base) MCG/ACT inhaler Inhale 2 puffs into the lungs every 6 (six) hours as needed for wheezing or shortness of breath. 1 Inhaler 0  . atorvastatin (LIPITOR) 20 MG tablet Take 1 tablet (20 mg total) by mouth daily. 60 tablet 1  . calcium carbonate (CALCIUM 600) 600 MG TABS tablet Take 600 mg by mouth 2 (two) times daily with a meal.    . cholecalciferol (VITAMIN D) 1000 units tablet Take 1,000 Units by mouth daily.    . Multiple Vitamin (MULTIVITAMIN) capsule Take 1 capsule by mouth daily.    . traZODone (DESYREL) 50 MG tablet Take 1-2 tablets (50-100 mg total) at bedtime as needed by mouth for sleep. 60 tablet 5  . alendronate (FOSAMAX) 70 MG tablet Take 1 tablet (70 mg total) by mouth every 7 (seven) days. Take with a full  glass of water on an empty stomach. (Patient not taking: Reported on 05/22/2017) 12 tablet 4  . GLUCOSAMINE-CHONDROITIN PO Take 1 tablet by mouth 2 (two) times daily with a meal.     No current facility-administered medications on file prior to visit.     Allergies  Allergen Reactions  . Bee Venom Swelling  . Penicillins Hives  . Valium [Diazepam] Other (See Comments)    Hallucinations     Objective:  BP 128/84   Pulse 73   Temp 98.4 F (36.9 C) (Oral)   Resp 16   Ht 5\' 5"  (1.651 m)   Wt 157 lb (71.2 kg)   SpO2 95%   BMI 26.13 kg/m   Physical Exam  Constitutional: She is oriented to person, place, and time and well-developed, well-nourished, and in no distress. No distress.  Musculoskeletal:       Right knee: She exhibits normal range of motion, no swelling, no erythema and normal alignment. No tenderness found.       Left knee: She exhibits normal range of motion, no swelling, no effusion, no ecchymosis, no deformity, no erythema, no LCL laxity, normal patellar mobility, no bony tenderness, normal meniscus and no MCL laxity. No tenderness found. No patellar tendon tenderness noted.  Neurological: She is alert and oriented to person, place, and time. GCS score is 15.  Skin: Skin is warm and dry.  Psychiatric:  Mood, memory, affect and judgment normal.  Vitals reviewed.   Assessment and Plan :  1. Acute pain of left knee - DG Knee Complete 4 Views Left; Future - meloxicam (MOBIC) 15 MG tablet; Take 1 tablet (15 mg total) by mouth daily.  Dispense: 30 tablet; Refill: 0 - cyclobenzaprine (FLEXERIL) 10 MG tablet; Take 1 tablet (10 mg total) by mouth 3 (three) times daily as needed for muscle spasms.  Dispense: 30 tablet; Refill: 0 - Pt c/o knee pain x 1 week. No concerning findings on PE. X-ray machine is not communicating with server. Will await results in 24 hours. Plan to treat supportively with NSAID and flexeril. RTC in 3-4 weeks if no improvement. Consider ortho or sports  med referral.   Mercer Pod, PA-C  [facility] at [place] 05/22/2017 2:40 PM

## 2017-05-27 ENCOUNTER — Ambulatory Visit: Payer: PPO

## 2017-07-19 ENCOUNTER — Ambulatory Visit: Payer: Self-pay

## 2017-07-19 NOTE — Telephone Encounter (Signed)
Patient called and says "I have an appointment with you tomorrow and I was told to call if I have any questions. My temperature is 102, I'm still coughing hard and last night I had to put a warm cloth on my chest to ease it. What do I need to take for my temperature, is that bad?" I advised a temperature of 102 is alarming and elevated. I asked how long has she had the fever, she said "just today." I advised she can take tylenol every 4-6 hours today for fever and generalized aches. I advised if her coughing, breathing gets worse or if she develops pain in her chest that's not relieved after the coughing, to go to the ED, she verbalized understanding.

## 2017-07-20 ENCOUNTER — Encounter: Payer: Self-pay | Admitting: Family Medicine

## 2017-07-20 ENCOUNTER — Other Ambulatory Visit: Payer: Self-pay

## 2017-07-20 ENCOUNTER — Ambulatory Visit (INDEPENDENT_AMBULATORY_CARE_PROVIDER_SITE_OTHER): Payer: PPO | Admitting: Family Medicine

## 2017-07-20 VITALS — BP 138/78 | HR 90 | Temp 98.9°F | Resp 17 | Ht 65.0 in | Wt 158.6 lb

## 2017-07-20 DIAGNOSIS — J45991 Cough variant asthma: Secondary | ICD-10-CM | POA: Diagnosis not present

## 2017-07-20 DIAGNOSIS — M818 Other osteoporosis without current pathological fracture: Secondary | ICD-10-CM

## 2017-07-20 DIAGNOSIS — J4521 Mild intermittent asthma with (acute) exacerbation: Secondary | ICD-10-CM

## 2017-07-20 DIAGNOSIS — R509 Fever, unspecified: Secondary | ICD-10-CM | POA: Diagnosis not present

## 2017-07-20 DIAGNOSIS — J111 Influenza due to unidentified influenza virus with other respiratory manifestations: Secondary | ICD-10-CM | POA: Diagnosis not present

## 2017-07-20 LAB — POC INFLUENZA A&B (BINAX/QUICKVUE)
INFLUENZA B, POC: NEGATIVE
Influenza A, POC: POSITIVE — AB

## 2017-07-20 MED ORDER — ALBUTEROL SULFATE (2.5 MG/3ML) 0.083% IN NEBU
2.5000 mg | INHALATION_SOLUTION | Freq: Once | RESPIRATORY_TRACT | Status: AC
  Administered 2017-07-20: 2.5 mg via RESPIRATORY_TRACT

## 2017-07-20 MED ORDER — OSELTAMIVIR PHOSPHATE 75 MG PO CAPS: 75.0000 mg | ORAL_CAPSULE | Freq: Two times a day (BID) | ORAL | 0 refills | Status: DC

## 2017-07-20 MED ORDER — METHYLPREDNISOLONE ACETATE 80 MG/ML IJ SUSP
80.0000 mg | Freq: Once | INTRAMUSCULAR | Status: AC
  Administered 2017-07-20: 80 mg via INTRAMUSCULAR

## 2017-07-20 MED ORDER — BENZONATATE 200 MG PO CAPS: 200.0000 mg | ORAL_CAPSULE | Freq: Three times a day (TID) | ORAL | 0 refills | Status: DC | PRN

## 2017-07-20 MED ORDER — AZITHROMYCIN 250 MG PO TABS: ORAL_TABLET | ORAL | 0 refills | Status: DC

## 2017-07-20 MED ORDER — HYDROCODONE-HOMATROPINE 5-1.5 MG/5ML PO SYRP: 5.0000 mL | ORAL_SOLUTION | Freq: Three times a day (TID) | ORAL | 0 refills | Status: DC | PRN

## 2017-07-20 NOTE — Progress Notes (Addendum)
Subjective:  By signing my name below, I, Essence Howell, attest that this documentation has been prepared under the direction and in the presence of Delman Cheadle, MD Electronically Signed: Ladene Artist, ED Scribe 07/20/2017 at 11:21 AM.   Patient ID: Nicole Delgado, female    DOB: January 10, 1951, 67 y.o.   MRN: [id number]  Chief Complaint  Patient presents with  . Cough    since 07/20/17, chills and fever of 103 on thursday, tylenol for fever and cepacol cough gtts for cough, ribcage and abdomen sore from all the coughing.  No otc  meds today for symptoms.  Did try mucinex on thursday for cough but didn't help and didn't take anymore   HPI Nicole Delgado is a 67 y.o. female who presents to Primary Care at [facility] complaining of fever of 10 F yesterday. Pt reports associated symptoms of chills, cough, ribcage soreness that she attributes to couhging. Pt has tried Tylenol, Cepacol, Mucinex without significant relief. Denies sob. She has not needed her inhaler.  Past Medical History:  Diagnosis Date  . Allergy   . Asthma   . Keratoconus    followed by optho at [facility], on retinal transplant list, legally blind   Current Outpatient Medications on File Prior to Visit  Medication Sig Dispense Refill  . albuterol (PROVENTIL HFA;VENTOLIN HFA) 108 (90 Base) MCG/ACT inhaler Inhale 2 puffs into the lungs every 6 (six) hours as needed for wheezing or shortness of breath. 1 Inhaler 0  . atorvastatin (LIPITOR) 20 MG tablet Take 1 tablet (20 mg total) by mouth daily. 60 tablet 1  . calcium carbonate (CALCIUM 600) 600 MG TABS tablet Take 600 mg by mouth 2 (two) times daily with a meal.    . cholecalciferol (VITAMIN D) 1000 units tablet Take 1,000 Units by mouth daily.    Marland Kitchen GLUCOSAMINE-CHONDROITIN PO Take 1 tablet by mouth 2 (two) times daily with a meal.    . Multiple Vitamin (MULTIVITAMIN) capsule Take 1 capsule by mouth daily.    . traZODone (DESYREL) 50 MG tablet Take 1-2 tablets (50-100 mg total) at bedtime as  needed by mouth for sleep. 60 tablet 5  . alendronate (FOSAMAX) 70 MG tablet Take 1 tablet (70 mg total) by mouth every 7 (seven) days. Take with a full glass of water on an empty stomach. (Patient not taking: Reported on 05/22/2017) 12 tablet 4  . cyclobenzaprine (FLEXERIL) 10 MG tablet Take 1 tablet (10 mg total) by mouth 3 (three) times daily as needed for muscle spasms. (Patient not taking: Reported on 07/20/2017) 30 tablet 0  . meloxicam (MOBIC) 15 MG tablet Take 1 tablet (15 mg total) by mouth daily. (Patient not taking: Reported on 07/20/2017) 30 tablet 0   No current facility-administered medications on file prior to visit.    Allergies  Allergen Reactions  . Bee Venom Swelling  . Penicillins Hives  . Valium [Diazepam] Other (See Comments)    Hallucinations   Past Surgical History:  Procedure Laterality Date  . ABDOMINAL HYSTERECTOMY  1995  . BREAST EXCISIONAL BIOPSY Bilateral    [id number], 1973, 1990 benign  . BREAST SURGERY     breast biopies x4  . rotator cuff surgery     Family History  Problem Relation Age of Onset  . Alzheimer's disease Mother   . Heart Problems Father   . Heart disease Father   . Cancer Brother   . Hypertension Sister   . Colon cancer Neg Hx    Social  History   Socioeconomic History  . Marital status: Divorced    Spouse name: None  . Number of children: None  . Years of education: None  . Highest education level: None  Social Needs  . Financial resource strain: None  . Food insecurity - worry: None  . Food insecurity - inability: None  . Transportation needs - medical: None  . Transportation needs - non-medical: None  Occupational History  . Occupation: Realtor  Tobacco Use  . Smoking status: Never Smoker  . Smokeless tobacco: Never Used  Substance and Sexual Activity  . Alcohol use: No  . Drug use: No  . Sexual activity: Yes    Birth control/protection: Surgical  Other Topics Concern  . None  Social History Narrative  . None    Depression screen [facility] 2/9 07/20/2017 05/22/2017 04/23/2017 03/27/2017 01/28/2017  Decreased Interest 0 0 0 0 0  Down, Depressed, Hopeless 0 0 0 0 0  PHQ - 2 Score 0 0 0 0 0    Review of Systems  Constitutional: Positive for activity change, appetite change, chills, diaphoresis, fatigue and fever. Negative for unexpected weight change.  HENT: Positive for voice change.   Respiratory: Positive for cough. Negative for apnea, choking, chest tightness, shortness of breath, wheezing and stridor.   Cardiovascular: Positive for chest pain (ribs sore from coughing).  Gastrointestinal: Positive for abdominal pain (epigastrium sore from coughing).  Psychiatric/Behavioral: Positive for sleep disturbance.      Objective:   Physical Exam  Constitutional: She is oriented to person, place, and time. She appears well-developed and well-nourished. No distress.  HENT:  Head: Normocephalic and atraumatic.  Eyes: Conjunctivae and EOM are normal.  Neck: Neck supple. No tracheal deviation present.  Cardiovascular: Normal rate and regular rhythm.  Pulmonary/Chest: Effort normal and breath sounds normal. No respiratory distress.  Musculoskeletal: Normal range of motion.  Lymphadenopathy:       Head (right side): Submandibular adenopathy present.       Head (left side): Submandibular adenopathy present.    She has no cervical adenopathy.  Neurological: She is alert and oriented to person, place, and time.  Skin: Skin is warm and dry.  Psychiatric: She has a normal mood and affect. Her behavior is normal.  Nursing note and vitals reviewed.  BP 138/78 (BP Location: Right Arm, Patient Position: Sitting, Cuff Size: Normal)   Pulse 90   Temp 98.9 F (37.2 C) (Oral)   Resp 17   Ht 5\' 5"  (1.651 m)   Wt 158 lb 9.6 oz (71.9 kg)   SpO2 100%   BMI 26.39 kg/m     Results for orders placed or performed in visit on 07/20/17  POC Influenza A&B(BINAX/QUICKVUE)  Result Value Ref Range   Influenza A, POC  Positive (A) Negative   Influenza B, POC Negative Negative   Assessment & Plan:   1. Influenza with respiratory manifestation other than pneumonia   2. Fever, unspecified   3. Cough variant asthma vs UACS    4. Mild intermittent asthma with acute exacerbation    Cover with tamiflu since sxs started 48 hrs prior and pt is at high risk for complications due to mild cough-varient asthma which is exacerbated so cover with prednisone and try for cough suppression. Gave [facility] rx for zpack in case sxs worsen after 48 hrs since high risk.   Osteoporosis - need to discuss at f/u - pt was referred to endocrine for Reclast but initially had a lot of problems with  cost/insurance coverage - then was finally scheduled for infusion 05/27/17 but she cancelled appt. Not taking fosamax . Next endocrine appt is not for 6 mos - 8/6.  Orders Placed This Encounter  Procedures  . POC Influenza A&B(BINAX/QUICKVUE)    Meds ordered this encounter  Medications  . albuterol (PROVENTIL) (2.5 MG/3ML) 0.083% nebulizer solution 2.5 mg  . methylPREDNISolone acetate (DEPO-MEDROL) injection 80 mg  . oseltamivir (TAMIFLU) 75 MG capsule    Sig: Take 1 capsule (75 mg total) by mouth 2 (two) times daily.    Dispense:  10 capsule    Refill:  0  . azithromycin (ZITHROMAX) 250 MG tablet    Sig: Take 2 tabs PO x 1 dose, then 1 tab PO QD x 4 days    Dispense:  6 tablet    Refill:  0  . HYDROcodone-homatropine (HYCODAN) 5-1.5 MG/5ML syrup    Sig: Take 5 mLs by mouth every 8 (eight) hours as needed for cough.    Dispense:  120 mL    Refill:  0  . benzonatate (TESSALON) 200 MG capsule    Sig: Take 1 capsule (200 mg total) by mouth 3 (three) times daily as needed for cough.    Dispense:  30 capsule    Refill:  0    I personally performed the services described in this documentation, which was scribed in my presence. The recorded information has been reviewed and considered, and addended by me as needed.   Delman Cheadle, M.D.   Primary Care at [facility] [address] [place], [place] [address] [phone] phone [phone] fax  07/22/17 12:07 AM

## 2017-07-20 NOTE — Patient Instructions (Addendum)
IF you received an x-ray today, you will receive an invoice from [facility]. Please contact [facility] at [phone] with questions or concerns regarding your invoice.   IF you received labwork today, you will receive an invoice from [facility]. Please contact [facility] at [phone] with questions or concerns regarding your invoice.   Our billing staff will not be able to assist you with questions regarding bills from these companies.  You will be contacted with the lab results as soon as they are available. The fastest way to get your results is to activate your My Chart account. Instructions are located on the last page of this paperwork. If you have not heard from [place] regarding the results in 2 weeks, please contact this office.     Influenza, Adult Influenza, more commonly known as "the flu," is a viral infection that primarily affects the respiratory tract. The respiratory tract includes organs that help you breathe, such as the lungs, nose, and throat. The flu causes many common cold symptoms, as well as a high fever and body aches. The flu spreads easily from person to person (is contagious). Getting a flu shot (influenza vaccination) every year is the best way to prevent influenza. What are the causes? Influenza is caused by a virus. You can catch the virus by:  Breathing in droplets from an infected person's cough or sneeze.  Touching something that was recently contaminated with the virus and then touching your mouth, nose, or eyes.  What increases the risk? The following factors may make you more likely to get the flu:  Not cleaning your hands frequently with soap and water or alcohol-based hand sanitizer.  Having close contact with many people during cold and flu season.  Touching your mouth, eyes, or nose without washing or sanitizing your hands first.  Not drinking enough fluids or not eating a healthy diet.  Not getting enough sleep or  exercise.  Being under a high amount of stress.  Not getting a yearly (annual) flu shot.  You may be at a higher risk of complications from the flu, such as a severe lung infection (pneumonia), if you:  Are over the age of 30.  Are pregnant.  Have a weakened disease-fighting system (immune system). You may have a weakened immune system if you: ? Have HIV or AIDS. ? Are undergoing chemotherapy. ? Aretaking medicines that reduce the activity of (suppress) the immune system.  Have a long-term (chronic) illness, such as heart disease, kidney disease, diabetes, or lung disease.  Have a liver disorder.  Are obese.  Have anemia.  What are the signs or symptoms? Symptoms of this condition typically last 4-10 days and may include:  Fever.  Chills.  Headache, body aches, or muscle aches.  Sore throat.  Cough.  Runny or congested nose.  Chest discomfort and cough.  Poor appetite.  Weakness or tiredness (fatigue).  Dizziness.  Nausea or vomiting.  How is this diagnosed? This condition may be diagnosed based on your medical history and a physical exam. Your health care provider may do a nose or throat swab test to confirm the diagnosis. How is this treated? If influenza is detected early, you can be treated with antiviral medicine that can reduce the length of your illness and the severity of your symptoms. This medicine may be given by mouth (orally) or through an IV tube that is inserted in one of your veins. The goal of treatment is to relieve symptoms by taking care  of yourself at home. This may include taking over-the-counter medicines, drinking plenty of fluids, and adding humidity to the air in your home. In some cases, influenza goes away on its own. Severe influenza or complications from influenza may be treated in a hospital. Follow these instructions at home:  Take over-the-counter and prescription medicines only as told by your health care provider.  Use a  cool mist humidifier to add humidity to the air in your home. This can make breathing easier.  Rest as needed.  Drink enough fluid to keep your urine clear or pale yellow.  Cover your mouth and nose when you cough or sneeze.  Wash your hands with soap and water often, especially after you cough or sneeze. If soap and water are not available, use hand sanitizer.  Stay home from work or school as told by your health care provider. Unless you are visiting your health care provider, try to avoid leaving home until your fever has been gone for 24 hours without the use of medicine.  Keep all follow-up visits as told by your health care provider. This is important. How is this prevented?  Getting an annual flu shot is the best way to avoid getting the flu. You may get the flu shot in late summer, fall, or winter. Ask your health care provider when you should get your flu shot.  Wash your hands often or use hand sanitizer often.  Avoid contact with people who are sick during cold and flu season.  Eat a healthy diet, drink plenty of fluids, get enough sleep, and exercise regularly. Contact a health care provider if:  You develop new symptoms.  You have: ? Chest pain. ? Diarrhea. ? A fever.  Your cough gets worse.  You produce more mucus.  You feel nauseous or you vomit. Get help right away if:  You develop shortness of breath or difficulty breathing.  Your skin or nails turn a bluish color.  You have severe pain or stiffness in your neck.  You develop a sudden headache or sudden pain in your face or ear.  You cannot stop vomiting. This information is not intended to replace advice given to you by your health care provider. Make sure you discuss any questions you have with your health care provider. Document Released: 05/25/2000 Document Revised: 11/03/2015 Document Reviewed: 03/22/2015 Elsevier Interactive Patient Education  2017 [employer]  Bronchospasm,  Adult Bronchospasm is a tightening of the airways going into the lungs. During an episode, it may be harder to breathe. You may cough, and you may make a whistling sound when you breathe (wheeze). This condition often affects people with asthma. What are the causes? This condition is caused by swelling and irritation in the airways. It can be triggered by:  An infection (common).  Seasonal allergies.  An allergic reaction.  Exercise.  Irritants. These include pollution, cigarette smoke, strong odors, aerosol sprays, and paint fumes.  Weather changes. Winds increase molds and pollens in the air. Cold air may cause swelling.  Stress and emotional upset.  What are the signs or symptoms? Symptoms of this condition include:  Wheezing. If the episode was triggered by an allergy, wheezing may start right away or hours later.  Nighttime coughing.  Frequent or severe coughing with a simple cold.  Chest tightness.  Shortness of breath.  Decreased ability to exercise.  How is this diagnosed? This condition is usually diagnosed with a review of your medical history and a physical exam. Tests, such  as lung function tests, are sometimes done to look for other conditions. The need for a chest X-ray depends on where the wheezing occurs and whether it is the first time you have wheezed. How is this treated? This condition may be treated with:  Inhaled medicines. These open up the airways and help you breathe. They can be taken with an inhaler or a nebulizer device.  Corticosteroid medicines. These may be given for severe bronchospasm, usually when it is associated with asthma.  Avoiding triggers, such as irritants, infection, or allergies.  Follow these instructions at home: Medicines  Take over-the-counter and prescription medicines only as told by your health care provider.  If you need to use an inhaler or nebulizer to take your medicine, ask your health care provider to explain  how to use it correctly. If you were given a spacer, always use it with your inhaler. Lifestyle  Reduce the number of triggers in your home. To do this: ? Change your heating and air conditioning filter at least once a month. ? Limit your use of fireplaces and wood stoves. ? Do not smoke. Do not allow smoking in your home. ? Avoid using perfumes and fragrances. ? Get rid of pests, such as roaches and mice, and their droppings. ? Remove any mold from your home. ? Keep your house clean and dust free. Use unscented cleaning products. ? Replace carpet with wood, tile, or vinyl flooring. Carpet can trap dander and dust. ? Use allergy-proof pillows, mattress covers, and box spring covers. ? Wash bed sheets and blankets every week in hot water. Dry them in a dryer. ? Use blankets that are made of polyester or cotton. ? Wash your hands often. ? Do not allow pets in your bedroom.  Avoid breathing in cold air when you exercise. General instructions  Have a plan for seeking medical care. Know when to call your health care provider and local emergency services, and where to get emergency care.  Stay up to date on your immunizations.  When you have an episode of bronchospasm, stay calm. Try to relax and breathe more slowly.  If you have asthma, make sure you have an asthma action plan.  Keep all follow-up visits as told by your health care provider. This is important. Contact a health care provider if:  You have muscle aches.  You have chest pain.  The mucus that you cough up (sputum) changes from clear or white to yellow, green, gray, or bloody.  You have a fever.  Your sputum gets thicker. Get help right away if:  Your wheezing and coughing get worse, even after you take your prescribed medicines.  It gets even harder to breathe.  You develop severe chest pain. Summary  Bronchospasm is a tightening of the airways going into the lungs.  During an episode of bronchospasm, you  may have a harder time breathing. You may cough and make a whistling sound when you breathe (wheeze).  Avoid exposure to triggers such as smoke, dust, mold, animal dander, and fragrances.  When you have an episode of bronchospasm, stay calm. Try to relax and breathe more slowly. This information is not intended to replace advice given to you by your health care provider. Make sure you discuss any questions you have with your health care provider. Document Released: 05/31/2003 Document Revised: 05/24/2016 Document Reviewed: 05/24/2016 Elsevier Interactive Patient Education  2017 [employer].

## 2017-07-24 NOTE — Addendum Note (Signed)
Addended by: Shawnee Knapp on: 07/24/2017 11:08 AM   Modules accepted: Orders

## 2017-07-25 ENCOUNTER — Ambulatory Visit: Payer: Self-pay | Admitting: *Deleted

## 2017-07-25 NOTE — Telephone Encounter (Signed)
Pt called because she is having neck stiffness that started 2 days ago; she also says that her cough has not got better since her visit on 07/20/17 and this is most concerning for her; she also states that the cough syrup nor the tessalon  Have worked; nurse triage initiated and recommendations made per protocol to see physician within 24 hours; spoke with Rudene Christians at [facility] and she will notify Dr Brigitte Pulse of this encounter; pt also scheduled appointment with Dr Brigitte Pulse 07/26/17 at 1200; pt verbalizes understanding; please call pt; she can be contacted at [phone], and cancel tomorrow's appointment if it is not needed.  Reason for Disposition . [1] Continuous (nonstop) coughing interferes with work or school AND [2] no improvement using cough treatment per Care Advice  Answer Assessment - Initial Assessment Questions 1. ONSET: "When did the cough begin?"      Seen  In office 2/29/19 2. SEVERITY: "How bad is the cough today?"      severe 3. RESPIRATORY DISTRESS: "Describe your breathing."     wheezing 4. FEVER: "Do you have a fever?" If so, ask: "What is your temperature, how was it measured, and when did it start?"     99.1 today oral digital thermometer 5. SPUTUM: "Describe the color of your sputum" (clear, white, yellow, green)     yellow 6. HEMOPTYSIS: "Are you coughing up any blood?" If so ask: "How much?" (flecks, streaks, tablespoons, etc.)     no 7. CARDIAC HISTORY: "Do you have any history of heart disease?" (e.g., heart attack, congestive heart failure)      no 8. LUNG HISTORY: "Do you have any history of lung disease?"  (e.g., pulmonary embolus, asthma, emphysema)     asthma 9. PE RISK FACTORS: "Do you have a history of blood clots?" (or: recent major surgery, recent prolonged travel, bedridden )     no 10. OTHER SYMPTOMS: "Do you have any other symptoms?" (e.g., runny nose, wheezing, chest pain)       Wheezing, sore/stiff neck, runny nose  11. PREGNANCY: "Is there any chance you are  pregnant?" "When was your last menstrual period?"  no 12. TRAVEL: "Have you traveled out of the country in the last month?" (e.g., travel history, exposures)       no  Protocols used: [place]

## 2017-07-25 NOTE — Telephone Encounter (Signed)
Agree, pt needs to be seen tomorrow - needs CXR.

## 2017-07-26 ENCOUNTER — Ambulatory Visit (INDEPENDENT_AMBULATORY_CARE_PROVIDER_SITE_OTHER): Payer: PPO

## 2017-07-26 ENCOUNTER — Ambulatory Visit: Payer: PPO | Admitting: Family Medicine

## 2017-07-26 ENCOUNTER — Encounter: Payer: Self-pay | Admitting: Family Medicine

## 2017-07-26 VITALS — BP 127/65 | HR 84 | Temp 98.7°F | Resp 16 | Ht 65.0 in | Wt 157.2 lb

## 2017-07-26 DIAGNOSIS — J4521 Mild intermittent asthma with (acute) exacerbation: Secondary | ICD-10-CM

## 2017-07-26 DIAGNOSIS — R05 Cough: Secondary | ICD-10-CM | POA: Diagnosis not present

## 2017-07-26 DIAGNOSIS — R059 Cough, unspecified: Secondary | ICD-10-CM

## 2017-07-26 LAB — POCT CBC
GRANULOCYTE PERCENT: 51.5 % (ref 37–80)
HCT, POC: 38.5 % (ref 37.7–47.9)
Hemoglobin: 12.5 g/dL (ref 12.2–16.2)
Lymph, poc: 2.2 (ref 0.6–3.4)
MCH: 27 pg (ref 27–31.2)
MCHC: 32.4 g/dL (ref 31.8–35.4)
MCV: 83.3 fL (ref 80–97)
MID (cbc): 0.3 (ref 0–0.9)
MPV: 7.5 fL (ref 0–99.8)
PLATELET COUNT, POC: 204 10*3/uL (ref 142–424)
POC Granulocyte: 2.7 (ref 2–6.9)
POC LYMPH PERCENT: 42.6 %L (ref 10–50)
POC MID %: 5.9 %M (ref 0–12)
RBC: 4.62 M/uL (ref 4.04–5.48)
RDW, POC: 12.7 %
WBC: 5.2 10*3/uL (ref 4.6–10.2)

## 2017-07-26 LAB — POCT SEDIMENTATION RATE: POCT SED RATE: 30 mm/h — AB (ref 0–22)

## 2017-07-26 MED ORDER — ALBUTEROL SULFATE (2.5 MG/3ML) 0.083% IN NEBU
2.5000 mg | INHALATION_SOLUTION | Freq: Once | RESPIRATORY_TRACT | Status: AC
  Administered 2017-07-26: 2.5 mg via RESPIRATORY_TRACT

## 2017-07-26 MED ORDER — PREDNISONE 20 MG PO TABS: ORAL_TABLET | ORAL | 0 refills | Status: DC

## 2017-07-26 MED ORDER — IPRATROPIUM BROMIDE 0.02 % IN SOLN
0.5000 mg | Freq: Once | RESPIRATORY_TRACT | Status: AC
  Administered 2017-07-26: 0.5 mg via RESPIRATORY_TRACT

## 2017-07-26 NOTE — Progress Notes (Addendum)
Subjective:  By signing my name below, I, Essence Howell, attest that this documentation has been prepared under the direction and in the presence of Delman Cheadle, MD Electronically Signed: Ladene Artist, ED Scribe 07/26/2017 at 12:11 PM.   Patient ID: Nicole Delgado, female    DOB: 1950-10-22, 67 y.o.   MRN: [id number]  Chief Complaint  Patient presents with  . Cough    follow up; still having a cough; no production of mucus  . lack of appetite   HPI Nicole Delgado is a 67 y.o. female who presents to Primary Care at [facility] complaining for f/u on dry cough. Seen 6 days prior with symptoms that had just started 2 days before. Fever up to 103 F and severe barking cough. Flu test was positive for influenza type A and was treated with Tamiflu. She was also given DepoMedrol 80 mg IM injection. Had no improvement of symptoms form dup neb in the office as she was at high risk of complication due to cough variant asthma. Given script for z-pak but, instructed not to fill it unless symptoms worsened after day 4-5 of illness. Cough suppression with benzonatate and hycodan. She called into the office yesterday, reported 2 days or neck stiffness and no improvement in cough with cough suppressants being completely ineffective. Still febrile though low grade. Cough productive, yellow sputum, no blood in sputum. Wheezing. Positive rhinitis which is just starting and worsening.  Temp and chills resolved 48 hours after our last visit.  Cough is very severe. And back on neck is tight and causing HAs.  Using albuterol a ton and has to use 4 puffs at a time before it starts working.  Needs to use it more than 1-2x/d.  Can move neck easily - just feels like muscle tight and radiates up head and gets much worse when she coughs.  Drinking plenty of fluids and keeping urine light. Trying to eat but   She never got the [place]cl[place] as her car broke down   Past Medical History:  Diagnosis Date  . Allergy   . Asthma   .  Keratoconus    followed by optho at [place], on retinal transplant list, legally blind   Current Outpatient Medications on File Prior to Visit  Medication Sig Dispense Refill  . albuterol (PROVENTIL HFA;VENTOLIN HFA) 108 (90 Base) MCG/ACT inhaler Inhale 2 puffs into the lungs every 6 (six) hours as needed for wheezing or shortness of breath. 1 Inhaler 0  . alendronate (FOSAMAX) 70 MG tablet Take 1 tablet (70 mg total) by mouth every 7 (seven) days. Take with a full glass of water on an empty stomach. 12 tablet 4  . atorvastatin (LIPITOR) 20 MG tablet Take 20 mg by mouth daily.    . benzonatate (TESSALON) 200 MG capsule Take 1 capsule (200 mg total) by mouth 3 (three) times daily as needed for cough. 30 capsule 0  . calcium carbonate (CALCIUM 600) 600 MG TABS tablet Take 600 mg by mouth 2 (two) times daily with a meal.    . cholecalciferol (VITAMIN D) 1000 units tablet Take 1,000 Units by mouth daily.    Marland Kitchen GLUCOSAMINE-CHONDROITIN PO Take 1 tablet by mouth 2 (two) times daily with a meal.    . Multiple Vitamin (MULTIVITAMIN) capsule Take 1 capsule by mouth daily.    . traZODone (DESYREL) 50 MG tablet Take 1-2 tablets (50-100 mg total) at bedtime as needed by mouth for sleep. 60 tablet 5   No current  facility-administered medications on file prior to visit.    Allergies  Allergen Reactions  . Bee Venom Swelling  . Penicillins Hives  . Valium [Diazepam] Other (See Comments)    Hallucinations   Past Surgical History:  Procedure Laterality Date  . ABDOMINAL HYSTERECTOMY  1995  . BREAST EXCISIONAL BIOPSY Bilateral    [id number], 1973, 1990 benign  . BREAST SURGERY     breast biopies x4  . rotator cuff surgery     Family History  Problem Relation Age of Onset  . Alzheimer's disease Mother   . Heart Problems Father   . Heart disease Father   . Cancer Brother   . Hypertension Sister   . Colon cancer Neg Hx    Social History   Socioeconomic History  . Marital status: Divorced    Spouse  name: None  . Number of children: None  . Years of education: None  . Highest education level: None  Social Needs  . Financial resource strain: None  . Food insecurity - worry: None  . Food insecurity - inability: None  . Transportation needs - medical: None  . Transportation needs - non-medical: None  Occupational History  . Occupation: Realtor  Tobacco Use  . Smoking status: Never Smoker  . Smokeless tobacco: Never Used  Substance and Sexual Activity  . Alcohol use: No  . Drug use: No  . Sexual activity: Yes    Birth control/protection: Surgical  Other Topics Concern  . None  Social History Narrative  . None   Depression screen [facility] 2/9 07/20/2017 05/22/2017 04/23/2017 03/27/2017 01/28/2017  Decreased Interest 0 0 0 0 0  Down, Depressed, Hopeless 0 0 0 0 0  PHQ - 2 Score 0 0 0 0 0    Review of Systems  Respiratory: Positive for cough.       Objective:   Physical Exam  Constitutional: She is oriented to person, place, and time. She appears well-developed and well-nourished. No distress.  HENT:  Head: Normocephalic and atraumatic.  Eyes: Conjunctivae and EOM are normal.  Neck: Neck supple. No tracheal deviation present.  Cardiovascular: Normal rate.  Pulmonary/Chest: Effort normal. No respiratory distress. She has rhonchi.  Lungs clear. L lower lobe expiratory rhonchi. Frequent severe barking cough, unrelenting.  Musculoskeletal: Normal range of motion.  Neurological: She is alert and oriented to person, place, and time.  Skin: Skin is warm and dry.  Psychiatric: She has a normal mood and affect. Her behavior is normal.  Nursing note and vitals reviewed.  BP 127/65   Pulse 84   Temp 98.7 F (37.1 C) (Oral)   Resp 16   Ht 5\' 5"  (1.651 m)   Wt 157 lb 3.2 oz (71.3 kg)   SpO2 100%   BMI 26.16 kg/m     Dg Chest 2 View  Result Date: 07/26/2017 CLINICAL DATA:  Continued severe barking cough. No improvement with antibiotics or steroids. EXAM: CHEST  2 VIEW  COMPARISON:  Two-view chest x-ray 11/26/2016 FINDINGS: The heart size and mediastinal contours are within normal limits. Both lungs are clear. The visualized skeletal structures are unremarkable. IMPRESSION: Negative two view chest x-ray Electronically Signed   By: San Morelle M.D.   On: 07/26/2017 12:22    Results for orders placed or performed in visit on 07/26/17  POCT CBC  Result Value Ref Range   WBC 5.2 4.6 - 10.2 K/uL   Lymph, poc 2.2 0.6 - 3.4   POC LYMPH PERCENT 42.6 10 - 50 %  L   MID (cbc) 0.3 0 - 0.9   POC MID % 5.9 0 - 12 %M   POC Granulocyte 2.7 2 - 6.9   Granulocyte percent 51.5 37 - 80 %G   RBC 4.62 4.04 - 5.48 M/uL   Hemoglobin 12.5 12.2 - 16.2 g/dL   HCT, POC 38.5 37.7 - 47.9 %   MCV 83.3 80 - 97 fL   MCH, POC 27.0 27 - 31.2 pg   MCHC 32.4 31.8 - 35.4 g/dL   RDW, POC 12.7 %   Platelet Count, POC 204 142 - 424 K/uL   MPV 7.5 0 - 99.8 fL  POCT SEDIMENTATION RATE  Result Value Ref Range   POCT SED RATE 30 (A) 0 - 22 mm/hr    Assessment & Plan:   1. Cough   2. Mild intermittent asthma with acute exacerbation   Was diagnosed with Influenza type A 07/20/17 - never got the tamiflu rx but did end up filling and completing a snap rx for zpack due to her severity of sxs and concern for secondary infection with the cough varient asthma. Last visit pt was given a Depo-Medrol IM injection and she reports worsening after an initial 3-5d of improvement after our last OV so I wonder if the worsening was due to the depo-medrol wearing off. After neb in office, cough did decrease though pt was still w/ expiratory wheeze.  Orders Placed This Encounter  Procedures  . DG Chest 2 View    Standing Status:   Future    Number of Occurrences:   1    Standing Expiration Date:   07/26/2018    Order Specific Question:   Reason for Exam (SYMPTOM  OR DIAGNOSIS REQUIRED)    Answer:   continued severe barking cough, failed azithro and steroid, h/o cough variant asthma    Order Specific  Question:   Preferred imaging location?    Answer:   External  . POCT CBC  . POCT SEDIMENTATION RATE    Meds ordered this encounter  Medications  . albuterol (PROVENTIL) (2.5 MG/3ML) 0.083% nebulizer solution 2.5 mg  . ipratropium (ATROVENT) nebulizer solution 0.5 mg  . predniSONE (DELTASONE) 20 MG tablet    Sig: Take 3 tabs qd x 3d, then 2 tabs qd x 3d then 1 tab qd x 3d.    Dispense:  18 tablet    Refill:  0    I personally performed the services described in this documentation, which was scribed in my presence. The recorded information has been reviewed and considered, and addended by me as needed.   Delman Cheadle, M.D.  Primary Care at [facility] [address] [place], [place] [address] [phone] phone [phone] fax  07/29/17 12:35 AM

## 2017-07-26 NOTE — Patient Instructions (Addendum)
Cough, Adult A cough helps to clear your throat and lungs. A cough may last only 2-3 weeks (acute), or it may last longer than 8 weeks (chronic). Many different things can cause a cough. A cough may be a sign of an illness or another medical condition. Follow these instructions at home:  Pay attention to any changes in your cough.  Take medicines only as told by your doctor. ? If you were prescribed an antibiotic medicine, take it as told by your doctor. Do not stop taking it even if you start to feel better. ? Talk with your doctor before you try using a cough medicine.  Drink enough fluid to keep your pee (urine) clear or pale yellow.  If the air is dry, use a cold steam vaporizer or humidifier in your home.  Stay away from things that make you cough at work or at home.  If your cough is worse at night, try using extra pillows to raise your head up higher while you sleep.  Do not smoke, and try not to be around smoke. If you need help quitting, ask your doctor.  Do not have caffeine.  Do not drink alcohol.  Rest as needed. Contact a doctor if:  You have new problems (symptoms).  You cough up yellow fluid (pus).  Your cough does not get better after 2-3 weeks, or your cough gets worse.  Medicine does not help your cough and you are not sleeping well.  You have pain that gets worse or pain that is not helped with medicine.  You have a fever.  You are losing weight and you do not know why.  You have night sweats. Get help right away if:  You cough up blood.  You have trouble breathing.  Your heartbeat is very fast. This information is not intended to replace advice given to you by your health care provider. Make sure you discuss any questions you have with your health care provider. Document Released: 02/08/2011 Document Revised: 11/03/2015 Document Reviewed: 08/04/2014 Elsevier Interactive Patient Education  2018 [employer].     IF you received an x-ray  today, you will receive an invoice from [facility]. Please contact [facility] at [phone] with questions or concerns regarding your invoice.   IF you received labwork today, you will receive an invoice from [facility]. Please contact [facility] at [phone] with questions or concerns regarding your invoice.   Our billing staff will not be able to assist you with questions regarding bills from these companies.  You will be contacted with the lab results as soon as they are available. The fastest way to get your results is to activate your My Chart account. Instructions are located on the last page of this paperwork. If you have not heard from [place] regarding the results in 2 weeks, please contact this office.      Asthma, Acute Bronchospasm Acute bronchospasm caused by asthma is also referred to as an asthma attack. Bronchospasm means your air passages become narrowed. The narrowing is caused by inflammation and tightening of the muscles in the air tubes (bronchi) in your lungs. This can make it hard to breathe or cause you to wheeze and cough. What are the causes? Possible triggers are:  Animal dander from the skin, hair, or feathers of animals.  Dust mites contained in house dust.  Cockroaches.  Pollen from trees or grass.  Mold.  Cigarette or tobacco smoke.  Air pollutants such as dust, household cleaners, hair sprays, aerosol  sprays, paint fumes, strong chemicals, or strong odors.  Cold air or weather changes. Cold air may trigger inflammation. Winds increase molds and pollens in the air.  Strong emotions such as crying or laughing hard.  Stress.  Certain medicines such as aspirin or beta-blockers.  Sulfites in foods and drinks, such as dried fruits and wine.  Infections or inflammatory conditions, such as a flu, cold, or inflammation of the nasal membranes (rhinitis).  Gastroesophageal reflux disease (GERD). GERD is a condition where stomach acid  backs up into your esophagus.  Exercise or strenuous activity.  What are the signs or symptoms?  Wheezing.  Excessive coughing, particularly at night.  Chest tightness.  Shortness of breath. How is this diagnosed? Your health care provider will ask you about your medical history and perform a physical exam. A chest X-ray or blood testing may be performed to look for other causes of your symptoms or other conditions that may have triggered your asthma attack. How is this treated? Treatment is aimed at reducing inflammation and opening up the airways in your lungs. Most asthma attacks are treated with inhaled medicines. These include quick relief or rescue medicines (such as bronchodilators) and controller medicines (such as inhaled corticosteroids). These medicines are sometimes given through an inhaler or a nebulizer. Systemic steroid medicine taken by mouth or given through an IV tube also can be used to reduce the inflammation when an attack is moderate or severe. Antibiotic medicines are only used if a bacterial infection is present. Follow these instructions at home:  Rest.  Drink plenty of liquids. This helps the mucus to remain thin and be easily coughed up. Only use caffeine in moderation and do not use alcohol until you have recovered from your illness.  Do not smoke. Avoid being exposed to secondhand smoke.  You play a critical role in keeping yourself in good health. Avoid exposure to things that cause you to wheeze or to have breathing problems.  Keep your medicines up-to-date and available. Carefully follow your health care provider's treatment plan.  Take your medicine exactly as prescribed.  When pollen or pollution is bad, keep windows closed and use an air conditioner or go to places with air conditioning.  Asthma requires careful medical care. See your health care provider for a follow-up as advised. If you are more than [redacted] weeks pregnant and you were prescribed any  new medicines, let your obstetrician know about the visit and how you are doing. Follow up with your health care provider as directed.  After you have recovered from your asthma attack, make an appointment with your outpatient doctor to talk about ways to reduce the likelihood of future attacks. If you do not have a doctor who manages your asthma, make an appointment with a primary care doctor to discuss your asthma. Get help right away if:  You are getting worse.  You have trouble breathing. If severe, call your local emergency services (911 in the [place]  You develop chest pain or discomfort.  You are vomiting.  You are not able to keep fluids down.  You are coughing up yellow, green, brown, or bloody sputum.  You have a fever and your symptoms suddenly get worse.  You have trouble swallowing. This information is not intended to replace advice given to you by your health care provider. Make sure you discuss any questions you have with your health care provider. Document Released: 09/12/2006 Document Revised: 11/09/2015 Document Reviewed: 12/03/2012 Elsevier Interactive Patient Education  2017  [place] [employer].  How to Use a Metered Dose Inhaler A metered dose inhaler is a handheld device for taking medicine that must be breathed into the lungs (inhaled). The device can be used to deliver a variety of inhaled medicines, including:  Quick relief or rescue medicines, such as bronchodilators.  Controller medicines, such as corticosteroids.  The medicine is delivered by pushing down on a metal canister to release a preset amount of spray and medicine. Each device contains the amount of medicine that is needed for a preset number of uses (inhalations). Your health care provider may recommend that you use a spacer with your inhaler to help you take the medicine more effectively. A spacer is a plastic tube with a mouthpiece on one end and an opening that connects to the inhaler on the other  end. A spacer holds the medicine in a tube for a short time, which allows you to inhale more medicine. What are the risks? If you do not use your inhaler correctly, medicine might not reach your lungs to help you breathe. Inhaler medicine can cause side effects, such as:  Mouth or throat infection.  Cough.  Hoarseness.  Headache.  Nausea and vomiting.  Lung infection (pneumonia) in people who have a lung condition called COPD.  How to use a metered dose inhaler without a spacer 1. Remove the cap from the inhaler. 2. If you are using the inhaler for the first time, shake it for 5 seconds, turn it away from your face, then release 4 puffs into the air. This is called priming. 3. Shake the inhaler for 5 seconds. 4. Position the inhaler so the top of the canister faces up. 5. Put your index finger on the top of the medicine canister. Support the bottom of the inhaler with your thumb. 6. Breathe out normally and as completely as possible, away from the inhaler. 7. Either place the inhaler between your teeth and close your lips tightly around the mouthpiece, or hold the inhaler 1-2 inches (2.5-5 cm) away from your open mouth. Keep your tongue down out of the way. If you are unsure which technique to use, ask your health care provider. 8. Press the canister down with your index finger to release the medicine, then inhale deeply and slowly through your mouth (not your nose) until your lungs are completely filled. Inhaling should take 4-6 seconds. 9. Hold the medicine in your lungs for 5-10 seconds (10 seconds is best). This helps the medicine get into the small airways of your lungs. 10. With your lips in a tight circle (pursed), breathe out slowly. 11. Repeat steps 3-10 until you have taken the number of puffs that your health care provider directed. Wait about 1 minute between puffs or as directed. 12. Put the cap on the inhaler. 13. If you are using a steroid inhaler, rinse your mouth with  water, gargle, and spit out the water. Do not swallow the water. How to use a metered dose inhaler with a spacer 1. Remove the cap from the inhaler. 2. If you are using the inhaler for the first time, shake it for 5 seconds, turn it away from your face, then release 4 puffs into the air. This is called priming. 3. Shake the inhaler for 5 seconds. 4. Place the open end of the spacer onto the inhaler mouthpiece. 5. Position the inhaler so the top of the canister faces up and the spacer mouthpiece faces you. 6. Put your index finger on the top  of the medicine canister. Support the bottom of the inhaler and the spacer with your thumb. 7. Breathe out normally and as completely as possible, away from the spacer. 8. Place the spacer between your teeth and close your lips tightly around it. Keep your tongue down out of the way. 9. Press the canister down with your index finger to release the medicine, then inhale deeply and slowly through your mouth (not your nose) until your lungs are completely filled. Inhaling should take 4-6 seconds. 10. Hold the medicine in your lungs for 5-10 seconds (10 seconds is best). This helps the medicine get into the small airways of your lungs. 11. With your lips in a tight circle (pursed), breathe out slowly. 12. Repeat steps 3-11 until you have taken the number of puffs that your health care provider directed. Wait about 1 minute between puffs or as directed. 13. Remove the spacer from the inhaler and put the cap on the inhaler. 14. If you are using a steroid inhaler, rinse your mouth with water, gargle, and spit out the water. Do not swallow the water. Follow these instructions at home:  Take your inhaled medicine only as told by your health care provider. Do not use the inhaler more than directed by your health care provider.  Keep all follow-up visits as told by your health care provider. This is important.  If your inhaler has a counter, you can check it to  determine how full your inhaler is. If your inhaler does not have a counter, ask your health care provider when you will need to refill your inhaler and write the refill date on a calendar or on your inhaler canister. Note that you cannot know when an inhaler is empty by shaking it.  Follow directions on the package insert for care and cleaning of your inhaler and spacer. Contact a health care provider if:  Symptoms are only partially relieved with your inhaler.  You are having trouble using your inhaler.  You have an increase in phlegm.  You have headaches. Get help right away if:  You feel little or no relief after using your inhaler.  You have dizziness.  You have a fast heart rate.  You have chills or a fever.  You have night sweats.  There is blood in your phlegm. Summary  A metered dose inhaler is a handheld device for taking medicine that must be breathed into the lungs (inhaled).  The medicine is delivered by pushing down on a metal canister to release a preset amount of spray and medicine.  Each device contains the amount of medicine that is needed for a preset number of uses (inhalations). This information is not intended to replace advice given to you by your health care provider. Make sure you discuss any questions you have with your health care provider. Document Released: 05/28/2005 Document Revised: 04/17/2016 Document Reviewed: 04/17/2016 Elsevier Interactive Patient Education  2017 [employer].

## 2017-08-06 ENCOUNTER — Other Ambulatory Visit: Payer: Self-pay | Admitting: Family Medicine

## 2017-08-06 MED ORDER — ATORVASTATIN CALCIUM 20 MG PO TABS: 20.0000 mg | ORAL_TABLET | Freq: Every day | ORAL | 0 refills | Status: DC

## 2017-08-06 NOTE — Telephone Encounter (Signed)
Copied from [place]. Topic: Quick Communication - Rx Refill/Question >> Aug 06, 2017  3:01 PM Oliver Pila B wrote: Medication: atorvastatin (LIPITOR) 20 MG tablet [[id number]]   Has the patient contacted their pharmacy? Yes.     (Agent: If no, request that the patient contact the pharmacy for the refill.)   Preferred Pharmacy (with phone number or street name): [facility]   Agent: Please be advised that RX refills may take up to 3 business days. We ask that you follow-up with your pharmacy.

## 2017-10-23 ENCOUNTER — Encounter: Payer: Self-pay | Admitting: Gastroenterology

## 2017-11-26 DIAGNOSIS — Z1231 Encounter for screening mammogram for malignant neoplasm of breast: Secondary | ICD-10-CM | POA: Diagnosis not present

## 2017-11-29 ENCOUNTER — Other Ambulatory Visit: Payer: Self-pay | Admitting: Obstetrics and Gynecology

## 2017-11-29 DIAGNOSIS — R928 Other abnormal and inconclusive findings on diagnostic imaging of breast: Secondary | ICD-10-CM

## 2017-12-04 ENCOUNTER — Other Ambulatory Visit: Payer: Self-pay | Admitting: Obstetrics and Gynecology

## 2017-12-04 ENCOUNTER — Ambulatory Visit
Admission: RE | Admit: 2017-12-04 | Discharge: 2017-12-04 | Disposition: A | Discharge status: Home / Self Care | Payer: PPO | Source: Ambulatory Visit | Attending: Obstetrics and Gynecology | Admitting: Obstetrics and Gynecology

## 2017-12-04 DIAGNOSIS — N6320 Unspecified lump in the left breast, unspecified quadrant: Secondary | ICD-10-CM | POA: Diagnosis not present

## 2017-12-04 DIAGNOSIS — R922 Inconclusive mammogram: Secondary | ICD-10-CM | POA: Diagnosis not present

## 2017-12-04 DIAGNOSIS — R928 Other abnormal and inconclusive findings on diagnostic imaging of breast: Secondary | ICD-10-CM

## 2017-12-10 ENCOUNTER — Ambulatory Visit (AMBULATORY_SURGERY_CENTER): Payer: Self-pay

## 2017-12-10 ENCOUNTER — Other Ambulatory Visit: Payer: Self-pay

## 2017-12-10 VITALS — Ht 66.0 in | Wt 152.8 lb

## 2017-12-10 DIAGNOSIS — Z86010 Personal history of colonic polyps: Secondary | ICD-10-CM

## 2017-12-10 NOTE — Progress Notes (Signed)
Denies allergies to eggs or soy products. Denies complication of anesthesia or sedation. Denies use of weight loss medication. Denies use of O2.   Emmi instructions declined.  

## 2017-12-11 ENCOUNTER — Encounter: Payer: Self-pay | Admitting: Gastroenterology

## 2017-12-11 ENCOUNTER — Other Ambulatory Visit: Payer: Self-pay | Admitting: Obstetrics and Gynecology

## 2017-12-11 ENCOUNTER — Ambulatory Visit
Admission: RE | Admit: 2017-12-11 | Discharge: 2017-12-11 | Disposition: A | Discharge status: Home / Self Care | Payer: PPO | Source: Ambulatory Visit | Attending: Obstetrics and Gynecology | Admitting: Obstetrics and Gynecology

## 2017-12-11 DIAGNOSIS — N6320 Unspecified lump in the left breast, unspecified quadrant: Secondary | ICD-10-CM

## 2017-12-11 DIAGNOSIS — N6322 Unspecified lump in the left breast, upper inner quadrant: Secondary | ICD-10-CM | POA: Diagnosis not present

## 2017-12-11 DIAGNOSIS — N6012 Diffuse cystic mastopathy of left breast: Secondary | ICD-10-CM | POA: Diagnosis not present

## 2017-12-11 HISTORY — PX: BREAST BIOPSY: SHX20

## 2017-12-11 HISTORY — PX: BIOPSY BREAST: PRO8

## 2017-12-23 ENCOUNTER — Telehealth: Payer: Self-pay | Admitting: Gastroenterology

## 2017-12-23 NOTE — Telephone Encounter (Signed)
Returned phone call to patient. She ate a bite of carrot salad on Saturday. Advised her to follow clear liquid diet today and prep as instructed. She understands.

## 2017-12-24 ENCOUNTER — Ambulatory Visit (AMBULATORY_SURGERY_CENTER): Payer: PPO | Admitting: Gastroenterology

## 2017-12-24 ENCOUNTER — Encounter: Payer: Self-pay | Admitting: Gastroenterology

## 2017-12-24 VITALS — BP 131/73 | HR 79 | Temp 98.6°F | Resp 15 | Ht 65.0 in | Wt 157.0 lb

## 2017-12-24 DIAGNOSIS — D120 Benign neoplasm of cecum: Secondary | ICD-10-CM | POA: Diagnosis not present

## 2017-12-24 DIAGNOSIS — Z86010 Personal history of colonic polyps: Secondary | ICD-10-CM | POA: Diagnosis not present

## 2017-12-24 DIAGNOSIS — D123 Benign neoplasm of transverse colon: Secondary | ICD-10-CM

## 2017-12-24 DIAGNOSIS — D122 Benign neoplasm of ascending colon: Secondary | ICD-10-CM

## 2017-12-24 DIAGNOSIS — Z1211 Encounter for screening for malignant neoplasm of colon: Secondary | ICD-10-CM | POA: Diagnosis not present

## 2017-12-24 DIAGNOSIS — K635 Polyp of colon: Secondary | ICD-10-CM

## 2017-12-24 MED ORDER — SODIUM CHLORIDE 0.9 % IV SOLN
500.0000 mL | Freq: Once | INTRAVENOUS | Status: DC
Start: 2017-12-24 — End: 2019-02-16

## 2017-12-24 NOTE — Op Note (Signed)
[place] Patient Name: Nicole Delgado Procedure Date: 12/24/2017 1:36 PM MRN: [id number] Endoscopist: Remo Lipps P. Havery Moros , MD Age: 67 Referring MD:  Date of Birth: [birth year] Gender: Female Account #: [id number] Procedure:                Colonoscopy Indications:              Surveillance: Personal history of adenomatous                            polyps on last colonoscopy 5 years ago Medicines:                Monitored Anesthesia Care Procedure:                Pre-Anesthesia Assessment:                           - Prior to the procedure, a History and Physical                            was performed, and patient medications and                            allergies were reviewed. The patient's tolerance of                            previous anesthesia was also reviewed. The risks                            and benefits of the procedure and the sedation                            options and risks were discussed with the patient.                            All questions were answered, and informed consent                            was obtained. Prior Anticoagulants: The patient has                            taken no previous anticoagulant or antiplatelet                            agents. ASA Grade Assessment: II - A patient with                            mild systemic disease. After reviewing the risks                            and benefits, the patient was deemed in                            satisfactory condition to undergo the procedure.  After obtaining informed consent, the colonoscope                            was passed under direct vision. Throughout the                            procedure, the patient's blood pressure, pulse, and                            oxygen saturations were monitored continuously. The                            Model PCF-[id number]) scope was introduced                            through the anus and  advanced to the the cecum,                            identified by appendiceal orifice and ileocecal                            valve. The colonoscopy was performed without                            difficulty. The patient tolerated the procedure                            well. The quality of the bowel preparation was                            good. The ileocecal valve, appendiceal orifice, and                            rectum were photographed. Scope In: 1:48:10 PM Scope Out: 2:14:15 PM Scope Withdrawal Time: 0 hours 21 minutes 43 seconds  Total Procedure Duration: 0 hours 26 minutes 5 seconds  Findings:                 The perianal and digital rectal examinations were                            normal.                           A 4 mm polyp was found in the cecum. The polyp was                            sessile. The polyp was removed with a cold snare.                            Resection and retrieval were complete.                           A 3 mm polyp was found in the ascending colon. The  polyp was sessile. The polyp was removed with a                            cold snare. Resection and retrieval were complete.                           A 4-5 mm polyp was found in the hepatic flexure.                            The polyp was sessile. The polyp was removed with a                            cold snare. Resection and retrieval were complete.                           A 3 mm polyp was found in the transverse colon. The                            polyp was sessile. The polyp was removed with a                            cold snare. Resection and retrieval were complete.                           A single large-mouthed diverticulum was found in                            the ascending colon.                           The IC valve was normal. I thought a photo of it                            was taken, but it was not. There was poor air                             retension in the left colon which prolonged the                            procedure. The exam was otherwise without                            abnormality. Complications:            No immediate complications. Estimated blood loss:                            Minimal. Estimated Blood Loss:     Estimated blood loss was minimal. Impression:               - One 4 mm polyp in the cecum, removed with a cold  snare. Resected and retrieved.                           - One 3 mm polyp in the ascending colon, removed                            with a cold snare. Resected and retrieved.                           - One 4-5 mm polyp at the hepatic flexure, removed                            with a cold snare. Resected and retrieved.                           - One 3 mm polyp in the transverse colon, removed                            with a cold snare. Resected and retrieved.                           - Diverticulosis in the ascending colon.                           - The examination was otherwise normal. Recommendation:           - Patient has a contact number available for                            emergencies. The signs and symptoms of potential                            delayed complications were discussed with the                            patient. Return to normal activities tomorrow.                            Written discharge instructions were provided to the                            patient.                           - Resume previous diet.                           - Continue present medications.                           - Await pathology results.                           - Repeat colonoscopy for surveillance based on  pathology results. Remo Lipps P. Armbruster, MD 12/24/2017 2:20:26 PM This report has been signed electronically.

## 2017-12-24 NOTE — Progress Notes (Signed)
To PACU, VSS. Report to [email or web address] 

## 2017-12-24 NOTE — Patient Instructions (Addendum)
Handout given to patient and carepartner on polyps and diverticulosis.  YOU HAD AN ENDOSCOPIC PROCEDURE TODAY AT [facility]:   Refer to the procedure report that was given to you for any specific questions about what was found during the examination.  If the procedure report does not answer your questions, please call your gastroenterologist to clarify.  If you requested that your care partner not be given the details of your procedure findings, then the procedure report has been included in a sealed envelope for you to review at your convenience later.  YOU SHOULD EXPECT: Some feelings of bloating in the abdomen. Passage of more gas than usual.  Walking can help get rid of the air that was put into your GI tract during the procedure and reduce the bloating. If you had a lower endoscopy (such as a colonoscopy or flexible sigmoidoscopy) you may notice spotting of blood in your stool or on the toilet paper. If you underwent a bowel prep for your procedure, you may not have a normal bowel movement for a few days.  Please Note:  You might notice some irritation and congestion in your nose or some drainage.  This is from the oxygen used during your procedure.  There is no need for concern and it should clear up in a day or so.  SYMPTOMS TO REPORT IMMEDIATELY:   Following lower endoscopy (colonoscopy or flexible sigmoidoscopy):  Excessive amounts of blood in the stool  Significant tenderness or worsening of abdominal pains  Swelling of the abdomen that is new, acute  Fever of 100F or higher   For urgent or emergent issues, a gastroenterologist can be reached at any hour by calling [phone].   DIET:  We do recommend a small meal at first, but then you may proceed to your regular diet.  Drink plenty of fluids but you should avoid alcoholic beverages for 24 hours.  ACTIVITY:  You should plan to take it easy for the rest of today and you should NOT DRIVE or use heavy machinery  until tomorrow (because of the sedation medicines used during the test).    FOLLOW UP: Our staff will call the number listed on your records the next business day following your procedure to check on you and address any questions or concerns that you may have regarding the information given to you following your procedure. If we do not reach you, we will leave a message.  However, if you are feeling well and you are not experiencing any problems, there is no need to return our call.  We will assume that you have returned to your regular daily activities without incident.  If any biopsies were taken you will be contacted by phone or by letter within the next 1-3 weeks.  Please call us at [phone] if you have not heard about the biopsies in 3 weeks.    SIGNATURES/CONFIDENTIALITY: You and/or your care partner have signed paperwork which will be entered into your electronic medical record.  These signatures attest to the fact that that the information above on your After Visit Summary has been reviewed and is understood.  Full responsibility of the confidentiality of this discharge information lies with you and/or your care-partner.

## 2017-12-24 NOTE — Progress Notes (Signed)
Pt's states no medical or surgical changes since previsit or office visit. 

## 2017-12-24 NOTE — Progress Notes (Signed)
Called to room to assist during endoscopic procedure.  Patient ID and intended procedure confirmed with present staff. Received instructions for my participation in the procedure from the performing physician.  

## 2017-12-25 ENCOUNTER — Telehealth: Payer: Self-pay

## 2017-12-25 NOTE — Telephone Encounter (Signed)
  Follow up Call-  Call back number 12/24/2017  Post procedure Call Back phone  # [phone]  Permission to leave phone message No  Some recent data might be hidden     Voicemail heard but pt did not want message left Margueritte Guthridge/Call-back

## 2017-12-25 NOTE — Telephone Encounter (Signed)
Patient returned call states she is doing fine.

## 2018-01-09 ENCOUNTER — Other Ambulatory Visit: Payer: Self-pay | Admitting: Endocrinology

## 2018-01-09 DIAGNOSIS — E559 Vitamin D deficiency, unspecified: Secondary | ICD-10-CM

## 2018-01-09 DIAGNOSIS — M810 Age-related osteoporosis without current pathological fracture: Secondary | ICD-10-CM

## 2018-01-10 ENCOUNTER — Telehealth: Payer: Self-pay | Admitting: Family Medicine

## 2018-01-10 ENCOUNTER — Other Ambulatory Visit: Payer: PPO

## 2018-01-10 NOTE — Telephone Encounter (Signed)
Talked to pt and advised them that the appt on 01/16/18 will be at the [facility] [address]. Advised of time, location and late policy.

## 2018-01-14 ENCOUNTER — Ambulatory Visit: Payer: PPO | Admitting: Endocrinology

## 2018-01-16 ENCOUNTER — Encounter: Payer: Self-pay | Admitting: Family Medicine

## 2018-01-16 ENCOUNTER — Ambulatory Visit (INDEPENDENT_AMBULATORY_CARE_PROVIDER_SITE_OTHER): Payer: PPO | Admitting: Family Medicine

## 2018-01-16 ENCOUNTER — Other Ambulatory Visit: Payer: Self-pay

## 2018-01-16 VITALS — BP 118/78 | HR 91 | Temp 98.0°F | Resp 16 | Ht 66.93 in | Wt 148.4 lb

## 2018-01-16 DIAGNOSIS — Z1383 Encounter for screening for respiratory disorder NEC: Secondary | ICD-10-CM | POA: Diagnosis not present

## 2018-01-16 DIAGNOSIS — Z1389 Encounter for screening for other disorder: Secondary | ICD-10-CM

## 2018-01-16 DIAGNOSIS — Z136 Encounter for screening for cardiovascular disorders: Secondary | ICD-10-CM | POA: Diagnosis not present

## 2018-01-16 DIAGNOSIS — E559 Vitamin D deficiency, unspecified: Secondary | ICD-10-CM

## 2018-01-16 DIAGNOSIS — M810 Age-related osteoporosis without current pathological fracture: Secondary | ICD-10-CM | POA: Diagnosis not present

## 2018-01-16 DIAGNOSIS — Z0000 Encounter for general adult medical examination without abnormal findings: Secondary | ICD-10-CM | POA: Diagnosis not present

## 2018-01-16 DIAGNOSIS — E7800 Pure hypercholesterolemia, unspecified: Secondary | ICD-10-CM

## 2018-01-16 LAB — POCT URINALYSIS DIP (MANUAL ENTRY)
BILIRUBIN UA: NEGATIVE mg/dL
Bilirubin, UA: NEGATIVE
Blood, UA: NEGATIVE
Glucose, UA: NEGATIVE mg/dL
LEUKOCYTES UA: NEGATIVE
Nitrite, UA: NEGATIVE
PH UA: 5.5 (ref 5.0–8.0)
PROTEIN UA: NEGATIVE mg/dL
SPEC GRAV UA: 1.01 (ref 1.010–1.025)
UROBILINOGEN UA: 0.2 U/dL

## 2018-01-16 MED ORDER — DOXEPIN HCL 10 MG PO CAPS: 10.0000 mg | ORAL_CAPSULE | Freq: Every evening | ORAL | 3 refills | Status: DC | PRN

## 2018-01-16 NOTE — Progress Notes (Addendum)
Patient ID: Nicole Delgado, female   DOB: 1950-08-12, 67 y.o.   MRN: [id number]    Subjective:    Patient ID: Nicole Delgado, female    DOB: 1951-05-11, 67 y.o.   MRN: [id number]  HPI Chief Complaint  Patient presents with  . Annual Exam    HPI Comments: Nicole Delgado is a 67 y.o. female who presents to Primary Care at [facility] for her annual exam.  Primary preventatie care: Cancer Screening: Mammogram: Normal 12/04/2016 at [facility]. No paps needed due to age and s/p hysterectomy in 1995 for benign indications of fibroids - still has ovaries. Pelvic last done in 11/28/16 by Dr. Paula Compton. CRS: colonoscopy done 10/2012 by Dr. Deatra Ina with benign polyps. Repeat in 5 years  Bone scan: Initial was just done 09/2014 which showed osteoporosis with T score of -3.4 at [facility]. Has not been repeated yet. Saw Dr. Dwyane Dee for this 1 year prior. Has order to schedule next He advised her to start IV reclast as she should avoid oral medications. Pt is about to run the [facility], but to be qualified she's runing 2 other marathons in [place] and [place]. She's running up to 9 miles around 6:30 am. She notes a bubble that pops up after she runs and it occasionally turn purple. Denies chest pain, palpitations while running. Pt has fell 2x in the past month. Pt's fingers no longer curl and she no longer has hand pain. Pt drinks almond milk, and suspects there is added calcium. Pt has been a vegetarian for 25 years.  Fall Risk  07/20/2017 05/22/2017 04/23/2017 03/27/2017 01/28/2017  Falls in the past year? No No Yes Yes No  Number falls in past yr: - - 2 or more - -  Injury with Fall? - - No - -   Chest Pain: After frequent coughing earlier this year she's felt left midline chest pain - coughing has resolved. She now feels chest pain when she's sleeping. Denies feeling it when she lifts heavy objects, heartburn, chest pain when she takes a deep breath, and SOB.   Immunizations: prevnar 13 done  08/2014 Immunization History  Administered Date(s) Administered  . Pneumococcal Conjugate-13 09/03/2014  . Pneumococcal Polysaccharide-23 12/01/2015   Neg Hep C, hgba1c 5.7 2016, nl B12 2016, nl TSH annually. [place] November, 2014 showed 2 liver lesions, suspected benign adenomas.   Vision: No exam data present Vitamin D def: Pt is taking Vit. D 800 units BID, but no calcium suplements.   Insomnia: on trazodone prn  Primary Preventative Screenings: Cervical Cancer:  Family Planning: STI screening: Breast Cancer: Colorectal Cancer: Tobacco use/EtOH/substances: Bone Density: Cardiac: Weight/Blood sugar/Diet/Exercise: BMI Readings from Last 3 Encounters:  12/24/17 26.13 kg/m  12/10/17 24.66 kg/m  07/26/17 26.16 kg/m   Lab Results  Component Value Date   HGBA1C 5.7 09/03/2014   OTC/Vit/Supp/Herbal: Dentist/Optho: Immunizations:  Immunization History  Administered Date(s) Administered  . Pneumococcal Conjugate-13 09/03/2014  . Pneumococcal Polysaccharide-23 12/01/2015     Chronic Medical Conditions:     Past Medical History:  Diagnosis Date  . Allergy   . Asthma   . Keratoconus    followed by optho at [facility], on retinal transplant list, legally blind  . Osteoporosis    Past Surgical History:  Procedure Laterality Date  . ABDOMINAL HYSTERECTOMY  1995  . BIOPSY BREAST Left 12/11/2017   states she was advised biopsy was benign  . BREAST EXCISIONAL BIOPSY Bilateral    [id number], 1973, 1990 benign  .  BREAST SURGERY     breast biopies x4  . rotator cuff surgery     Current Outpatient Medications on File Prior to Visit  Medication Sig Dispense Refill  . albuterol (PROVENTIL HFA;VENTOLIN HFA) 108 (90 Base) MCG/ACT inhaler Inhale 2 puffs into the lungs every 6 (six) hours as needed for wheezing or shortness of breath. 1 Inhaler 0  . calcium carbonate (CALCIUM 600) 600 MG TABS tablet Take 600 mg by mouth 2 (two) times daily with a meal.    . cholecalciferol  (VITAMIN D) 1000 units tablet Take 1,000 Units by mouth daily.    Marland Kitchen GLUCOSAMINE-CHONDROITIN PO Take 1 tablet by mouth 2 (two) times daily with a meal.    . Multiple Vitamin (MULTIVITAMIN) capsule Take 1 capsule by mouth daily.    . traZODone (DESYREL) 50 MG tablet Take 1-2 tablets (50-100 mg total) at bedtime as needed by mouth for sleep. 60 tablet 5   Current Facility-Administered Medications on File Prior to Visit  Medication Dose Route Frequency Provider Last Rate Last Dose  . 0.9 %  sodium chloride infusion  500 mL Intravenous Once Armbruster, Carlota Raspberry, MD       Allergies  Allergen Reactions  . Bee Venom Swelling  . Penicillins Hives  . Valium [Diazepam] Other (See Comments)    Hallucinations   Family History  Problem Relation Age of Onset  . Alzheimer's disease Mother   . Heart Problems Father   . Heart disease Father   . Cancer Brother   . Hypertension Sister   . Colon cancer Neg Hx   . Esophageal cancer Neg Hx   . Liver cancer Neg Hx   . Pancreatic cancer Neg Hx   . Rectal cancer Neg Hx   . Stomach cancer Neg Hx    Social History   Socioeconomic History  . Marital status: Divorced    Spouse name: Not on file  . Number of children: Not on file  . Years of education: Not on file  . Highest education level: Not on file  Occupational History  . Occupation: Cabin crew  Social Needs  . Financial resource strain: Not on file  . Food insecurity:    Worry: Not on file    Inability: Not on file  . Transportation needs:    Medical: Not on file    Non-medical: Not on file  Tobacco Use  . Smoking status: Never Smoker  . Smokeless tobacco: Never Used  Substance and Sexual Activity  . Alcohol use: No  . Drug use: No  . Sexual activity: Yes    Birth control/protection: Surgical  Lifestyle  . Physical activity:    Days per week: Not on file    Minutes per session: Not on file  . Stress: Not on file  Relationships  . Social connections:    Talks on phone: Not on file     Gets together: Not on file    Attends religious service: Not on file    Active member of club or organization: Not on file    Attends meetings of clubs or organizations: Not on file    Relationship status: Not on file  Other Topics Concern  . Not on file  Social History Narrative  . Not on file   Depression screen [facility] 2/9 07/20/2017 05/22/2017 04/23/2017 03/27/2017 01/28/2017  Decreased Interest 0 0 0 0 0  Down, Depressed, Hopeless 0 0 0 0 0  PHQ - 2 Score 0 0 0 0 0  Review of Systems  Respiratory: Negative for shortness of breath.   Cardiovascular: Positive for chest pain. Negative for palpitations.  Musculoskeletal: Negative for arthralgias.  All other systems reviewed and are negative.  Objective:  Physical Exam  Constitutional: She appears well-developed and well-nourished. No distress.  HENT:  Head: Normocephalic and atraumatic.  Fluid behind the ears with retraction and congestion Pale boggy  Oropharynx nl  Eyes: Conjunctivae are normal.  Neck: Neck supple. No thyromegaly present.  Cardiovascular: Normal rate, regular rhythm and normal heart sounds. Exam reveals no gallop and no friction rub.  No murmur heard. Pulses:      Dorsalis pedis pulses are 2+ on the right side, and 2+ on the left side.  Pulmonary/Chest: Effort normal and breath sounds normal. No respiratory distress. She has no wheezes. She has no rhonchi. She has no rales.  Abdominal: Soft. Bowel sounds are normal. She exhibits no distension. There is no tenderness.  Musculoskeletal: She exhibits no edema (lower extremity).  Neurological: She is alert.  Skin: Skin is warm and dry.  Psychiatric: She has a normal mood and affect. Her behavior is normal.  Nursing note and vitals reviewed.   Vitals:   01/16/18 1121  BP: 118/78  Pulse: 91  Resp: 16  Temp: 98 F (36.7 C)  TempSrc: Oral  SpO2: 95%  Weight: 148 lb 6.4 oz (67.3 kg)  Height: 5' 6.93" (1.7 m)    Visual Acuity Screening   Right eye Left  eye Both eyes  Without correction:     With correction: 20/40 20/40 20/50       Assessment & Plan:   Plantare fasciitis -self diagnosed/treated - make an appt for xrays and eval to see if need specialty referral  1. Annual physical exam   2. Screening for cardiovascular, respiratory, and genitourinary diseases   3. Age related osteoporosis, unspecified pathological fracture presence  - needs to update dexa and will refer to [facility]- pt agree  4. Vitamin D deficiency - at last yr - rec pt go up to 4K-5K otc qd  5. Pure hypercholesterolemia - rec pt consider starting a chol med but she continues to decline   Orders Placed This Encounter  Procedures  . Lipid panel    Order Specific Question:   Has the patient fasted?    Answer:   Yes  . Comprehensive metabolic panel    Order Specific Question:   Has the patient fasted?    Answer:   Yes  . VITAMIN D 25 Hydroxy (Vit-D Deficiency, Fractures)  . CBC with Differential/Platelet  . TSH  . POCT urinalysis dipstick    Meds ordered this encounter  Medications  . doxepin (SINEQUAN) 10 MG capsule    Sig: Take 1-2 capsules (10-20 mg total) by mouth at bedtime as needed (sleep).    Dispense:  60 capsule    Refill:  3   Delman Cheadle, M.D.  Primary Care at [facility] [address] [place], [place] [address] [phone] phone [phone] fax  01/16/18 10:51 AM

## 2018-01-16 NOTE — Patient Instructions (Addendum)
   IF you received an x-ray today, you will receive an invoice from [facility]. Please contact [facility] at [phone] with questions or concerns regarding your invoice.   IF you received labwork today, you will receive an invoice from [facility]. Please contact [facility] at [phone] with questions or concerns regarding your invoice.   Our billing staff will not be able to assist you with questions regarding bills from these companies.  You will be contacted with the lab results as soon as they are available. The fastest way to get your results is to activate your My Chart account. Instructions are located on the last page of this paperwork. If you have not heard from us regarding the results in 2 weeks, please contact this office.      Osteoporosis Osteoporosis is the thinning and loss of density in the bones. Osteoporosis makes the bones more brittle, fragile, and likely to break (fracture). Over time, osteoporosis can cause the bones to become so weak that they fracture after a simple fall. The bones most likely to fracture are the bones in the hip, wrist, and spine. What are the causes? The exact cause is not known. What increases the risk? Anyone can develop osteoporosis. You may be at greater risk if you have a family history of the condition or have poor nutrition. You may also have a higher risk if you are:  Female.  50 years old or older.  A smoker.  Not physically active.  White or Asian.  Slender. What are the signs or symptoms? A fracture might be the first sign of the disease, especially if it results from a fall or injury that would not usually cause a bone to break. Other signs and symptoms include:  Low back and neck pain.  Stooped posture.  Height loss. How is this diagnosed? To make a diagnosis, your health care provider may:  Take a medical history.  Perform a physical exam.  Order tests, such as:  A bone mineral density  test.  A dual-energy X-ray absorptiometry test. How is this treated? The goal of osteoporosis treatment is to strengthen your bones to reduce your risk of a fracture. Treatment may involve:  Making lifestyle changes, such as:  Eating a diet rich in calcium.  Doing weight-bearing and muscle-strengthening exercises.  Stopping tobacco use.  Limiting alcohol intake.  Taking medicine to slow the process of bone loss or to increase bone density.  Monitoring your levels of calcium and vitamin D. Follow these instructions at home:  Include calcium and vitamin D in your diet. Calcium is important for bone health, and vitamin D helps the body absorb calcium.  Perform weight-bearing and muscle-strengthening exercises as directed by your health care provider.  Do not use any tobacco products, including cigarettes, chewing tobacco, and electronic cigarettes. If you need help quitting, ask your health care provider.  Limit your alcohol intake.  Take medicines only as directed by your health care provider.  Keep all follow-up visits as directed by your health care provider. This is important.  Take precautions at home to lower your risk of falling, such as:  Keeping rooms well lit and clutter free.  Installing safety rails on stairs.  Using rubber mats in the bathroom and other areas that are often wet or slippery. Get help right away if: You fall or injure yourself. This information is not intended to replace advice given to you by your health care provider. Make sure you discuss any questions you   discuss any questions you have with your health care provider. Document Released: 03/07/2005 Document Revised: 10/31/2015 Document Reviewed: 11/05/2013 Elsevier Interactive Patient Education  2018 [employer].  Calcium Intake Recommendations Calcium is a mineral that affects many functions in the body, including:  Blood clotting.  Blood vessel function.  Nerve impulse conduction.  Hormone  secretion.  Muscle contraction.  Bone and teeth functions.  Most of your body's calcium supply is stored in your bones and teeth. When your calcium stores are low, you may be at risk for low bone mass, bone loss, and bone fractures. Consuming enough calcium helps to grow healthy bones and teeth and to prevent breakdown over time. It is very important that you get enough calcium if you are:  A child undergoing rapid growth.  An adolescent girl.  A pre- or post-menopausal woman.  A woman whose menstrual cycle has stopped due to anorexia nervosa or regular intense exercise.  An individual with lactose intolerance or a milk allergy.  A vegetarian.  What is my plan? Try to consume the recommended amount of calcium daily based on your age. Depending on your overall health, your health care provider may recommend increased calcium intake.General daily calcium intake recommendations by age are:  Birth to 6 months: 200 mg.  Infants 7 to 12 months: 260 mg.  Children 1 to 3 years: 700 mg.  Children 4 to 8 years: 1,000 mg.  Children 9 to 13 years: 1,300 mg.  Teens 14 to 18 years: 1,300 mg.  Adults 19 to 50 years: 1,000 mg.  Adult women 51 to 70 years: 1,200 mg.  Adult men 51 to 70 years: 1,000 mg.  Adults 71 years and older: 1,200 mg.  Pregnant and breastfeeding teens: 1,300 mg.  Pregnant and breastfeeding adults: 1,000 mg.  What do I need to know about calcium intake?  In order for the body to absorb calcium, it needs vitamin D. You can get vitamin D through: ? Direct exposure of the skin to sunlight. ? Foods, such as egg yolks, liver, saltwater fish, and fortified milk. ? Supplements.  Consuming too much calcium may cause: ? Constipation. ? Decreased absorption of iron and zinc. ? Kidney stones.  Calcium supplements may interact with certain medicines. Check with your health care provider before starting any calcium supplements.  Try to get most of your calcium  from food. What foods can I eat? Grains  Fortified oatmeal. Fortified ready-to-eat cereals. Fortified frozen waffles. Vegetables Turnip greens. Broccoli. Fruits Fortified orange juice. Meats and Other Protein Sources Canned sardines with bones. Canned salmon with bones. Soy beans. Tofu. Baked beans. Almonds. [place] nuts. Sunflower seeds. Dairy Milk. Yogurt. Cheese. Cottage cheese. Beverages Fortified soy milk. Fortified rice milk. Sweets/Desserts Pudding. Ice Cream. Milkshakes. Blackstrap molasses. The items listed above may not be a complete list of recommended foods or beverages. Contact your dietitian for more options. What foods can affect my calcium intake? It may be more difficult for your body to use calcium or calcium may leave your body more quickly if you consume large amounts of:  Sodium.  Protein.  Caffeine.  Alcohol.  This information is not intended to replace advice given to you by your health care provider. Make sure you discuss any questions you have with your health care provider. Document Released: 01/10/2004 Document Revised: 12/16/2015 Document Reviewed: 11/03/2013 Elsevier Interactive Patient Education  2018 [place] protect organs, store calcium, and anchor muscles. Good health habits, such as eating nutritious foods  and exercising regularly, are important for maintaining healthy bones. They can also help to prevent a condition that causes bones to lose density and become weak and brittle (osteoporosis). Why is bone mass important? Bone mass refers to the amount of bone tissue that you have. The higher your bone mass, the stronger your bones. An important step toward having healthy bones throughout life is to have strong and dense bones during childhood. A young adult who has a high bone mass is more likely to have a high bone mass later in life. Bone mass at its greatest it is called peak bone mass. A large decline in bone mass  occurs in older adults. In women, it occurs about the time of menopause. During this time, it is important to practice good health habits, because if more bone is lost than what is replaced, the bones will become less healthy and more likely to break (fracture). If you find that you have a low bone mass, you may be able to prevent osteoporosis or further bone loss by changing your diet and lifestyle. How can I find out if my bone mass is low? Bone mass can be measured with an X-ray test that is called a bone mineral density (BMD) test. This test is recommended for all women who are age 90 or older. It may also be recommended for men who are age 58 or older, or for people who are more likely to develop osteoporosis due to:  Having bones that break easily.  Having a long-term disease that weakens bones, such as kidney disease or rheumatoid arthritis.  Having menopause earlier than normal.  Taking medicine that weakens bones, such as steroids, thyroid hormones, or hormone treatment for breast cancer or prostate cancer.  Smoking.  Drinking three or more alcoholic drinks each day.  What are the nutritional recommendations for healthy bones? To have healthy bones, you need to get enough of the right minerals and vitamins. Most nutrition experts recommend getting these nutrients from the foods that you eat. Nutritional recommendations vary from person to person. Ask your health care provider what is healthy for you. Here are some general guidelines. Calcium Recommendations Calcium is the most important (essential) mineral for bone health. Most people can get enough calcium from their diet, but supplements may be recommended for people who are at risk for osteoporosis. Good sources of calcium include:  Dairy products, such as low-fat or nonfat milk, cheese, and yogurt.  Dark green leafy vegetables, such as bok choy and broccoli.  Calcium-fortified foods, such as orange juice, cereal, bread, soy  beverages, and tofu products.  Nuts, such as almonds.  Follow these recommended amounts for daily calcium intake:  Children, age 65?3: 700 mg.  Children, age 65?8: 1,000 mg.  Children, age 55?13: 1,300 mg.  Teens, age 73?18: 1,300 mg.  Adults, age 39?50: 1,000 mg.  Adults, age 28?70: ? Men: 1,000 mg. ? Women: 1,200 mg.  Adults, age 28 or older: 1,200 mg.  Pregnant and breastfeeding females: ? Teens: 1,300 mg. ? Adults: 1,000 mg.  Vitamin D Recommendations Vitamin D is the most essential vitamin for bone health. It helps the body to absorb calcium. Sunlight stimulates the skin to make vitamin D, so be sure to get enough sunlight. If you live in a cold climate or you do not get outside often, your health care provider may recommend that you take vitamin D supplements. Good sources of vitamin D in your diet include:  Egg yolks.  Saltwater fish.  Milk and cereal fortified with vitamin D.  Follow these recommended amounts for daily vitamin D intake:  Children and teens, age 56?18: 51 international units.  Adults, age 56 or younger: 400-800 international units.  Adults, age 63 or older: 800-1,000 international units.  Other Nutrients Other nutrients for bone health include:  Phosphorus. This mineral is found in meat, poultry, dairy foods, nuts, and legumes. The recommended daily intake for adult men and adult women is 700 mg.  Magnesium. This mineral is found in seeds, nuts, dark green vegetables, and legumes. The recommended daily intake for adult men is 400?420 mg. For adult women, it is 310?320 mg.  Vitamin K. This vitamin is found in green leafy vegetables. The recommended daily intake is 120 mg for adult men and 90 mg for adult women.  What type of physical activity is best for building and maintaining healthy bones? Weight-bearing and strength-building activities are important for building and maintaining peak bone mass. Weight-bearing activities cause muscles and  bones to work against gravity. Strength-building activities increases muscle strength that supports bones. Weight-bearing and muscle-building activities include:  Walking and hiking.  Jogging and running.  Dancing.  Gym exercises.  Lifting weights.  Tennis and racquetball.  Climbing stairs.  Aerobics.  Adults should get at least 30 minutes of moderate physical activity on most days. Children should get at least 60 minutes of moderate physical activity on most days. Ask your health care provide what type of exercise is best for you. Where can I find more information? For more information, check out the following websites:  [place]: YardHomes.se  [employer] of Health: http://www.niams.AnonymousEar.fr.asp  This information is not intended to replace advice given to you by your health care provider. Make sure you discuss any questions you have with your health care provider. Document Released: 08/18/2003 Document Revised: 12/16/2015 Document Reviewed: 06/02/2014 Elsevier Interactive Patient Education  [employer].

## 2018-01-17 LAB — CBC WITH DIFFERENTIAL/PLATELET
BASOS: 0 %
Basophils Absolute: 0 10*3/uL (ref 0.0–0.2)
EOS (ABSOLUTE): 0.2 10*3/uL (ref 0.0–0.4)
EOS: 3 %
HEMATOCRIT: 41.2 % (ref 34.0–46.6)
HEMOGLOBIN: 13.2 g/dL (ref 11.1–15.9)
Immature Grans (Abs): 0 10*3/uL (ref 0.0–0.1)
Immature Granulocytes: 0 %
LYMPHS ABS: 1.8 10*3/uL (ref 0.7–3.1)
Lymphs: 38 %
MCH: 27.6 pg (ref 26.6–33.0)
MCHC: 32 g/dL (ref 31.5–35.7)
MCV: 86 fL (ref 79–97)
Monocytes Absolute: 0.3 10*3/uL (ref 0.1–0.9)
Monocytes: 6 %
NEUTROS ABS: 2.6 10*3/uL (ref 1.4–7.0)
Neutrophils: 53 %
Platelets: 201 10*3/uL (ref 150–450)
RBC: 4.79 x10E6/uL (ref 3.77–5.28)
RDW: 14.3 % (ref 12.3–15.4)
WBC: 4.9 10*3/uL (ref 3.4–10.8)

## 2018-01-17 LAB — COMPREHENSIVE METABOLIC PANEL
ALBUMIN: 4.1 g/dL (ref 3.6–4.8)
ALT: 13 IU/L (ref 0–32)
AST: 16 IU/L (ref 0–40)
Albumin/Globulin Ratio: 1.6 (ref 1.2–2.2)
Alkaline Phosphatase: 74 IU/L (ref 39–117)
BUN / CREAT RATIO: 10 — AB (ref 12–28)
BUN: 9 mg/dL (ref 8–27)
Bilirubin Total: 0.3 mg/dL (ref 0.0–1.2)
CALCIUM: 9.6 mg/dL (ref 8.7–10.3)
CO2: 26 mmol/L (ref 20–29)
Chloride: 102 mmol/L (ref 96–106)
Creatinine, Ser: 0.86 mg/dL (ref 0.57–1.00)
GFR, EST AFRICAN AMERICAN: 80 mL/min/{1.73_m2} (ref >59)
GFR, EST NON AFRICAN AMERICAN: 69 mL/min/{1.73_m2} (ref >59)
GLOBULIN, TOTAL: 2.5 g/dL (ref 1.5–4.5)
Glucose: 95 mg/dL (ref 65–99)
Potassium: 4.8 mmol/L (ref 3.5–5.2)
SODIUM: 141 mmol/L (ref 134–144)
Total Protein: 6.6 g/dL (ref 6.0–8.5)

## 2018-01-17 LAB — TSH: TSH: 0.639 u[IU]/mL (ref 0.450–4.500)

## 2018-01-17 LAB — LIPID PANEL
CHOLESTEROL TOTAL: 239 mg/dL — AB (ref 100–199)
Chol/HDL Ratio: 4.1 ratio (ref 0.0–4.4)
HDL: 59 mg/dL (ref >39)
LDL Calculated: 161 mg/dL — ABNORMAL HIGH (ref 0–99)
TRIGLYCERIDES: 93 mg/dL (ref 0–149)
VLDL Cholesterol Cal: 19 mg/dL (ref 5–40)

## 2018-01-17 LAB — VITAMIN D 25 HYDROXY (VIT D DEFICIENCY, FRACTURES): Vit D, 25-Hydroxy: 37.7 ng/mL (ref 30.0–100.0)

## 2018-01-24 DIAGNOSIS — R2689 Other abnormalities of gait and mobility: Secondary | ICD-10-CM | POA: Diagnosis not present

## 2018-01-24 DIAGNOSIS — S96812A Strain of other specified muscles and tendons at ankle and foot level, left foot, initial encounter: Secondary | ICD-10-CM | POA: Diagnosis not present

## 2018-01-24 DIAGNOSIS — M722 Plantar fascial fibromatosis: Secondary | ICD-10-CM | POA: Diagnosis not present

## 2018-01-24 DIAGNOSIS — M79672 Pain in left foot: Secondary | ICD-10-CM | POA: Diagnosis not present

## 2018-01-29 ENCOUNTER — Telehealth: Payer: Self-pay | Admitting: Family Medicine

## 2018-01-29 NOTE — Telephone Encounter (Signed)
Called to verify the Doxepin HCL 10 MG capsules. They will fax over results when they are ready.  Papers in [employer] 8.21.19

## 2018-01-30 ENCOUNTER — Telehealth: Payer: Self-pay | Admitting: Family Medicine

## 2018-01-30 NOTE — Telephone Encounter (Signed)
Spoke with patient to let her know that her medication(Doxepin) was approved by her insurance company.

## 2018-02-14 DIAGNOSIS — M722 Plantar fascial fibromatosis: Secondary | ICD-10-CM | POA: Diagnosis not present

## 2018-02-14 DIAGNOSIS — M79672 Pain in left foot: Secondary | ICD-10-CM | POA: Diagnosis not present

## 2018-03-03 DIAGNOSIS — H18603 Keratoconus, unspecified, bilateral: Secondary | ICD-10-CM | POA: Diagnosis not present

## 2018-03-03 DIAGNOSIS — H31093 Other chorioretinal scars, bilateral: Secondary | ICD-10-CM | POA: Diagnosis not present

## 2018-03-03 DIAGNOSIS — H2513 Age-related nuclear cataract, bilateral: Secondary | ICD-10-CM | POA: Diagnosis not present

## 2018-03-19 DIAGNOSIS — H18603 Keratoconus, unspecified, bilateral: Secondary | ICD-10-CM | POA: Diagnosis not present

## 2018-04-16 DIAGNOSIS — M5416 Radiculopathy, lumbar region: Secondary | ICD-10-CM | POA: Diagnosis not present

## 2018-04-16 DIAGNOSIS — M8580 Other specified disorders of bone density and structure, unspecified site: Secondary | ICD-10-CM | POA: Diagnosis not present

## 2018-04-25 ENCOUNTER — Other Ambulatory Visit: Payer: Self-pay | Admitting: Chiropractic Medicine

## 2018-04-25 DIAGNOSIS — M8580 Other specified disorders of bone density and structure, unspecified site: Secondary | ICD-10-CM

## 2018-06-23 ENCOUNTER — Ambulatory Visit: Payer: Self-pay

## 2018-06-23 DIAGNOSIS — R509 Fever, unspecified: Secondary | ICD-10-CM | POA: Diagnosis not present

## 2018-06-23 DIAGNOSIS — K0510 Chronic gingivitis, plaque induced: Secondary | ICD-10-CM | POA: Diagnosis not present

## 2018-06-23 NOTE — Telephone Encounter (Signed)
Pt c/o low grade fever to 99.2. Pt stated that she has a tooth that has been abscessed for 1 month. Pt stated she also has sinus congestion. Pt stated the fever started yesterday. Pt has been taking Ibuprofen for the fever. Pt doesn't have a dentist. Care advice given and pt verbalized understanding. Unable to find an appointment at [facility]. Advised pt to go to [facility]. Pt verbalized understanding.   Reason for Disposition . Fever    Pt doesn't have a dentist  Answer Assessment - Initial Assessment Questions 1. TEMPERATURE: "What is the most recent temperature?"  "How was it measured?"      99.2 oral thermometer 2. ONSET: "When did the fever start?"      yesterday 3. SYMPTOMS: "Do you have any other symptoms besides the fever?"  (e.g., colds, headache, sore throat, earache, cough, rash, diarrhea, vomiting, abdominal pain) Sinus congestion 4. CAUSE: If there are no symptoms, ask: "What do you think is causing the fever?"      Gum abcess 5. CONTACTS: "Does anyone else in the family have an infection?"     no 6. TREATMENT: "What have you done so far to treat this fever?" (e.g., medications)     ibuprofen 7. IMMUNOCOMPROMISE: "Do you have of the following: diabetes, HIV positive, splenectomy, cancer chemotherapy, chronic steroid treatment, transplant patient, etc."     no 8. PREGNANCY: "Is there any chance you are pregnant?" "When was your last menstrual period?"     n/a 9. TRAVEL: "Have you traveled out of the country in the last month?" (e.g., travel history, exposures)     no  Answer Assessment - Initial Assessment Questions 1. LOCATION: "Which tooth is hurting?"  (e.g., right-side/left-side, upper/lower, front/back)     Left upper side 2. ONSET: "When did the toothache start?"  (e.g., hours, days)      Not stated but pt stated that the tooth has had an abscess for 1 month 3. SEVERITY: "How bad is the toothache?"  (Scale 1-10; mild, moderate or severe)   - MILD (1-3): doesn't interfere with  chewing    - MODERATE (4-7): interferes with chewing, interferes with normal activities, awakens from sleep     - SEVERE (8-10): unable to eat, unable to do any normal activities, excruciating pain     4. SWELLING: "Is there any visible swelling of your face?"  5. OTHER SYMPTOMS: "Do you have any other symptoms?" (e.g., fever) 99.2 fever 6. PREGNANCY: "Is there any chance you are pregnant?" "When was your last menstrual period?" n/a  Protocols used: TOOTHACHE-A-AH, [facility]

## 2018-06-24 NOTE — Telephone Encounter (Signed)
Noted  

## 2018-08-18 DIAGNOSIS — E119 Type 2 diabetes mellitus without complications: Secondary | ICD-10-CM | POA: Diagnosis not present

## 2018-08-18 DIAGNOSIS — Z23 Encounter for immunization: Secondary | ICD-10-CM | POA: Diagnosis not present

## 2018-08-18 DIAGNOSIS — E559 Vitamin D deficiency, unspecified: Secondary | ICD-10-CM | POA: Diagnosis not present

## 2018-08-18 DIAGNOSIS — R5383 Other fatigue: Secondary | ICD-10-CM | POA: Diagnosis not present

## 2018-08-18 DIAGNOSIS — D508 Other iron deficiency anemias: Secondary | ICD-10-CM | POA: Diagnosis not present

## 2018-08-18 DIAGNOSIS — R634 Abnormal weight loss: Secondary | ICD-10-CM | POA: Diagnosis not present

## 2018-08-25 DIAGNOSIS — Z6824 Body mass index (BMI) 24.0-24.9, adult: Secondary | ICD-10-CM | POA: Diagnosis not present

## 2018-08-25 DIAGNOSIS — R000 Tachycardia, unspecified: Secondary | ICD-10-CM | POA: Diagnosis not present

## 2018-08-25 DIAGNOSIS — T730XXD Starvation, subsequent encounter: Secondary | ICD-10-CM | POA: Diagnosis not present

## 2018-11-06 DIAGNOSIS — Z20828 Contact with and (suspected) exposure to other viral communicable diseases: Secondary | ICD-10-CM | POA: Diagnosis not present

## 2018-12-16 DIAGNOSIS — Z1231 Encounter for screening mammogram for malignant neoplasm of breast: Secondary | ICD-10-CM | POA: Diagnosis not present

## 2018-12-16 DIAGNOSIS — M62830 Muscle spasm of back: Secondary | ICD-10-CM | POA: Diagnosis not present

## 2018-12-16 DIAGNOSIS — M25551 Pain in right hip: Secondary | ICD-10-CM | POA: Diagnosis not present

## 2018-12-17 DIAGNOSIS — G4700 Insomnia, unspecified: Secondary | ICD-10-CM | POA: Diagnosis not present

## 2018-12-17 DIAGNOSIS — M1611 Unilateral primary osteoarthritis, right hip: Secondary | ICD-10-CM | POA: Diagnosis not present

## 2018-12-17 DIAGNOSIS — M545 Low back pain: Secondary | ICD-10-CM | POA: Diagnosis not present

## 2019-01-26 DIAGNOSIS — E7800 Pure hypercholesterolemia, unspecified: Secondary | ICD-10-CM | POA: Diagnosis not present

## 2019-01-26 DIAGNOSIS — E559 Vitamin D deficiency, unspecified: Secondary | ICD-10-CM | POA: Diagnosis not present

## 2019-01-26 DIAGNOSIS — R5383 Other fatigue: Secondary | ICD-10-CM | POA: Diagnosis not present

## 2019-01-26 DIAGNOSIS — Z79899 Other long term (current) drug therapy: Secondary | ICD-10-CM | POA: Diagnosis not present

## 2019-01-26 DIAGNOSIS — Z780 Asymptomatic menopausal state: Secondary | ICD-10-CM | POA: Diagnosis not present

## 2019-01-26 DIAGNOSIS — R7303 Prediabetes: Secondary | ICD-10-CM | POA: Diagnosis not present

## 2019-01-26 DIAGNOSIS — E538 Deficiency of other specified B group vitamins: Secondary | ICD-10-CM | POA: Diagnosis not present

## 2019-01-26 DIAGNOSIS — Z1159 Encounter for screening for other viral diseases: Secondary | ICD-10-CM | POA: Diagnosis not present

## 2019-01-26 DIAGNOSIS — R0602 Shortness of breath: Secondary | ICD-10-CM | POA: Diagnosis not present

## 2019-01-26 DIAGNOSIS — Z0000 Encounter for general adult medical examination without abnormal findings: Secondary | ICD-10-CM | POA: Diagnosis not present

## 2019-01-31 DIAGNOSIS — I10 Essential (primary) hypertension: Secondary | ICD-10-CM | POA: Diagnosis not present

## 2019-02-03 ENCOUNTER — Other Ambulatory Visit: Payer: PPO

## 2019-02-09 DIAGNOSIS — R7303 Prediabetes: Secondary | ICD-10-CM | POA: Diagnosis not present

## 2019-02-09 DIAGNOSIS — I10 Essential (primary) hypertension: Secondary | ICD-10-CM | POA: Diagnosis not present

## 2019-02-09 DIAGNOSIS — E7800 Pure hypercholesterolemia, unspecified: Secondary | ICD-10-CM | POA: Diagnosis not present

## 2019-02-09 DIAGNOSIS — Z1159 Encounter for screening for other viral diseases: Secondary | ICD-10-CM | POA: Diagnosis not present

## 2019-02-09 DIAGNOSIS — M810 Age-related osteoporosis without current pathological fracture: Secondary | ICD-10-CM | POA: Diagnosis not present

## 2019-02-12 ENCOUNTER — Encounter (HOSPITAL_COMMUNITY): Payer: Self-pay | Admitting: *Deleted

## 2019-02-12 ENCOUNTER — Other Ambulatory Visit: Payer: Self-pay

## 2019-02-12 DIAGNOSIS — S52122A Displaced fracture of head of left radius, initial encounter for closed fracture: Secondary | ICD-10-CM | POA: Diagnosis not present

## 2019-02-12 DIAGNOSIS — S40022A Contusion of left upper arm, initial encounter: Secondary | ICD-10-CM | POA: Diagnosis not present

## 2019-02-12 DIAGNOSIS — M79641 Pain in right hand: Secondary | ICD-10-CM | POA: Diagnosis not present

## 2019-02-12 DIAGNOSIS — S52501A Unspecified fracture of the lower end of right radius, initial encounter for closed fracture: Secondary | ICD-10-CM | POA: Diagnosis not present

## 2019-02-12 NOTE — Progress Notes (Addendum)
Nicole Delgado denies chest pain or shortness of breath. PCP is Lucky Cowboy, PA- C at [facility], [address] , [place].Patient reported that she had a Covid test done today at [facility] and has been notified that it is negative.  I informed that she may have to have one for [facility], someone should call her in am.  I instructed patient to not eat after midnight, but can drink clear liquids until 0930, I went over what clear liquids are. I asked if someone can come by and pick up a pre- surgery drink tomorrow, she said yes.  I instructed patient to drink pre surgery drink between 0900- 0930.

## 2019-02-12 NOTE — Progress Notes (Signed)
   Do not eat after midnight.  Tou may have clear liquids until 0930.  Carbonated Beverages, Water, Clear Tea, Black Coffee only, Juice (non-citric and without pulp), Gatorade, Plain Jell-O  only, Plain Popsicles only.  Please drink the Pre- Surgery Ensure between 9:00AM - 9:30 am and nothing to drink after 9:30 AM.

## 2019-02-14 ENCOUNTER — Inpatient Hospital Stay (HOSPITAL_COMMUNITY): Payer: PPO | Admitting: Anesthesiology

## 2019-02-14 ENCOUNTER — Other Ambulatory Visit (HOSPITAL_COMMUNITY): Payer: Self-pay | Admitting: *Deleted

## 2019-02-14 ENCOUNTER — Observation Stay (HOSPITAL_COMMUNITY)
Admission: RE | Admit: 2019-02-14 | Discharge: 2019-02-16 | Disposition: A | Discharge status: Home / Self Care | Payer: PPO | Attending: Orthopedic Surgery | Admitting: Orthopedic Surgery

## 2019-02-14 ENCOUNTER — Encounter (HOSPITAL_COMMUNITY): Payer: Self-pay | Admitting: *Deleted

## 2019-02-14 ENCOUNTER — Encounter (HOSPITAL_COMMUNITY)
Admission: RE | Disposition: A | Discharge status: Home / Self Care | Payer: Self-pay | Source: Home / Self Care | Attending: Orthopedic Surgery

## 2019-02-14 ENCOUNTER — Other Ambulatory Visit (HOSPITAL_COMMUNITY)
Admission: RE | Admit: 2019-02-14 | Discharge: 2019-02-14 | Disposition: A | Discharge status: Home / Self Care | Payer: PPO | Source: Ambulatory Visit | Attending: Orthopedic Surgery | Admitting: Orthopedic Surgery

## 2019-02-14 DIAGNOSIS — S52122A Displaced fracture of head of left radius, initial encounter for closed fracture: Secondary | ICD-10-CM | POA: Insufficient documentation

## 2019-02-14 DIAGNOSIS — M19042 Primary osteoarthritis, left hand: Secondary | ICD-10-CM | POA: Insufficient documentation

## 2019-02-14 DIAGNOSIS — W19XXXA Unspecified fall, initial encounter: Secondary | ICD-10-CM | POA: Insufficient documentation

## 2019-02-14 DIAGNOSIS — Z20828 Contact with and (suspected) exposure to other viral communicable diseases: Secondary | ICD-10-CM | POA: Diagnosis not present

## 2019-02-14 DIAGNOSIS — Z888 Allergy status to other drugs, medicaments and biological substances status: Secondary | ICD-10-CM | POA: Diagnosis not present

## 2019-02-14 DIAGNOSIS — J45909 Unspecified asthma, uncomplicated: Secondary | ICD-10-CM | POA: Insufficient documentation

## 2019-02-14 DIAGNOSIS — Z79899 Other long term (current) drug therapy: Secondary | ICD-10-CM | POA: Diagnosis not present

## 2019-02-14 DIAGNOSIS — M19041 Primary osteoarthritis, right hand: Secondary | ICD-10-CM | POA: Insufficient documentation

## 2019-02-14 DIAGNOSIS — M810 Age-related osteoporosis without current pathological fracture: Secondary | ICD-10-CM | POA: Diagnosis not present

## 2019-02-14 DIAGNOSIS — S52121A Displaced fracture of head of right radius, initial encounter for closed fracture: Secondary | ICD-10-CM | POA: Diagnosis not present

## 2019-02-14 DIAGNOSIS — Z880 Allergy status to penicillin: Secondary | ICD-10-CM | POA: Diagnosis not present

## 2019-02-14 DIAGNOSIS — E785 Hyperlipidemia, unspecified: Secondary | ICD-10-CM | POA: Insufficient documentation

## 2019-02-14 DIAGNOSIS — G8918 Other acute postprocedural pain: Secondary | ICD-10-CM | POA: Diagnosis not present

## 2019-02-14 DIAGNOSIS — S52571A Other intraarticular fracture of lower end of right radius, initial encounter for closed fracture: Secondary | ICD-10-CM | POA: Diagnosis not present

## 2019-02-14 DIAGNOSIS — S52509A Unspecified fracture of the lower end of unspecified radius, initial encounter for closed fracture: Secondary | ICD-10-CM | POA: Diagnosis present

## 2019-02-14 HISTORY — DX: Unspecified osteoarthritis, unspecified site: M19.90

## 2019-02-14 HISTORY — DX: Prediabetes: R73.03

## 2019-02-14 HISTORY — PX: ORIF ELBOW FRACTURE: SHX5031

## 2019-02-14 HISTORY — PX: OPEN REDUCTION INTERNAL FIXATION (ORIF) DISTAL RADIAL FRACTURE: SHX5989

## 2019-02-14 LAB — BASIC METABOLIC PANEL
Anion gap: 13 (ref 5–15)
BUN: 12 mg/dL (ref 8–23)
CO2: 19 mmol/L — ABNORMAL LOW (ref 22–32)
Calcium: 8.9 mg/dL (ref 8.9–10.3)
Chloride: 105 mmol/L (ref 98–111)
Creatinine, Ser: 0.81 mg/dL (ref 0.44–1.00)
GFR calc Af Amer: >60 mL/min (ref >60)
GFR calc non Af Amer: >60 mL/min (ref >60)
Glucose, Bld: 141 mg/dL — ABNORMAL HIGH (ref 70–99)
Potassium: 4.6 mmol/L (ref 3.5–5.1)
Sodium: 137 mmol/L (ref 135–145)

## 2019-02-14 LAB — CBC
HCT: 40.6 % (ref 36.0–46.0)
Hemoglobin: 12.6 g/dL (ref 12.0–15.0)
MCH: 27.9 pg (ref 26.0–34.0)
MCHC: 31 g/dL (ref 30.0–36.0)
MCV: 89.8 fL (ref 80.0–100.0)
Platelets: 164 10*3/uL (ref 150–400)
RBC: 4.52 MIL/uL (ref 3.87–5.11)
RDW: 13 % (ref 11.5–15.5)
WBC: 6.6 10*3/uL (ref 4.0–10.5)
nRBC: 0 % (ref 0.0–0.2)

## 2019-02-14 LAB — SARS CORONAVIRUS 2 BY RT PCR (HOSPITAL ORDER, PERFORMED IN ~~LOC~~ HOSPITAL LAB): SARS Coronavirus 2: NEGATIVE

## 2019-02-14 SURGERY — OPEN REDUCTION INTERNAL FIXATION (ORIF) DISTAL RADIUS FRACTURE
Anesthesia: Regional | Site: Wrist | Laterality: Right

## 2019-02-14 MED ORDER — ALBUTEROL SULFATE (2.5 MG/3ML) 0.083% IN NEBU: 2.5000 mg | INHALATION_SOLUTION | Freq: Four times a day (QID) | RESPIRATORY_TRACT | Status: DC | PRN

## 2019-02-14 MED ORDER — FENTANYL CITRATE (PF) 100 MCG/2ML IJ SOLN
INTRAMUSCULAR | Status: AC
  Filled 2019-02-14: qty 2

## 2019-02-14 MED ORDER — OXYCODONE HCL 5 MG PO TABS
10.0000 mg | ORAL_TABLET | ORAL | Status: DC | PRN
  Administered 2019-02-14 – 2019-02-15 (×2): 10 mg via ORAL
  Administered 2019-02-16 (×2): 15 mg via ORAL
  Filled 2019-02-14 (×2): qty 3

## 2019-02-14 MED ORDER — ONDANSETRON HCL 4 MG/2ML IJ SOLN: 4.0000 mg | Freq: Once | INTRAMUSCULAR | Status: DC | PRN

## 2019-02-14 MED ORDER — CALCIUM CARBONATE 1250 (500 CA) MG PO TABS
1250.0000 mg | ORAL_TABLET | Freq: Every day | ORAL | Status: DC
  Administered 2019-02-15 – 2019-02-16 (×2): 1250 mg via ORAL
  Filled 2019-02-14 (×2): qty 1

## 2019-02-14 MED ORDER — BUPIVACAINE HCL (PF) 0.25 % IJ SOLN
INTRAMUSCULAR | Status: DC | PRN
  Administered 2019-02-14: 10 mL

## 2019-02-14 MED ORDER — KETOROLAC TROMETHAMINE 15 MG/ML IJ SOLN
15.0000 mg | Freq: Once | INTRAMUSCULAR | Status: AC | PRN
  Administered 2019-02-14: 15 mg via INTRAVENOUS

## 2019-02-14 MED ORDER — ONDANSETRON 4 MG PO TBDP
8.0000 mg | ORAL_TABLET | Freq: Three times a day (TID) | ORAL | Status: DC | PRN
  Administered 2019-02-16: 8 mg via ORAL
  Filled 2019-02-14: qty 2

## 2019-02-14 MED ORDER — CHLORHEXIDINE GLUCONATE 4 % EX LIQD: 60.0000 mL | Freq: Once | CUTANEOUS | Status: DC

## 2019-02-14 MED ORDER — DOCUSATE SODIUM 100 MG PO CAPS
100.0000 mg | ORAL_CAPSULE | Freq: Two times a day (BID) | ORAL | Status: DC
  Administered 2019-02-14 – 2019-02-16 (×4): 100 mg via ORAL
  Filled 2019-02-14 (×4): qty 1

## 2019-02-14 MED ORDER — KETOROLAC TROMETHAMINE 30 MG/ML IJ SOLN
INTRAMUSCULAR | Status: AC
  Filled 2019-02-14: qty 1

## 2019-02-14 MED ORDER — VANCOMYCIN HCL IN DEXTROSE 1-5 GM/200ML-% IV SOLN
1000.0000 mg | INTRAVENOUS | Status: AC
  Administered 2019-02-14: 12:00:00 1000 mg via INTRAVENOUS

## 2019-02-14 MED ORDER — LIDOCAINE 2% (20 MG/ML) 5 ML SYRINGE
INTRAMUSCULAR | Status: DC | PRN
  Administered 2019-02-14: 40 mg via INTRAVENOUS

## 2019-02-14 MED ORDER — PROPOFOL 10 MG/ML IV BOLUS
INTRAVENOUS | Status: DC | PRN
  Administered 2019-02-14: 200 mg via INTRAVENOUS

## 2019-02-14 MED ORDER — VANCOMYCIN HCL IN DEXTROSE 750-5 MG/150ML-% IV SOLN
750.0000 mg | Freq: Two times a day (BID) | INTRAVENOUS | Status: DC
  Administered 2019-02-14 – 2019-02-15 (×3): 750 mg via INTRAVENOUS
  Filled 2019-02-14 (×4): qty 150

## 2019-02-14 MED ORDER — FENTANYL CITRATE (PF) 100 MCG/2ML IJ SOLN
INTRAMUSCULAR | Status: DC | PRN
  Administered 2019-02-14: 25 ug via INTRAVENOUS
  Administered 2019-02-14: 50 ug via INTRAVENOUS

## 2019-02-14 MED ORDER — OXYCODONE HCL 5 MG PO TABS
5.0000 mg | ORAL_TABLET | ORAL | Status: DC | PRN
  Administered 2019-02-15 (×2): 5 mg via ORAL
  Filled 2019-02-14 (×3): qty 2

## 2019-02-14 MED ORDER — PROPOFOL 10 MG/ML IV BOLUS
INTRAVENOUS | Status: AC
  Filled 2019-02-14: qty 40

## 2019-02-14 MED ORDER — ACETAMINOPHEN 325 MG PO TABS
325.0000 mg | ORAL_TABLET | Freq: Four times a day (QID) | ORAL | Status: DC | PRN
  Administered 2019-02-16: 650 mg via ORAL
  Filled 2019-02-14: qty 2

## 2019-02-14 MED ORDER — ONDANSETRON HCL 4 MG/2ML IJ SOLN
INTRAMUSCULAR | Status: DC | PRN
  Administered 2019-02-14: 4 mg via INTRAVENOUS

## 2019-02-14 MED ORDER — FAMOTIDINE 20 MG PO TABS: 20.0000 mg | ORAL_TABLET | Freq: Two times a day (BID) | ORAL | Status: DC | PRN

## 2019-02-14 MED ORDER — ATORVASTATIN CALCIUM 40 MG PO TABS
40.0000 mg | ORAL_TABLET | Freq: Every day | ORAL | Status: DC
  Administered 2019-02-14 – 2019-02-16 (×3): 40 mg via ORAL
  Filled 2019-02-14 (×3): qty 1

## 2019-02-14 MED ORDER — LIDOCAINE 2% (20 MG/ML) 5 ML SYRINGE
INTRAMUSCULAR | Status: AC
  Filled 2019-02-14: qty 5

## 2019-02-14 MED ORDER — ACETAMINOPHEN 10 MG/ML IV SOLN: 1000.0000 mg | Freq: Once | INTRAVENOUS | Status: DC | PRN

## 2019-02-14 MED ORDER — FENTANYL CITRATE (PF) 100 MCG/2ML IJ SOLN
25.0000 ug | INTRAMUSCULAR | Status: DC | PRN
  Administered 2019-02-14: 25 ug via INTRAVENOUS

## 2019-02-14 MED ORDER — METHOCARBAMOL 500 MG PO TABS
500.0000 mg | ORAL_TABLET | Freq: Four times a day (QID) | ORAL | Status: DC | PRN
  Administered 2019-02-14 – 2019-02-16 (×4): 500 mg via ORAL
  Filled 2019-02-14 (×4): qty 1

## 2019-02-14 MED ORDER — LACTATED RINGERS IV SOLN
INTRAVENOUS | Status: DC | PRN
  Administered 2019-02-14 (×2): via INTRAVENOUS

## 2019-02-14 MED ORDER — PHENYLEPHRINE 40 MCG/ML (10ML) SYRINGE FOR IV PUSH (FOR BLOOD PRESSURE SUPPORT)
PREFILLED_SYRINGE | INTRAVENOUS | Status: DC | PRN
  Administered 2019-02-14 (×2): 120 ug via INTRAVENOUS
  Administered 2019-02-14: 80 ug via INTRAVENOUS

## 2019-02-14 MED ORDER — ADULT MULTIVITAMIN W/MINERALS CH
1.0000 | ORAL_TABLET | Freq: Every day | ORAL | Status: DC
  Administered 2019-02-15 – 2019-02-16 (×2): 1 via ORAL
  Filled 2019-02-14 (×2): qty 1

## 2019-02-14 MED ORDER — MULTIVITAMINS PO CAPS: 1.0000 | ORAL_CAPSULE | Freq: Every day | ORAL | Status: DC

## 2019-02-14 MED ORDER — 0.9 % SODIUM CHLORIDE (POUR BTL) OPTIME
TOPICAL | Status: DC | PRN
  Administered 2019-02-14: 1000 mL

## 2019-02-14 MED ORDER — DEXAMETHASONE SODIUM PHOSPHATE 10 MG/ML IJ SOLN
INTRAMUSCULAR | Status: DC | PRN
  Administered 2019-02-14: 4 mg via INTRAVENOUS

## 2019-02-14 MED ORDER — BUPIVACAINE HCL (PF) 0.25 % IJ SOLN
INTRAMUSCULAR | Status: AC
  Filled 2019-02-14: qty 30

## 2019-02-14 MED ORDER — PHENYLEPHRINE 40 MCG/ML (10ML) SYRINGE FOR IV PUSH (FOR BLOOD PRESSURE SUPPORT): PREFILLED_SYRINGE | INTRAVENOUS | Status: DC | PRN

## 2019-02-14 MED ORDER — VANCOMYCIN HCL IN DEXTROSE 1-5 GM/200ML-% IV SOLN
INTRAVENOUS | Status: AC
  Administered 2019-02-14: 1000 mg via INTRAVENOUS
  Filled 2019-02-14: qty 200

## 2019-02-14 MED ORDER — ROPIVACAINE HCL 5 MG/ML IJ SOLN
INTRAMUSCULAR | Status: DC | PRN
  Administered 2019-02-14: 30 mL via PERINEURAL

## 2019-02-14 MED ORDER — HYDROMORPHONE HCL 1 MG/ML IJ SOLN
0.5000 mg | INTRAMUSCULAR | Status: DC | PRN
  Administered 2019-02-15: 1 mg via INTRAVENOUS
  Filled 2019-02-14: qty 1

## 2019-02-14 MED ORDER — EPHEDRINE SULFATE 50 MG/ML IJ SOLN
INTRAMUSCULAR | Status: DC | PRN
  Administered 2019-02-14: 5 mg via INTRAVENOUS
  Administered 2019-02-14: 10 mg via INTRAVENOUS

## 2019-02-14 MED ORDER — ACETAMINOPHEN 500 MG PO TABS
ORAL_TABLET | ORAL | Status: AC
  Administered 2019-02-14: 1000 mg via ORAL
  Filled 2019-02-14: qty 2

## 2019-02-14 MED ORDER — LACTATED RINGERS IV SOLN: INTRAVENOUS | Status: DC

## 2019-02-14 MED ORDER — PROMETHAZINE HCL 12.5 MG RE SUPP: 12.5000 mg | Freq: Four times a day (QID) | RECTAL | Status: DC | PRN

## 2019-02-14 MED ORDER — ACETAMINOPHEN 500 MG PO TABS
1000.0000 mg | ORAL_TABLET | Freq: Four times a day (QID) | ORAL | Status: AC
  Administered 2019-02-14 – 2019-02-15 (×4): 1000 mg via ORAL
  Filled 2019-02-14 (×4): qty 2

## 2019-02-14 MED ORDER — VITAMIN D 25 MCG (1000 UNIT) PO TABS
1000.0000 [IU] | ORAL_TABLET | Freq: Every day | ORAL | Status: DC
  Administered 2019-02-15 – 2019-02-16 (×2): 1000 [IU] via ORAL
  Filled 2019-02-14 (×2): qty 1

## 2019-02-14 MED ORDER — ACETAMINOPHEN 500 MG PO TABS
1000.0000 mg | ORAL_TABLET | Freq: Once | ORAL | Status: AC
  Administered 2019-02-14: 12:00:00 1000 mg via ORAL

## 2019-02-14 SURGICAL SUPPLY — 61 items
BIT DRILL 1.8 CANN MAX VPC (BIT) ×4 IMPLANT
BIT DRILL 2.2 SS TIBIAL (BIT) ×4 IMPLANT
BLADE SURG 15 STRL LF DISP TIS (BLADE) ×2 IMPLANT
BLADE SURG 15 STRL SS (BLADE) ×2
BNDG COHESIVE 4X5 TAN STRL (GAUZE/BANDAGES/DRESSINGS) ×4 IMPLANT
BNDG ELASTIC 3X5.8 VLCR STR LF (GAUZE/BANDAGES/DRESSINGS) ×8 IMPLANT
BNDG ELASTIC 4X5.8 VLCR STR LF (GAUZE/BANDAGES/DRESSINGS) ×8 IMPLANT
BNDG ESMARK 4X9 LF (GAUZE/BANDAGES/DRESSINGS) ×4 IMPLANT
BNDG GAUZE ELAST 4 BULKY (GAUZE/BANDAGES/DRESSINGS) ×8 IMPLANT
CORD BIPOLAR FORCEPS 12FT (ELECTRODE) ×4 IMPLANT
COVER SURGICAL LIGHT HANDLE (MISCELLANEOUS) ×4 IMPLANT
CUFF TOURN SGL QUICK 18X4 (TOURNIQUET CUFF) ×8 IMPLANT
DECANTER SPIKE VIAL GLASS SM (MISCELLANEOUS) ×4 IMPLANT
DRAPE OEC MINIVIEW 54X84 (DRAPES) ×4 IMPLANT
DRSG ADAPTIC 3X8 NADH LF (GAUZE/BANDAGES/DRESSINGS) ×8 IMPLANT
GAUZE SPONGE 4X4 12PLY STRL (GAUZE/BANDAGES/DRESSINGS) ×8 IMPLANT
GAUZE XEROFORM 5X9 LF (GAUZE/BANDAGES/DRESSINGS) ×8 IMPLANT
GLOVE SS BIOGEL STRL SZ 8 (GLOVE) ×2 IMPLANT
GLOVE SUPERSENSE BIOGEL SZ 8 (GLOVE) ×2
GOWN STRL REUS W/ TWL LRG LVL3 (GOWN DISPOSABLE) ×2 IMPLANT
GOWN STRL REUS W/ TWL XL LVL3 (GOWN DISPOSABLE) ×2 IMPLANT
GOWN STRL REUS W/TWL LRG LVL3 (GOWN DISPOSABLE) ×2
GOWN STRL REUS W/TWL XL LVL3 (GOWN DISPOSABLE) ×2
K-WIRE COCR 0.9X95 (WIRE) ×4
KIT BASIN OR (CUSTOM PROCEDURE TRAY) ×4 IMPLANT
KIT TURNOVER KIT B (KITS) ×4 IMPLANT
KWIRE COCR 0.9X95 (WIRE) ×2 IMPLANT
NEEDLE HYPO 25GX1X1/2 BEV (NEEDLE) ×4 IMPLANT
NS IRRIG 1000ML POUR BTL (IV SOLUTION) ×8 IMPLANT
PACK ORTHO EXTREMITY (CUSTOM PROCEDURE TRAY) ×4 IMPLANT
PAD ARMBOARD 7.5X6 YLW CONV (MISCELLANEOUS) ×8 IMPLANT
PAD CAST 4YDX4 CTTN HI CHSV (CAST SUPPLIES) ×8 IMPLANT
PADDING CAST COTTON 4X4 STRL (CAST SUPPLIES) ×8
PEG LOCKING SMOOTH 2.2X16 (Screw) ×4 IMPLANT
PEG LOCKING SMOOTH 2.2X18 (Peg) ×8 IMPLANT
PEG LOCKING SMOOTH 2.2X20 (Screw) ×8 IMPLANT
PEG LOCKING SMOOTH 2.2X22 (Screw) ×8 IMPLANT
PLATE NARROW DVR RIGHT (Plate) ×4 IMPLANT
SCREW LOCK 12X2.7X 3 LD (Screw) ×4 IMPLANT
SCREW LOCK 16X2.7X 3 LD TPR (Screw) ×2 IMPLANT
SCREW LOCKING 2.7X12MM (Screw) ×4 IMPLANT
SCREW LOCKING 2.7X13MM (Screw) ×4 IMPLANT
SCREW LOCKING 2.7X15MM (Screw) ×4 IMPLANT
SCREW LOCKING 2.7X16 (Screw) ×2 IMPLANT
SCREW VPC 2.5X16MM (Screw) ×8 IMPLANT
SOL PREP POV-IOD 4OZ 10% (MISCELLANEOUS) ×16 IMPLANT
SPLINT FIBERGLASS 3X12 (CAST SUPPLIES) ×4 IMPLANT
SPLINT FIBERGLASS 4X30 (CAST SUPPLIES) ×4 IMPLANT
SPONGE LAP 4X18 RFD (DISPOSABLE) ×4 IMPLANT
SUT FIBERWIRE 3-0 18 TAPR NDL (SUTURE) ×4
SUT MNCRL AB 4-0 PS2 18 (SUTURE) ×4 IMPLANT
SUT PROLENE 3 0 PS 2 (SUTURE) ×4 IMPLANT
SUT PROLENE 4 0 P 3 18 (SUTURE) ×4 IMPLANT
SUTURE FIBERWR 3-0 18 TAPR NDL (SUTURE) ×2 IMPLANT
SYR CONTROL 10ML LL (SYRINGE) ×4 IMPLANT
SYSTEM CHEST DRAIN TLS 7FR (DRAIN) ×4 IMPLANT
TOWEL GREEN STERILE (TOWEL DISPOSABLE) ×8 IMPLANT
TOWEL GREEN STERILE FF (TOWEL DISPOSABLE) ×4 IMPLANT
TUBE EVACUATION TLS (MISCELLANEOUS) ×4 IMPLANT
UNDERPAD 30X30 (UNDERPADS AND DIAPERS) ×8 IMPLANT
WATER STERILE IRR 1000ML POUR (IV SOLUTION) ×4 IMPLANT

## 2019-02-14 NOTE — Anesthesia Procedure Notes (Signed)
Procedure Name: LMA Insertion °Performed by: Keryn Nessler H, CRNA °Pre-anesthesia Checklist: Patient identified, Emergency Drugs available, Suction available and Patient being monitored °Patient Re-evaluated:Patient Re-evaluated prior to induction °Oxygen Delivery Method: Circle System Utilized °Preoxygenation: Pre-oxygenation with 100% oxygen °Induction Type: IV induction °Ventilation: Mask ventilation without difficulty °LMA: LMA inserted °LMA Size: 4.0 °Number of attempts: 1 °Airway Equipment and Method: Bite block °Placement Confirmation: positive ETCO2 °Tube secured with: Tape °Dental Injury: Teeth and Oropharynx as per pre-operative assessment  ° ° ° ° ° ° °

## 2019-02-14 NOTE — Op Note (Signed)
Operative note February 14, 2019  Roseanne Kaufman MD  Preoperative diagnosis #1 right comminuted complex grade 3 part intra-articular distal radius fracture #2 left elbow radial head fracture displaced and articular #3 left ankle pain  Postop diagnosis: The same  Operative procedure: #1 right wrist/radius fracture distal ORIF with DVR plate and screw construct.  #2 4 view radiographic series right wrist.  #3 open reduction internal fixation left radial head intra-articular/head splitting fracture with [employer] headless screws #4 3 view radiographic series left elbow #5 fluoroscopic exam left ankle  Surgeon Roseanne Kaufman  Assistant none  Complications none  Tourniquet time less than an hour on the right side and less than an hour on the left side  Drains 1 on the right wrist  Indications for the procedure 68 year old female who presents the above-mentioned diagnosis.  This is a fall with displaced fractures.  She presents for definitive ORIF.  Operative procedure: Patient was seen by myself and anesthesia taken the operative theater and underwent smooth induction of general anesthetic.  Preoperatively a block was placed in the right upper extremity.  The right and left upper extremities were carefully pre-scrub with Hibiclens followed by a 10-minute surgical Betadine scrub and paint.  Outlined marks were made visually towels and drapes placed and final timeout was observed.  Preoperative antibiotics were given without difficulty.  At this time we addressed the left side first tourniquet was insufflated without difficulty a Coker incision was made dissection was carried down interval between the ECU and anconeus was created dissection was carried down capsule was split hematoma was evacuated and following this the intra-articular fracture about the radial head was then very carefully pushed back out into proper alignment converting the type II-III fracture to a nondisplaced fracture.  Two  81mm headless screws from [employer] were placed in standard fashion utilizing AO technique and there were no complicating features.  I had good smooth arc of motion no complications and thus took final copy x-rays.  This was a 3 view x-ray series performed examined and interpreted by myself.  Following this we irrigated copiously closed the capsule with 3-0 FiberWire and the interval about the fascia with 3-0 FiberWire.  Following this we irrigated copiously and with hemostasis obtained and tourniquet deflated closed wound with Prolene.  Long-arm splint was applied without difficulty after standard sterile dressing was applied at the conclusion of the case.  Soft compartments and smooth arc of motion was appreciated and the mechanical locking and catching was alleviated with ORIF.  I was pleased with this.  Following this attention was turned towards the wrist.  Insufflation of the tourniquet was accomplished.  Volar radial incision was made following this dissection was carried down and the patient then underwent a very careful and cautious approach to the ORIF.  FCR tendon sheath was incised carpal canal contents retracted ulnarly pronator incised and following this a very comminuted complex fracture was reduced and piecemeal back together with combination of orthopedic instruments to create the reduction followed by the reduction being held and plate applied.  This was a narrow DVR plate and screw construct.  I was able to achieve adequate height inclination and volar tilt to my satisfaction without difficulty.  The patient tolerated this nicely.  Once this was complete we then irrigated copiously closed the pronator took final copy x-rays which was a 4-5 view series of x-rays performed examined and interpreted by myself.  Smooth arc of motion about the radiocarpal midcarpal and distal radial ulnar joint  were appreciated on the exam and the pronator was closed followed by closure of the skin edge over TLS  drain.  The patient tolerated this well.  Once this was complete a short arm splint was applied  Following this we then performed a fluoroscopic examination of the left ankle there were no obvious fractures however we will get some plain film radiographs that are dedicated to this area.  The patient tolerated the procedure well.  She was transferred to recovery in stable condition.  Patient will be admitted for IV antibiotics general postop measures including pain management and other needs.  Should any problems occur she will notify us.  All questions have been encouraged and answered.  Ilina Xu MD

## 2019-02-14 NOTE — Anesthesia Postprocedure Evaluation (Signed)
Anesthesia Post Note  Patient: Nicole Delgado  Procedure(s) Performed: RIGHT OPEN REDUCTION INTERNAL FIXATION (ORIF) DISTAL RADIAL FRACTURE WITH REPAIR RECONSTRUCTION AS NECESSARY (Right Wrist) LEFT OPEN REDUCTION INTERNAL FIXATION (ORIF) ELBOW (Left Elbow)     Patient location during evaluation: PACU Anesthesia Type: Regional and General Level of consciousness: awake and alert Pain management: pain level controlled Vital Signs Assessment: post-procedure vital signs reviewed and stable Respiratory status: spontaneous breathing, nonlabored ventilation, respiratory function stable and patient connected to nasal cannula oxygen Cardiovascular status: blood pressure returned to baseline and stable Postop Assessment: no apparent nausea or vomiting Anesthetic complications: no    Last Vitals:  Vitals:   02/14/19 1700 02/14/19 1727  BP: (!) 175/82 (!) 175/55  Pulse: 69 62  Resp: 17 16  Temp: (!) 36.3 C 36.8 C  SpO2: 96% 100%    Last Pain:  Vitals:   02/14/19 1745  TempSrc:   PainSc: 0-No pain                 Ryan P Ellender

## 2019-02-14 NOTE — Progress Notes (Signed)
Left message on patients voicemail to come to [facility] for pre-procedure COVID testing prior to surgery today.  Left call back number

## 2019-02-14 NOTE — Anesthesia Procedure Notes (Signed)
Anesthesia Regional Block: Supraclavicular block   Pre-Anesthetic Checklist: ,, timeout performed, Correct Patient, Correct Site, Correct Laterality, Correct Procedure, Correct Position, site marked, Risks and benefits discussed,  Surgical consent,  Pre-op evaluation,  At surgeon's request and post-op pain management  Laterality: Right  Prep: chloraprep       Needles:  Injection technique: Single-shot  Needle Type: Echogenic Stimulator Needle     Needle Length: 9cm  Needle Gauge: 21     Additional Needles:   Procedures:,,,, ultrasound used (permanent image in chart),,,,  Narrative:  Start time: 02/14/2019 1:00 PM End time: 02/14/2019 1:10 PM Injection made incrementally with aspirations every 5 mL.  Performed by: Personally  Anesthesiologist: Murvin Natal, MD  Additional Notes: Functioning IV was confirmed and monitors were applied.  A timeout was performed. Sterile prep, hand hygiene and sterile gloves were used. A 15mm 21ga Arrow echogenic stimulator needle was used. Negative aspiration and negative test dose prior to incremental administration of local anesthetic. The patient tolerated the procedure well.  Ultrasound guidance: relevent anatomy identified, needle position confirmed, local anesthetic spread visualized around nerve(s), vascular puncture avoided.  Image printed for medical record.

## 2019-02-14 NOTE — H&P (Signed)
Nicole Delgado is an 68 y.o. female.   Chief Complaint: Right wrist fracture, left elbow fracture HPI: Patient fell 2 days ago.  She has displaced comminuted complex and articular right distal radius fracture and a displaced and articular left radial head fracture about the elbow.  She has mechanical symptoms of locking and catching on the left side.  We will plan for surgical avenues of care for both the right and left upper extremity.  Patient presents for evaluation and treatment of the of their upper extremity predicament. The patient denies neck, back, chest or  abdominal pain. The patient notes that they have no lower extremity problems. The patients primary complaint is noted. We are planning surgical care pathway for the upper extremity.  Past Medical History:  Diagnosis Date  . Allergy   . Arthritis    hands  . Asthma    seasonal  . Keratoconus    followed by optho at [facility], on retinal transplant list, legally blind  . Osteoporosis   . Pre-diabetes     Past Surgical History:  Procedure Laterality Date  . ABDOMINAL HYSTERECTOMY  1995  . BIOPSY BREAST Left 12/11/2017   states she was advised biopsy was benign  . BREAST EXCISIONAL BIOPSY Bilateral    [id number], 1973, 1990 benign  . COLONOSCOPY W/ POLYPECTOMY    . rotator cuff surgery Right     Family History  Problem Relation Age of Onset  . Alzheimer's disease Mother   . Heart Problems Father   . Heart disease Father   . Cancer Brother   . Hypertension Sister   . Colon cancer Neg Hx   . Esophageal cancer Neg Hx   . Liver cancer Neg Hx   . Pancreatic cancer Neg Hx   . Rectal cancer Neg Hx   . Stomach cancer Neg Hx    Social History:  reports that she has never smoked. She has never used smokeless tobacco. She reports that she does not drink alcohol or use drugs.  Allergies:  Allergies  Allergen Reactions  . Penicillins Anaphylaxis and Hives    Did it involve swelling of the face/tongue/throat, SOB, or low BP?  Yes Did it involve sudden or severe rash/hives, skin peeling, or any reaction on the inside of your mouth or nose? Yes Did you need to seek medical attention at a hospital or doctor's office? Yes When did it last happen?In her 80s If all above answers are "NO", may proceed with cephalosporin use.   . Bee Venom Swelling  . Valium [Diazepam] Other (See Comments)    Hallucinations    No medications prior to admission.    No results found for this or any previous visit (from the past 48 hour(s)). No results found.  Review of Systems  Respiratory: Negative.   Cardiovascular: Negative.     Height 5\' 6"  (1.676 m), weight 70.8 kg. Physical Exam  Displaced comminuted complex right distal radius fracture and left elbow radial head fracture.  She notes no locking popping catching in the lower extremities she notes no sensory loss.  The patient is alert and oriented in no acute distress. The patient complains of pain in the affected upper extremity.  The patient is noted to have a normal HEENT exam. Lung fields show equal chest expansion and no shortness of breath. Abdomen exam is nontender without distention. Lower extremity examination does not show any fracture dislocation or blood clot symptoms. Pelvis is stable and the neck and back are stable and  nontender. Assessment/Plan We are planning surgery for your upper extremity. The risk and benefits of surgery to include risk of bleeding, infection, anesthesia,  damage to normal structures and failure of the surgery to accomplish its intended goals of relieving symptoms and restoring function have been discussed in detail. With this in mind we plan to proceed. I have specifically discussed with the patient the pre-and postoperative regime and the dos and don'ts and risk and benefits in great detail. Risk and benefits of surgery also include risk of dystrophy(CRPS), chronic nerve pain, failure of the healing process to go onto completion  and other inherent risks of surgery The relavent the pathophysiology of the disease/injury process, as well as the alternatives for treatment and postoperative course of action has been discussed in great detail with the patient who desires to proceed.  We will do everything in our power to help you (the patient) restore function to the upper extremity. It is a pleasure to see this patient today. We will plan for surgical avenues of care in the form of ORIF right distal radius fracture with repair reconstruction is necessary.  We will also plan for left elbow ORIF radial head fracture with repair reconstruction is necessary possible arthroplasty if necessary.    [name], MD 02/14/2019, 7:58 AM

## 2019-02-14 NOTE — Transfer of Care (Signed)
Immediate Anesthesia Transfer of Care Note  Patient: DEHLIA FORDICE  Procedure(s) Performed: RIGHT OPEN REDUCTION INTERNAL FIXATION (ORIF) DISTAL RADIAL FRACTURE WITH REPAIR RECONSTRUCTION AS NECESSARY (Right Wrist) LEFT OPEN REDUCTION INTERNAL FIXATION (ORIF) ELBOW (Left Elbow)  Patient Location: PACU  Anesthesia Type:General  Level of Consciousness: awake, alert  and oriented  Airway & Oxygen Therapy: Patient Spontanous Breathing and Patient connected to nasal cannula oxygen  Post-op Assessment: Report given to RN and Post -op Vital signs reviewed and stable  Post vital signs: Reviewed and stable  Last Vitals:  Vitals Value Taken Time  BP 183/95 02/14/19 1628  Temp    Pulse 74 02/14/19 1628  Resp 15 02/14/19 1628  SpO2 100 % 02/14/19 1628  Vitals shown include unvalidated device data.  Last Pain:  Vitals:   02/14/19 1038  TempSrc:   PainSc: 6       Patients Stated Pain Goal: 5 ([id number])  Complications: No apparent anesthesia complications

## 2019-02-14 NOTE — Progress Notes (Signed)
Pharmacy Antibiotic Note  Nicole Delgado is a 69 y.o. female admitted on 02/14/2019 with wrist and elbow fractures s/p OR. Pharmacy has been consulted for vancomycin dosing for post-op surgery prophylaxis. Pt received vancomycin ~1200 in OR 1000mg  x1, CrCl ~60 ml/min.  Plan: Vancomycin 750mg  IV q12h Follow-up LOT needed  Height: 5\' 6"  (167.6 cm) Weight: 156 lb (70.8 kg) IBW/kg (Calculated) : 59.3  Temp (24hrs), Avg:97.9 F (36.6 C), Min:97 F (36.1 C), Max:99 F (37.2 C)  Recent Labs  Lab 02/14/19 1105  WBC 6.6    CrCl cannot be calculated (Patient's most recent lab result is older than the maximum 21 days allowed.).    Allergies  Allergen Reactions  . Penicillins Anaphylaxis and Hives    Did it involve swelling of the face/tongue/throat, SOB, or low BP? Yes Did it involve sudden or severe rash/hives, skin peeling, or any reaction on the inside of your mouth or nose? Yes Did you need to seek medical attention at a hospital or doctor's office? Yes When did it last happen?In her 54s If all above answers are "NO", may proceed with cephalosporin use.   . Bee Venom Swelling  . Valium [Diazepam] Other (See Comments)    Hallucinations      Arrie Senate, PharmD, BCPS Clinical Pharmacist [phone] Please check AMION for all [place] numbers 02/14/2019

## 2019-02-14 NOTE — Anesthesia Preprocedure Evaluation (Addendum)
Anesthesia Evaluation  Patient identified by MRN, date of birth, ID band Patient awake    Reviewed: Allergy & Precautions, NPO status , Patient's Chart, lab work & pertinent test results  Airway Mallampati: III  TM Distance: >3 FB Neck ROM: Full    Dental no notable dental hx.    Pulmonary asthma ,    Pulmonary exam normal breath sounds clear to auscultation       Cardiovascular negative cardio ROS Normal cardiovascular exam Rhythm:Regular Rate:Normal     Neuro/Psych Keratoconus Blind negative psych ROS   GI/Hepatic negative GI ROS, Neg liver ROS,   Endo/Other  negative endocrine ROS  Renal/GU negative Renal ROS     Musculoskeletal Motor and sensation intact to right hand   Abdominal   Peds  Hematology HLD   Anesthesia Other Findings Comminuted complex intra articular right distal radius fracture, left elbow fracture  Reproductive/Obstetrics                           Anesthesia Physical Anesthesia Plan  ASA: II  Anesthesia Plan: General and Regional   Post-op Pain Management: GA combined w/ Regional for post-op pain   Induction: Intravenous  PONV Risk Score and Plan: 3 and Ondansetron, Dexamethasone and Treatment may vary due to age or medical condition  Airway Management Planned: LMA  Additional Equipment:   Intra-op Plan:   Post-operative Plan: Extubation in OR  Informed Consent: I have reviewed the patients History and Physical, chart, labs and discussed the procedure including the risks, benefits and alternatives for the proposed anesthesia with the patient or authorized representative who has indicated his/her understanding and acceptance.     Dental advisory given  Plan Discussed with: CRNA  Anesthesia Plan Comments:        Anesthesia Quick Evaluation

## 2019-02-15 DIAGNOSIS — S52571A Other intraarticular fracture of lower end of right radius, initial encounter for closed fracture: Secondary | ICD-10-CM | POA: Diagnosis not present

## 2019-02-15 NOTE — Evaluation (Signed)
Physical Therapy Evaluation Patient Details Name: Nicole Delgado MRN: [id number] DOB: 1950/12/30 Today's Date: 02/15/2019   History of Present Illness  Pt is a 68 y/o female presenting after fall, found with displaced comminuted complex and articular right distal radius fracture and a displaced and articular left radial head fracture about the elbow.  S/P ORIF R distal radius fx, ORIF L elbow radial head fx. PMH: arthritis, osteoporosis.   Clinical Impression   Patient is s/p above surgery resulting in functional limitations due to the deficits listed below (see PT Problem List). Independent prior to admission; Presents with mild balance deficits today with her first bout of progressive ambulation postop;  Patient will benefit from skilled PT to increase their independence and safety with mobility to allow discharge to the venue listed below.       Follow Up Recommendations Follow surgeon's recommendation for DC plan and follow-up therapies(consider HHOT), and therefore also HHPT    Equipment Recommendations  None recommended by PT    Recommendations for Other Services       Precautions / Restrictions Precautions Precautions: Fall Required Braces or Orthoses: Splint/Cast Splint/Cast: post op splints to R wrist and L UE from proximal humerus to hand  Splint/Cast - Date Prophylactic Dressing Applied (if applicable): 123456 Restrictions Weight Bearing Restrictions: Yes RUE Weight Bearing: Non weight bearing RLE Weight Bearing: Non weight bearing Other Position/Activity Restrictions: keep UEs elevated      Mobility  Bed Mobility Overal bed mobility: Needs Assistance Bed Mobility: Supine to Sit     Supine to sit: Min guard Sit to supine: Min guard   General bed mobility comments: min guard for safety and balance, cueing for technique for NWB B UES  Transfers Overall transfer level: Needs assistance Equipment used: None Transfers: Sit to/from Stand Sit to Stand: Min  guard(without physical contact)         General transfer comment: min guard for balance, cueing for technique and to maintain NWB BUES  Ambulation/Gait Ambulation/Gait assistance: Min guard Gait Distance (Feet): 150 Feet Assistive device: None Gait Pattern/deviations: Step-through pattern;Drifts right/left     General Gait Details: Close minguard assist for safety due to unsteadiness with intial walking postop; Able to recover from small losses of balance with stepping strategy without physical assist, but close guard fo rsafety  Stairs            Wheelchair Mobility    Modified Rankin (Stroke Patients Only)       Balance Overall balance assessment: History of Falls;Needs assistance Sitting-balance support: No upper extremity supported;Feet supported Sitting balance-Leahy Scale: Good     Standing balance support: No upper extremity supported;During functional activity Standing balance-Leahy Scale: Fair Standing balance comment: min guard for safety, balance                              Pertinent Vitals/Pain Pain Assessment: Faces Faces Pain Scale: Hurts a little bit Pain Location: R wrist, L elbow  Pain Descriptors / Indicators: Discomfort;Operative site guarding Pain Intervention(s): Monitored during session    Home Living Family/patient expects to be discharged to:: Private residence Living Arrangements: Other (Comment)(sister, other family available ) Available Help at Discharge: Family;Available 24 hours/day Type of Home: House Home Access: Level entry     Home Layout: One level Home Equipment: Shower seat      Prior Function Level of Independence: Independent         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, forensic Dominance   Dominant Hand: Right    Extremity/Trunk Assessment   Upper Extremity Assessment Upper Extremity Assessment: Defer to OT evaluation RUE Deficits / Details: s/p ORIF wrist, able to move fingers, elbow and shoulder;  limited by weight of cast and splinted at wrist RUE: Unable to fully assess due to immobilization RUE Sensation: WNL RUE Coordination: decreased gross motor;decreased fine motor LUE Deficits / Details: s/p ORIF elbow, able to move fingers and shoulder (otherwise splinted)  LUE: Unable to fully assess due to immobilization LUE Sensation: WNL LUE Coordination: decreased fine motor;decreased gross motor    Lower Extremity Assessment Lower Extremity Assessment: Overall WFL for tasks assessed(mild balance deficits)    Cervical / Trunk Assessment Cervical / Trunk Assessment: Normal  Communication   Communication: No difficulties  Cognition Arousal/Alertness: Awake/alert Behavior During Therapy: WFL for tasks assessed/performed Overall Cognitive Status: Within Functional Limits for tasks assessed                                        General Comments General comments (skin integrity, edema, etc.): reviewed use of elevation and ice     Exercises General Exercises - Upper Extremity Shoulder Flexion: AAROM;Both;5 reps;Supine Elbow Flexion: AAROM;Right;5 reps;Supine Other Exercises Other Exercises: B finger ROM as tolerated    Assessment/Plan    PT Assessment Patient needs continued PT services  PT Problem List Decreased activity tolerance;Decreased balance;Decreased knowledge of precautions       PT Treatment Interventions DME instruction;Gait training;Stair training;Functional mobility training;Therapeutic activities;Therapeutic exercise;Balance training;Patient/family education    PT Goals (Current goals can be found in the Care Plan section)  Acute Rehab PT Goals Patient Stated Goal: to get home  PT Goal Formulation: With patient Time For Goal Achievement: 03/01/19 Potential to Achieve Goals: Good    Frequency Min 5X/week   Barriers to discharge        Co-evaluation               AM-PAC PT "6 Clicks" Mobility  Outcome Measure Help needed  turning from your back to your side while in a flat bed without using bedrails?: A Little Help needed moving from lying on your back to sitting on the side of a flat bed without using bedrails?: A Little Help needed moving to and from a bed to a chair (including a wheelchair)?: A Little Help needed standing up from a chair using your arms (e.g., wheelchair or bedside chair)?: None Help needed to walk in hospital room?: None Help needed climbing 3-5 steps with a railing? : A Little 6 Click Score: 20    End of Session Equipment Utilized During Treatment: Gait belt Activity Tolerance: Patient tolerated treatment well Patient left: in chair;with call bell/phone within reach;with nursing/sitter in room Nurse Communication: Mobility status;Other (comment)(need for monitor box for chair alarm) PT Visit Diagnosis: Unsteadiness on feet (R26.81);Difficulty in walking, not elsewhere classified (R26.2)    Time: [id number] PT Time Calculation (min) (ACUTE ONLY): 23 min   Charges:   PT Evaluation $PT Eval Moderate Complexity: 1 Mod PT Treatments $Gait Training: 8-22 mins        Roney Marion, PT  Acute Rehabilitation Services Pager [phone] Office [place] 02/15/2019, 10:04 AM

## 2019-02-15 NOTE — Plan of Care (Signed)
  Problem: Activity: Goal: Risk for activity intolerance will decrease Outcome: Progressing   

## 2019-02-15 NOTE — Progress Notes (Signed)
Patient ID: Nicole Delgado, female   DOB: 01/22/1951, 68 y.o.   MRN: [id number] Patient is alert and oriented.  Patient looks very well.  Block is worn off and she is moving her fingers beautifully.  Vital signs stable.  She is tolerating a regular diet.  At present juncture we will plan for PT for ambulation and mobility control.  She can place some weight on her right elbow but I would prefer the right wrist and left elbow be completely nonweightbearing.  Her foot is doing reasonably well as is her ankle on the left side.  Chest is clear.  Abdomen is nontender.  We will continue antibiotics today and DC home tomorrow after therapy makes final recommendations.  Overall she looks great after a big operation yesterday.  [name] MD

## 2019-02-15 NOTE — Plan of Care (Signed)
  Problem: Activity: Goal: Risk for activity intolerance will decrease Outcome: Progressing   Problem: Pain Managment: Goal: General experience of comfort will improve Outcome: Progressing   Problem: Safety: Goal: Ability to remain free from injury will improve Outcome: Progressing   

## 2019-02-15 NOTE — Progress Notes (Signed)
Notified Dr. Vanetta Shawl office for the on call Dr that the TLS drain from the wound came out. There is no drainage on the patient's dressing. Will continue to monitor.

## 2019-02-15 NOTE — Evaluation (Signed)
Occupational Therapy Evaluation Patient Details Name: Nicole Delgado MRN: [id number] DOB: 1951/02/06 Today's Date: 02/15/2019    History of Present Illness Pt is a 68 y/o female presenting after fall, found with displaced comminuted complex and articular right distal radius fracture and a displaced and articular left radial head fracture about the elbow.  S/P ORIF R distal radius fx, ORIF L elbow radial head fx. PMH: arthritis, osteoporosis.    Clinical Impression   PTA patient independent. Admitted for above and limited by problem list below, including decreased functional use of B UEs, pain, impaired balance.  She currently requires min guard for transfers, mod assist for grooming, max assist for UB/LB ADLs.  She was educated on compensatory techniques for ADLs, UE elevation, ice and exercises, safety and recommendations. She will have 24/7 support from her sister and other family members as needed, who plan to assist with ADLs due to UE restrictions. She will benefit from continued OT services while admitted in order to optimize independence and safety with ADLs, mobility. Recommend to follow surgeon's recommendations for follow up therapy.     Follow Up Recommendations  Supervision/Assistance - 24 hour;Follow surgeon's recommendation for DC plan and follow-up therapies    Equipment Recommendations  3 in 1 bedside commode    Recommendations for Other Services PT consult     Precautions / Restrictions Precautions Precautions: Fall Required Braces or Orthoses: Splint/Cast Splint/Cast: post op splints to R wrist and L UE from proximal humerus to hand  Splint/Cast - Date Prophylactic Dressing Applied (if applicable): 123456 Restrictions Weight Bearing Restrictions: Yes RUE Weight Bearing: Non weight bearing RLE Weight Bearing: Non weight bearing Other Position/Activity Restrictions: keep UEs elevated      Mobility Bed Mobility Overal bed mobility: Needs Assistance Bed Mobility:  Supine to Sit;Sit to Supine     Supine to sit: Min guard Sit to supine: Min guard   General bed mobility comments: min guard for safety and balance, cueing for technique for NWB B UES  Transfers Overall transfer level: Needs assistance Equipment used: None Transfers: Sit to/from Stand Sit to Stand: Min guard         General transfer comment: min guard for balance, cueing for technique and to maintain NWB BUES    Balance Overall balance assessment: History of Falls;Needs assistance Sitting-balance support: No upper extremity supported;Feet supported Sitting balance-Leahy Scale: Good     Standing balance support: No upper extremity supported;During functional activity Standing balance-Leahy Scale: Fair Standing balance comment: min guard for safety, balance                            ADL either performed or assessed with clinical judgement   ADL Overall ADL's : Needs assistance/impaired Eating/Feeding: Minimal assistance;Sitting   Grooming: Moderate assistance;Sitting   Upper Body Bathing: Maximal assistance;Cueing for UE precautions;Sitting;Cueing for compensatory techniques   Lower Body Bathing: Maximal assistance;Sit to/from stand   Upper Body Dressing : Maximal assistance;Sitting   Lower Body Dressing: Maximal assistance;Sit to/from stand   Toilet Transfer: Min guard;Ambulation;BSC   Toileting- Clothing Manipulation and Hygiene: Maximal assistance;Sit to/from stand       Functional mobility during ADLs: Min guard General ADL Comments: patient limited decreased functional use of BUEs, reviewed compensatory techniques with ADLs (wearing slip on shoes, gowns only)      Vision Baseline Vision/History: Wears glasses(contacts ) Wears Glasses: At all times Patient Visual Report: No change from baseline(but doesnt have contacts with her )  Additional Comments: reports blurry but this is normal, needs her contacts     Perception     Praxis       Pertinent Vitals/Pain Pain Assessment: Faces Faces Pain Scale: Hurts little more Pain Location: R wrist, L elbow  Pain Descriptors / Indicators: Discomfort;Operative site guarding Pain Intervention(s): Monitored during session;Repositioned(elevated UEs)     Hand Dominance Right   Extremity/Trunk Assessment Upper Extremity Assessment Upper Extremity Assessment: RUE deficits/detail;LUE deficits/detail RUE Deficits / Details: s/p ORIF wrist, able to move fingers, elbow and shoulder; limited by weight of cast and splinted at wrist RUE: Unable to fully assess due to immobilization RUE Sensation: WNL RUE Coordination: decreased gross motor;decreased fine motor LUE Deficits / Details: s/p ORIF elbow, able to move fingers and shoulder (otherwise splinted)  LUE: Unable to fully assess due to immobilization LUE Sensation: WNL LUE Coordination: decreased fine motor;decreased gross motor   Lower Extremity Assessment Lower Extremity Assessment: Overall WFL for tasks assessed   Cervical / Trunk Assessment Cervical / Trunk Assessment: Normal   Communication Communication Communication: No difficulties   Cognition Arousal/Alertness: Awake/alert Behavior During Therapy: WFL for tasks assessed/performed Overall Cognitive Status: Within Functional Limits for tasks assessed                                     General Comments  reviewed use of elevation and ice     Exercises Exercises: General Upper Extremity;Other exercises General Exercises - Upper Extremity Shoulder Flexion: AAROM;Both;5 reps;Supine Elbow Flexion: AAROM;Right;5 reps;Supine Other Exercises Other Exercises: B finger ROM as tolerated    Shoulder Instructions      Home Living Family/patient expects to be discharged to:: Private residence Living Arrangements: Other (Comment)(sister, other family available ) Available Help at Discharge: Family;Available 24 hours/day Type of Home: House Home Access:  Level entry     Home Layout: One level     Bathroom Shower/Tub: Teacher, early years/pre: Standard     Home Equipment: Shower seat          Prior Functioning/Environment Level of Independence: Independent                 OT Problem List: Decreased strength;Decreased activity tolerance;Impaired balance (sitting and/or standing);Decreased knowledge of use of DME or AE;Decreased knowledge of precautions;Pain;Impaired UE functional use      OT Treatment/Interventions: Self-care/ADL training;DME and/or AE instruction;Therapeutic exercise;Balance training;Patient/family education;Therapeutic activities    OT Goals(Current goals can be found in the care plan section) Acute Rehab OT Goals Patient Stated Goal: to get home  OT Goal Formulation: With patient Time For Goal Achievement: 03/01/19 Potential to Achieve Goals: Good  OT Frequency: Min 2X/week   Barriers to D/C:            Co-evaluation              AM-PAC OT "6 Clicks" Daily Activity     Outcome Measure Help from another person eating meals?: A Little Help from another person taking care of personal grooming?: A Lot Help from another person toileting, which includes using toliet, bedpan, or urinal?: A Lot Help from another person bathing (including washing, rinsing, drying)?: A Lot Help from another person to put on and taking off regular upper body clothing?: A Lot Help from another person to put on and taking off regular lower body clothing?: A Lot 6 Click Score: 13   End of Session Nurse Communication:  Mobility status;Precautions  Activity Tolerance: Patient tolerated treatment well Patient left: in bed;with call bell/phone within reach;with bed alarm set  OT Visit Diagnosis: Unsteadiness on feet (R26.81);History of falling (Z91.81);Pain Pain - Right/Left: (bil) Pain - part of body: Arm                Time: [id number] OT Time Calculation (min): 24 min Charges:  OT General Charges $OT  Visit: 1 Visit OT Evaluation $OT Eval Moderate Complexity: 1 Mod OT Treatments $Self Care/Home Management : 8-22 mins  Delight Stare, OT Acute Rehabilitation Services Pager [phone] Office [phone]   Delight Stare 02/15/2019, 9:03 AM

## 2019-02-15 NOTE — Care Management Obs Status (Signed)
[facility] NOTIFICATION   Patient Details  Name: Nicole Delgado MRN: [id number] Date of Birth: [birth year]   Medicare Observation Status Notification Given:  Yes    Claudie Leach, RN 02/15/2019, 4:48 PM

## 2019-02-16 DIAGNOSIS — S52571A Other intraarticular fracture of lower end of right radius, initial encounter for closed fracture: Secondary | ICD-10-CM | POA: Diagnosis not present

## 2019-02-16 MED ORDER — OXYCODONE HCL 5 MG PO TABS: 5.0000 mg | ORAL_TABLET | ORAL | 0 refills | Status: DC | PRN

## 2019-02-16 NOTE — Progress Notes (Signed)
Spoke with Abigail Butts, [place], 3 in 1 will be delivered to home due to holiday today.

## 2019-02-16 NOTE — Progress Notes (Signed)
Patient ID: Nicole Delgado, female   DOB: 21-Dec-1950, 68 y.o.   MRN: [id number] Patient is stable on exam.  I went over all discharge instructions with the patient.  She has her medicines and I will refill a additional oxycodone pain prescription.  We will see her in 12 to 14 days.  All looks ready.  She is stable alert and oriented.  We are quite pleased with her progress.  Despite some very significant injuries she is doing well early on.  No signs of DVT infection or UTI.  Discharge medicines oxycodone and RTC in 12 to 14 days.  Is been a pleasure to see and treat her.  Kendle Turbin MD

## 2019-02-16 NOTE — Discharge Summary (Signed)
Physician Discharge Summary  Patient ID: Nicole Delgado MRN: [id number] DOB/AGE: [birth year] 68 y.o.  Admit date: 02/14/2019 Discharge date:   Admission Diagnoses: Comminuted complex intra articular right distal radius fracture, left elbow fracture Past Medical History:  Diagnosis Date  . Allergy   . Arthritis    hands  . Asthma    seasonal  . Keratoconus    followed by optho at [facility], on retinal transplant list, legally blind  . Osteoporosis   . Pre-diabetes     Discharge Diagnoses:  Active Problems:   Radius distal fracture   Surgeries: Procedure(s): RIGHT OPEN REDUCTION INTERNAL FIXATION (ORIF) DISTAL RADIAL FRACTURE WITH REPAIR RECONSTRUCTION AS NECESSARY LEFT OPEN REDUCTION INTERNAL FIXATION (ORIF) ELBOW on 02/14/2019    Consultants:   Discharged Condition: Improved  Hospital Course: Nicole Delgado is an 67 y.o. female who was admitted 02/14/2019 with a chief complaint of No chief complaint on file. , and found to have a diagnosis of Comminuted complex intra articular right distal radius fracture, left elbow fracture.  They were brought to the operating room on 02/14/2019 and underwent Procedure(s): RIGHT OPEN REDUCTION INTERNAL FIXATION (ORIF) DISTAL RADIAL FRACTURE WITH REPAIR RECONSTRUCTION AS NECESSARY LEFT OPEN REDUCTION INTERNAL FIXATION (ORIF) ELBOW.    They were given perioperative antibiotics:  Anti-infectives (From admission, onward)   Start     Dose/Rate Route Frequency Ordered Stop   02/14/19 2330  vancomycin (VANCOCIN) IVPB 750 mg/150 ml premix     750 mg 150 mL/hr over 60 Minutes Intravenous Every 12 hours 02/14/19 2005     02/14/19 1030  vancomycin (VANCOCIN) IVPB 1000 mg/200 mL premix     1,000 mg 200 mL/hr over 60 Minutes Intravenous On call to O.R. 02/14/19 1022 02/14/19 1249    .  They were given sequential compression devices, early ambulation, and Other (comment) for DVT prophylaxis.  Recent vital signs:  Patient Vitals for the past 24 hrs:  BP  Temp Temp src Pulse Resp SpO2  02/16/19 0759 (!) 181/79 98.9 F (37.2 C) Oral 85 18 100 %  02/16/19 0553 (!) 158/84 99.2 F (37.3 C) Oral 83 - 90 %  02/15/19 2057 (!) 182/72 99 F (37.2 C) Oral 87 - 97 %  02/15/19 1705 (!) 160/57 98.9 F (37.2 C) Oral 85 16 97 %  02/15/19 1244 (!) 173/69 98.4 F (36.9 C) Oral 80 16 97 %  .  Recent laboratory studies: No results found.  Discharge Medications:   Allergies as of 02/16/2019      Reactions   Penicillins Anaphylaxis, Hives   Did it involve swelling of the face/tongue/throat, SOB, or low BP? Yes Did it involve sudden or severe rash/hives, skin peeling, or any reaction on the inside of your mouth or nose? Yes Did you need to seek medical attention at a hospital or doctor's office? Yes When did it last happen?In her 55s If all above answers are "NO", may proceed with cephalosporin use.   Bee Venom Swelling   Valium [diazepam] Other (See Comments)   Hallucinations      Medication List    TAKE these medications   albuterol 108 (90 Base) MCG/ACT inhaler Commonly known as: VENTOLIN HFA Inhale 2 puffs into the lungs every 6 (six) hours as needed for wheezing or shortness of breath.   atorvastatin 40 MG tablet Commonly known as: LIPITOR Take 40 mg by mouth daily.   Calcium 600 600 MG Tabs tablet Generic drug: calcium carbonate Take 1,200 mg by mouth daily with  breakfast.   cholecalciferol 1000 units tablet Commonly known as: VITAMIN D Take 1,000 Units by mouth daily.   methocarbamol 500 MG tablet Commonly known as: ROBAXIN Take 500 mg by mouth every 6 (six) hours as needed for muscle spasms.   multivitamin capsule Take 1 capsule by mouth daily.   ondansetron 8 MG disintegrating tablet Commonly known as: ZOFRAN-ODT Take 8 mg by mouth every 8 (eight) hours as needed for nausea or vomiting.   oxyCODONE 5 MG immediate release tablet Commonly known as: Oxy IR/ROXICODONE Take 5 mg by mouth every 4 (four) hours as needed  for severe pain. What changed: Another medication with the same name was added. Make sure you understand how and when to take each.   oxyCODONE 5 MG immediate release tablet Commonly known as: Oxy IR/ROXICODONE Take 1-2 tablets (5-10 mg total) by mouth every 4 (four) hours as needed for moderate pain (pain score 4-6). What changed: You were already taking a medication with the same name, and this prescription was added. Make sure you understand how and when to take each.   traZODone 50 MG tablet Commonly known as: DESYREL Take 50 mg by mouth at bedtime as needed for sleep.       Diagnostic Studies: No results found.  They benefited maximally from their hospital stay and there were no complications.     Disposition: Discharge disposition: 01-Home or Self Care      Discharge Instructions    Call MD / Call 911   Complete by: As directed    If you experience chest pain or shortness of breath, CALL 911 and be transported to the hospital emergency room.  If you develope a fever above 101 F, pus (white drainage) or increased drainage or redness at the wound, or calf pain, call your surgeon's office.   Constipation Prevention   Complete by: As directed    Drink plenty of fluids.  Prune juice may be helpful.  You may use a stool softener, such as Colace (over the counter) 100 mg twice a day.  Use MiraLax (over the counter) for constipation as needed.   Diet - low sodium heart healthy   Complete by: As directed    Increase activity slowly as tolerated   Complete by: As directed      Follow-up Information    Roseanne Kaufman, MD Follow up in 12 day(s).   Specialty: Orthopedic Surgery Why: We will call for your follow-up to be seen in 12 to 14 days Contact information: [address] [place] [address] [id number]            Signed: [name] 02/16/2019, 10:30 AM  Physician Discharge Summary  Patient ID: Nicole Delgado MRN:  [id number] DOB/AGE: [birth year] 68 y.o.  Admit date: 02/14/2019 Discharge date:   Admission Diagnoses: Comminuted complex intra articular right distal radius fracture, left elbow fracture Past Medical History:  Diagnosis Date  . Allergy   . Arthritis    hands  . Asthma    seasonal  . Keratoconus    followed by optho at [facility], on retinal transplant list, legally blind  . Osteoporosis   . Pre-diabetes     Discharge Diagnoses:  Active Problems:   Radius distal fracture   Surgeries: Procedure(s): RIGHT OPEN REDUCTION INTERNAL FIXATION (ORIF) DISTAL RADIAL FRACTURE WITH REPAIR RECONSTRUCTION AS NECESSARY LEFT OPEN REDUCTION INTERNAL FIXATION (ORIF) ELBOW on 02/14/2019    Consultants:   Discharged Condition: Improved  Hospital Course: Nicole Longs  Delgado is an 68 y.o. female who was admitted 02/14/2019 with a chief complaint of No chief complaint on file. , and found to have a diagnosis of Comminuted complex intra articular right distal radius fracture, left elbow fracture.  They were brought to the operating room on 02/14/2019 and underwent Procedure(s): RIGHT OPEN REDUCTION INTERNAL FIXATION (ORIF) DISTAL RADIAL FRACTURE WITH REPAIR RECONSTRUCTION AS NECESSARY LEFT OPEN REDUCTION INTERNAL FIXATION (ORIF) ELBOW.    They were given perioperative antibiotics:  Anti-infectives (From admission, onward)   Start     Dose/Rate Route Frequency Ordered Stop   02/14/19 2330  vancomycin (VANCOCIN) IVPB 750 mg/150 ml premix     750 mg 150 mL/hr over 60 Minutes Intravenous Every 12 hours 02/14/19 2005     02/14/19 1030  vancomycin (VANCOCIN) IVPB 1000 mg/200 mL premix     1,000 mg 200 mL/hr over 60 Minutes Intravenous On call to O.R. 02/14/19 1022 02/14/19 1249    .  They were given sequential compression devices, early ambulation, and Other (comment) for DVT prophylaxis.  Recent vital signs:  Patient Vitals for the past 24 hrs:  BP Temp Temp src Pulse Resp SpO2  02/16/19 0759 (!) 181/79 98.9 F  (37.2 C) Oral 85 18 100 %  02/16/19 0553 (!) 158/84 99.2 F (37.3 C) Oral 83 - 90 %  02/15/19 2057 (!) 182/72 99 F (37.2 C) Oral 87 - 97 %  02/15/19 1705 (!) 160/57 98.9 F (37.2 C) Oral 85 16 97 %  02/15/19 1244 (!) 173/69 98.4 F (36.9 C) Oral 80 16 97 %  .  Recent laboratory studies: No results found.  Discharge Medications:   Allergies as of 02/16/2019      Reactions   Penicillins Anaphylaxis, Hives   Did it involve swelling of the face/tongue/throat, SOB, or low BP? Yes Did it involve sudden or severe rash/hives, skin peeling, or any reaction on the inside of your mouth or nose? Yes Did you need to seek medical attention at a hospital or doctor's office? Yes When did it last happen?In her 90s If all above answers are "NO", may proceed with cephalosporin use.   Bee Venom Swelling   Valium [diazepam] Other (See Comments)   Hallucinations      Medication List    TAKE these medications   albuterol 108 (90 Base) MCG/ACT inhaler Commonly known as: VENTOLIN HFA Inhale 2 puffs into the lungs every 6 (six) hours as needed for wheezing or shortness of breath.   atorvastatin 40 MG tablet Commonly known as: LIPITOR Take 40 mg by mouth daily.   Calcium 600 600 MG Tabs tablet Generic drug: calcium carbonate Take 1,200 mg by mouth daily with breakfast.   cholecalciferol 1000 units tablet Commonly known as: VITAMIN D Take 1,000 Units by mouth daily.   methocarbamol 500 MG tablet Commonly known as: ROBAXIN Take 500 mg by mouth every 6 (six) hours as needed for muscle spasms.   multivitamin capsule Take 1 capsule by mouth daily.   ondansetron 8 MG disintegrating tablet Commonly known as: ZOFRAN-ODT Take 8 mg by mouth every 8 (eight) hours as needed for nausea or vomiting.   oxyCODONE 5 MG immediate release tablet Commonly known as: Oxy IR/ROXICODONE Take 5 mg by mouth every 4 (four) hours as needed for severe pain. What changed: Another medication with the same  name was added. Make sure you understand how and when to take each.   oxyCODONE 5 MG immediate release tablet Commonly known as: Oxy IR/ROXICODONE  Take 1-2 tablets (5-10 mg total) by mouth every 4 (four) hours as needed for moderate pain (pain score 4-6). What changed: You were already taking a medication with the same name, and this prescription was added. Make sure you understand how and when to take each.   traZODone 50 MG tablet Commonly known as: DESYREL Take 50 mg by mouth at bedtime as needed for sleep.       Diagnostic Studies: No results found.  They benefited maximally from their hospital stay and there were no complications.     Disposition: Discharge disposition: 01-Home or Self Care      Discharge Instructions    Call MD / Call 911   Complete by: As directed    If you experience chest pain or shortness of breath, CALL 911 and be transported to the hospital emergency room.  If you develope a fever above 101 F, pus (white drainage) or increased drainage or redness at the wound, or calf pain, call your surgeon's office.   Constipation Prevention   Complete by: As directed    Drink plenty of fluids.  Prune juice may be helpful.  You may use a stool softener, such as Colace (over the counter) 100 mg twice a day.  Use MiraLax (over the counter) for constipation as needed.   Diet - low sodium heart healthy   Complete by: As directed    Increase activity slowly as tolerated   Complete by: As directed      Follow-up Information    Roseanne Kaufman, MD Follow up in 12 day(s).   Specialty: Orthopedic Surgery Why: We will call for your follow-up to be seen in 12 to 14 days Contact information: [address] [place] [address] [id number]          Patient is stable on examination.  She is neurovascularly intact.  We will DC her home with bedside commode and bedpan per her request.  We will also plan for RTC in 12 to 14 days for outpatient  follow-up.  At the time of discharge she is awake alert oriented has no signs of DVT infection or other problems.  Her discharge medicines will include pain management as previously noted and close careful follow-up for any problems.  Overall she is done beautifully with her hospital stay.  Final diagnosis status post ORIF right radius fracture and ORIF left elbow/radial head intra-articular fracture.  Her weightbearing is stable at present time.  Signed: Satira Anis Sabriel Borromeo III 02/16/2019, 10:30 AM

## 2019-02-16 NOTE — Progress Notes (Signed)
Nicole Delgado to be D/C'd Home per MD order.  Discussed prescriptions and follow up appointments with the patient. Prescriptions given to patient, medication list explained in detail. Pt verbalized understanding.  Allergies as of 02/16/2019      Reactions   Penicillins Anaphylaxis, Hives   Did it involve swelling of the face/tongue/throat, SOB, or low BP? Yes Did it involve sudden or severe rash/hives, skin peeling, or any reaction on the inside of your mouth or nose? Yes Did you need to seek medical attention at a hospital or doctor's office? Yes When did it last happen?In her 35s If all above answers are "NO", may proceed with cephalosporin use.   Bee Venom Swelling   Valium [diazepam] Other (See Comments)   Hallucinations      Medication List    TAKE these medications   albuterol 108 (90 Base) MCG/ACT inhaler Commonly known as: VENTOLIN HFA Inhale 2 puffs into the lungs every 6 (six) hours as needed for wheezing or shortness of breath.   atorvastatin 40 MG tablet Commonly known as: LIPITOR Take 40 mg by mouth daily.   Calcium 600 600 MG Tabs tablet Generic drug: calcium carbonate Take 1,200 mg by mouth daily with breakfast.   cholecalciferol 1000 units tablet Commonly known as: VITAMIN D Take 1,000 Units by mouth daily.   methocarbamol 500 MG tablet Commonly known as: ROBAXIN Take 500 mg by mouth every 6 (six) hours as needed for muscle spasms.   multivitamin capsule Take 1 capsule by mouth daily.   ondansetron 8 MG disintegrating tablet Commonly known as: ZOFRAN-ODT Take 8 mg by mouth every 8 (eight) hours as needed for nausea or vomiting.   oxyCODONE 5 MG immediate release tablet Commonly known as: Oxy IR/ROXICODONE Take 5 mg by mouth every 4 (four) hours as needed for severe pain. What changed: Another medication with the same name was added. Make sure you understand how and when to take each.   oxyCODONE 5 MG immediate release tablet Commonly known as:  Oxy IR/ROXICODONE Take 1-2 tablets (5-10 mg total) by mouth every 4 (four) hours as needed for moderate pain (pain score 4-6). What changed: You were already taking a medication with the same name, and this prescription was added. Make sure you understand how and when to take each.   traZODone 50 MG tablet Commonly known as: DESYREL Take 50 mg by mouth at bedtime as needed for sleep.       Vitals:   02/16/19 0553 02/16/19 0759  BP: (!) 158/84 (!) 181/79  Pulse: 83 85  Resp:  18  Temp: 99.2 F (37.3 C) 98.9 F (37.2 C)  SpO2: 90% 100%    Skin clean, dry and intact without evidence of skin break down, no evidence of skin tears noted. BUE clean, dry, and intact. IV catheter discontinued intact. Site without signs and symptoms of complications. Dressing and pressure applied. Pt denies pain at this time. No complaints noted.  An After Visit Summary was printed and given to the patient. Patient escorted via [place], and D/C home via private auto.  Ron[place] RN

## 2019-02-16 NOTE — Discharge Instructions (Signed)
Please remember to elevate move your fingers frequently.  Please be activity as described and discussed with your doctor.  Please call for any problems.  We will call to schedule an outpatient follow-up and 12 days.  We recommend that you to take vitamin C 1000 mg a day to promote healing. We also recommend that if you require  pain medicine that you take a stool softener to prevent constipation as most pain medicines will have constipation side effects. We recommend either Peri-Colace or Senokot and recommend that you also consider adding MiraLAX as well to prevent the constipation affects from pain medicine if you are required to use them. These medicines are over the counter and may be purchased at a local pharmacy. A cup of yogurt and a probiotic can also be helpful during the recovery process as the medicines can disrupt your intestinal environment. Keep bandage clean and dry.  Call for any problems.  No smoking.  Criteria for driving a car: you should be off your pain medicine for 7-8 hours, able to drive one handed(confident), thinking clearly and feeling able in your judgement to drive. Continue elevation as it will decrease swelling.  If instructed by MD move your fingers within the confines of the bandage/splint.  Use ice if instructed by your MD. Call immediately for any sudden loss of feeling in your hand/arm or change in functional abilities of the extremity.

## 2019-02-16 NOTE — TOC Initial Note (Signed)
Transition of Care [facility]) - Initial/Assessment Note    Patient Details  Name: Nicole Delgado MRN: [id number] Date of Birth: 27-Oct-1950  Transition of Care [facility]) CM/SW Contact:    Bartholomew Crews, RN Phone Number: [phone] 02/16/2019, 11:25 AM  Clinical Narrative:                 Spoke with patient at the bedside. PTA home with sister and niece. Stated that they are able to assist her as needed along with several friends. Discussed unable to get elevated toilet seat today, but will follow up with [employer] in the morning to have delivered to home. Patient is agreeable to this. No other transition of care needs identified at this time.   Expected Discharge Plan: Home/Self Care Barriers to Discharge: No Barriers Identified   Patient Goals and CMS Choice Patient states their goals for this hospitalization and ongoing recovery are:: ready to go home CMS Medicare.gov Compare Post Acute Care list provided to:: Patient Choice offered to / list presented to : Patient  Expected Discharge Plan and Services Expected Discharge Plan: Home/Self Care In-house Referral: [facility] Discharge Planning Services: CM Consult Post Acute Care Choice: Durable Medical Equipment(elevated toilet seat) Living arrangements for the past 2 months: Single Family Home Expected Discharge Date: 02/16/19               DME Arranged: Other see comment(elevated toilet seat) DME Agency: [employer]       HH Arranged: NA HH Agency: NA        Prior Living Arrangements/Services Living arrangements for the past 2 months: [place] with:: Self, Siblings, Relatives Patient language and need for interpreter reviewed:: Yes Do you feel safe going back to the place where you live?: Yes      Need for Family Participation in Patient Care: Yes (Comment) Care giver support system in place?: Yes (comment)   Criminal Activity/Legal Involvement Pertinent to Current Situation/Hospitalization: No - Comment as  needed  Activities of Daily Living Home Assistive Devices/Equipment: None ADL Screening (condition at time of admission) Patient's cognitive ability adequate to safely complete daily activities?: Yes Is the patient deaf or have difficulty hearing?: No Does the patient have difficulty seeing, even when wearing glasses/contacts?: No Does the patient have difficulty concentrating, remembering, or making decisions?: No Patient able to express need for assistance with ADLs?: Yes Does the patient have difficulty dressing or bathing?: Yes Independently performs ADLs?: No Communication: Independent Dressing (OT): Needs assistance Is this a change from baseline?: Change from baseline, expected to last >3 days Grooming: Needs assistance Is this a change from baseline?: Change from baseline, expected to last >3 days Feeding: Needs assistance Is this a change from baseline?: Change from baseline, expected to last >3 days Bathing: Needs assistance Is this a change from baseline?: Change from baseline, expected to last >3 days Toileting: Needs assistance Is this a change from baseline?: Change from baseline, expected to last >3days In/Out Bed: Needs assistance Is this a change from baseline?: Change from baseline, expected to last >3 days Walks in Home: Needs assistance Is this a change from baseline?: Change from baseline, expected to last >3 days Does the patient have difficulty walking or climbing stairs?: Yes Weakness of Legs: None Weakness of Arms/Hands: Both  Permission Sought/Granted                  Emotional Assessment Appearance:: Appears younger than stated age Attitude/Demeanor/Rapport: Engaged Affect (typically observed): Accepting Orientation: : Oriented to  Situation, Oriented to  Time, Oriented to Place, Oriented to Self Alcohol / Substance Use: Not Applicable Psych Involvement: No (comment)  Admission diagnosis:  Comminuted complex intra articular right distal radius  fracture, left elbow fracture Patient Active Problem List   Diagnosis Date Noted  . Radius distal fracture 02/14/2019  . Keratoconus 03/27/2017  . Cough variant asthma vs UACS  12/25/2016  . Osteoporosis 11/06/2014  . Vitamin D deficiency 10/17/2014  . Hepatic adenoma 04/20/2013  . Chronic right shoulder pain 04/20/2013  . Insomnia 04/20/2013   PCP:  Shawnee Knapp, MD Pharmacy:   [facility] # [address] [place] [address] [place] [address] Phone: [phone] Fax: [phone]     Social Determinants of Health (SDOH) Interventions    Readmission Risk Interventions No flowsheet data found.

## 2019-02-17 ENCOUNTER — Encounter (HOSPITAL_COMMUNITY): Payer: Self-pay | Admitting: Orthopedic Surgery

## 2019-02-18 DIAGNOSIS — S52509A Unspecified fracture of the lower end of unspecified radius, initial encounter for closed fracture: Secondary | ICD-10-CM | POA: Diagnosis not present

## 2019-02-26 DIAGNOSIS — Z5189 Encounter for other specified aftercare: Secondary | ICD-10-CM | POA: Diagnosis not present

## 2019-02-26 DIAGNOSIS — S52501D Unspecified fracture of the lower end of right radius, subsequent encounter for closed fracture with routine healing: Secondary | ICD-10-CM | POA: Diagnosis not present

## 2019-02-26 DIAGNOSIS — M25522 Pain in left elbow: Secondary | ICD-10-CM | POA: Diagnosis not present

## 2019-02-26 DIAGNOSIS — S52125A Nondisplaced fracture of head of left radius, initial encounter for closed fracture: Secondary | ICD-10-CM | POA: Diagnosis not present

## 2019-02-26 DIAGNOSIS — M25532 Pain in left wrist: Secondary | ICD-10-CM | POA: Diagnosis not present

## 2019-03-12 DIAGNOSIS — S52501D Unspecified fracture of the lower end of right radius, subsequent encounter for closed fracture with routine healing: Secondary | ICD-10-CM | POA: Diagnosis not present

## 2019-03-12 DIAGNOSIS — M25531 Pain in right wrist: Secondary | ICD-10-CM | POA: Diagnosis not present

## 2019-03-12 DIAGNOSIS — S52122D Displaced fracture of head of left radius, subsequent encounter for closed fracture with routine healing: Secondary | ICD-10-CM | POA: Diagnosis not present

## 2019-03-12 DIAGNOSIS — M25522 Pain in left elbow: Secondary | ICD-10-CM | POA: Diagnosis not present

## 2019-03-12 DIAGNOSIS — Z5189 Encounter for other specified aftercare: Secondary | ICD-10-CM | POA: Diagnosis not present

## 2019-03-20 DIAGNOSIS — M25531 Pain in right wrist: Secondary | ICD-10-CM | POA: Diagnosis not present

## 2019-03-20 DIAGNOSIS — M25522 Pain in left elbow: Secondary | ICD-10-CM | POA: Diagnosis not present

## 2019-03-26 DIAGNOSIS — M25522 Pain in left elbow: Secondary | ICD-10-CM | POA: Diagnosis not present

## 2019-03-26 DIAGNOSIS — M25531 Pain in right wrist: Secondary | ICD-10-CM | POA: Diagnosis not present

## 2019-04-02 DIAGNOSIS — M25531 Pain in right wrist: Secondary | ICD-10-CM | POA: Diagnosis not present

## 2019-04-08 DIAGNOSIS — S52122D Displaced fracture of head of left radius, subsequent encounter for closed fracture with routine healing: Secondary | ICD-10-CM | POA: Diagnosis not present

## 2019-04-08 DIAGNOSIS — S52501D Unspecified fracture of the lower end of right radius, subsequent encounter for closed fracture with routine healing: Secondary | ICD-10-CM | POA: Diagnosis not present

## 2019-04-08 DIAGNOSIS — M25531 Pain in right wrist: Secondary | ICD-10-CM | POA: Diagnosis not present

## 2019-04-08 DIAGNOSIS — M65331 Trigger finger, right middle finger: Secondary | ICD-10-CM | POA: Diagnosis not present

## 2019-04-08 DIAGNOSIS — M65321 Trigger finger, right index finger: Secondary | ICD-10-CM | POA: Diagnosis not present

## 2019-04-08 DIAGNOSIS — Z5189 Encounter for other specified aftercare: Secondary | ICD-10-CM | POA: Diagnosis not present

## 2019-04-23 DIAGNOSIS — M25531 Pain in right wrist: Secondary | ICD-10-CM | POA: Diagnosis not present

## 2019-04-30 DIAGNOSIS — M25531 Pain in right wrist: Secondary | ICD-10-CM | POA: Diagnosis not present

## 2019-05-11 DIAGNOSIS — M25531 Pain in right wrist: Secondary | ICD-10-CM | POA: Diagnosis not present

## 2019-05-11 DIAGNOSIS — M25522 Pain in left elbow: Secondary | ICD-10-CM | POA: Diagnosis not present

## 2019-05-11 DIAGNOSIS — Z5189 Encounter for other specified aftercare: Secondary | ICD-10-CM | POA: Diagnosis not present

## 2019-05-11 DIAGNOSIS — M65321 Trigger finger, right index finger: Secondary | ICD-10-CM | POA: Diagnosis not present

## 2019-05-14 DIAGNOSIS — H18623 Keratoconus, unstable, bilateral: Secondary | ICD-10-CM | POA: Diagnosis not present

## 2019-05-19 DIAGNOSIS — R7303 Prediabetes: Secondary | ICD-10-CM | POA: Diagnosis not present

## 2019-05-19 DIAGNOSIS — I10 Essential (primary) hypertension: Secondary | ICD-10-CM | POA: Diagnosis not present

## 2019-05-19 DIAGNOSIS — Z03818 Encounter for observation for suspected exposure to other biological agents ruled out: Secondary | ICD-10-CM | POA: Diagnosis not present

## 2019-05-19 DIAGNOSIS — E7800 Pure hypercholesterolemia, unspecified: Secondary | ICD-10-CM | POA: Diagnosis not present

## 2019-05-19 DIAGNOSIS — Z79899 Other long term (current) drug therapy: Secondary | ICD-10-CM | POA: Diagnosis not present

## 2019-05-19 DIAGNOSIS — Z1159 Encounter for screening for other viral diseases: Secondary | ICD-10-CM | POA: Diagnosis not present

## 2019-06-08 DIAGNOSIS — M25522 Pain in left elbow: Secondary | ICD-10-CM | POA: Diagnosis not present

## 2019-06-08 DIAGNOSIS — Z5189 Encounter for other specified aftercare: Secondary | ICD-10-CM | POA: Diagnosis not present

## 2019-06-08 DIAGNOSIS — M25531 Pain in right wrist: Secondary | ICD-10-CM | POA: Diagnosis not present

## 2019-06-30 ENCOUNTER — Ambulatory Visit: Payer: PPO | Attending: Internal Medicine

## 2019-06-30 DIAGNOSIS — Z23 Encounter for immunization: Secondary | ICD-10-CM | POA: Insufficient documentation

## 2019-06-30 NOTE — Progress Notes (Signed)
   Covid-19 Vaccination Clinic  Name:  Nicole Delgado    MRN: [id number] DOB: 12-Feb-1951  06/30/2019  [name] was observed post Covid-19 immunization for 15 minutes without incidence. She was provided with Vaccine Information Sheet and instruction to access the V-Safe system.   [name] was instructed to call 911 with any severe reactions post vaccine: Marland Kitchen Difficulty breathing  . Swelling of your face and throat  . A fast heartbeat  . A bad rash all over your body  . Dizziness and weakness    Immunizations Administered    Name Date Dose VIS Date Route   Pfizer COVID-19 Vaccine 06/30/2019  5:38 PM 0.3 mL 05/22/2019 Intramuscular   Manufacturer: [place]   Lot: [id number]   [place]: [id number]

## 2019-07-20 ENCOUNTER — Ambulatory Visit: Payer: PPO

## 2019-07-21 ENCOUNTER — Ambulatory Visit: Payer: PPO | Attending: Internal Medicine

## 2019-07-21 DIAGNOSIS — Z23 Encounter for immunization: Secondary | ICD-10-CM

## 2019-07-21 NOTE — Progress Notes (Signed)
   Covid-19 Vaccination Clinic  Name:  Nicole Delgado    MRN: [id number] DOB: 1950/09/08  07/21/2019  [name] was observed post Covid-19 immunization for 15 minutes without incidence. She was provided with Vaccine Information Sheet and instruction to access the V-Safe system.   [name] was instructed to call 911 with any severe reactions post vaccine: Marland Kitchen Difficulty breathing  . Swelling of your face and throat  . A fast heartbeat  . A bad rash all over your body  . Dizziness and weakness    Immunizations Administered    Name Date Dose VIS Date Route   Pfizer COVID-19 Vaccine 07/21/2019 10:04 AM 0.3 mL 05/22/2019 Intramuscular   Manufacturer: [place]   Lot: [id number]   [place]: [id number]

## 2019-07-22 ENCOUNTER — Ambulatory Visit: Payer: PPO

## 2019-07-27 DIAGNOSIS — K0510 Chronic gingivitis, plaque induced: Secondary | ICD-10-CM | POA: Diagnosis not present

## 2019-07-27 DIAGNOSIS — S62101S Fracture of unspecified carpal bone, right wrist, sequela: Secondary | ICD-10-CM | POA: Diagnosis not present

## 2019-07-27 DIAGNOSIS — R0982 Postnasal drip: Secondary | ICD-10-CM | POA: Diagnosis not present

## 2019-07-29 DIAGNOSIS — M25531 Pain in right wrist: Secondary | ICD-10-CM | POA: Diagnosis not present

## 2019-07-29 DIAGNOSIS — M79644 Pain in right finger(s): Secondary | ICD-10-CM | POA: Diagnosis not present

## 2019-08-17 DIAGNOSIS — Z1159 Encounter for screening for other viral diseases: Secondary | ICD-10-CM | POA: Diagnosis not present

## 2019-08-17 DIAGNOSIS — E7800 Pure hypercholesterolemia, unspecified: Secondary | ICD-10-CM | POA: Diagnosis not present

## 2019-08-17 DIAGNOSIS — R350 Frequency of micturition: Secondary | ICD-10-CM | POA: Diagnosis not present

## 2019-08-17 DIAGNOSIS — Z79899 Other long term (current) drug therapy: Secondary | ICD-10-CM | POA: Diagnosis not present

## 2019-08-17 DIAGNOSIS — I10 Essential (primary) hypertension: Secondary | ICD-10-CM | POA: Diagnosis not present

## 2019-08-17 DIAGNOSIS — R7303 Prediabetes: Secondary | ICD-10-CM | POA: Diagnosis not present

## 2019-08-24 DIAGNOSIS — M4316 Spondylolisthesis, lumbar region: Secondary | ICD-10-CM | POA: Diagnosis not present

## 2019-08-24 DIAGNOSIS — M9902 Segmental and somatic dysfunction of thoracic region: Secondary | ICD-10-CM | POA: Diagnosis not present

## 2019-08-24 DIAGNOSIS — M9903 Segmental and somatic dysfunction of lumbar region: Secondary | ICD-10-CM | POA: Diagnosis not present

## 2019-08-24 DIAGNOSIS — M9901 Segmental and somatic dysfunction of cervical region: Secondary | ICD-10-CM | POA: Diagnosis not present

## 2019-08-26 DIAGNOSIS — M9903 Segmental and somatic dysfunction of lumbar region: Secondary | ICD-10-CM | POA: Diagnosis not present

## 2019-08-26 DIAGNOSIS — M4316 Spondylolisthesis, lumbar region: Secondary | ICD-10-CM | POA: Diagnosis not present

## 2019-08-26 DIAGNOSIS — M9901 Segmental and somatic dysfunction of cervical region: Secondary | ICD-10-CM | POA: Diagnosis not present

## 2019-08-26 DIAGNOSIS — M9902 Segmental and somatic dysfunction of thoracic region: Secondary | ICD-10-CM | POA: Diagnosis not present

## 2019-08-27 DIAGNOSIS — M9901 Segmental and somatic dysfunction of cervical region: Secondary | ICD-10-CM | POA: Diagnosis not present

## 2019-08-27 DIAGNOSIS — M4316 Spondylolisthesis, lumbar region: Secondary | ICD-10-CM | POA: Diagnosis not present

## 2019-08-27 DIAGNOSIS — M9902 Segmental and somatic dysfunction of thoracic region: Secondary | ICD-10-CM | POA: Diagnosis not present

## 2019-08-27 DIAGNOSIS — M9903 Segmental and somatic dysfunction of lumbar region: Secondary | ICD-10-CM | POA: Diagnosis not present

## 2019-09-01 DIAGNOSIS — M9902 Segmental and somatic dysfunction of thoracic region: Secondary | ICD-10-CM | POA: Diagnosis not present

## 2019-09-01 DIAGNOSIS — M4316 Spondylolisthesis, lumbar region: Secondary | ICD-10-CM | POA: Diagnosis not present

## 2019-09-01 DIAGNOSIS — M9901 Segmental and somatic dysfunction of cervical region: Secondary | ICD-10-CM | POA: Diagnosis not present

## 2019-09-01 DIAGNOSIS — M9903 Segmental and somatic dysfunction of lumbar region: Secondary | ICD-10-CM | POA: Diagnosis not present

## 2019-09-02 DIAGNOSIS — M9901 Segmental and somatic dysfunction of cervical region: Secondary | ICD-10-CM | POA: Diagnosis not present

## 2019-09-02 DIAGNOSIS — M9902 Segmental and somatic dysfunction of thoracic region: Secondary | ICD-10-CM | POA: Diagnosis not present

## 2019-09-02 DIAGNOSIS — M9903 Segmental and somatic dysfunction of lumbar region: Secondary | ICD-10-CM | POA: Diagnosis not present

## 2019-09-02 DIAGNOSIS — M4316 Spondylolisthesis, lumbar region: Secondary | ICD-10-CM | POA: Diagnosis not present

## 2019-09-04 DIAGNOSIS — M9902 Segmental and somatic dysfunction of thoracic region: Secondary | ICD-10-CM | POA: Diagnosis not present

## 2019-09-04 DIAGNOSIS — M9901 Segmental and somatic dysfunction of cervical region: Secondary | ICD-10-CM | POA: Diagnosis not present

## 2019-09-04 DIAGNOSIS — M9903 Segmental and somatic dysfunction of lumbar region: Secondary | ICD-10-CM | POA: Diagnosis not present

## 2019-09-04 DIAGNOSIS — M4316 Spondylolisthesis, lumbar region: Secondary | ICD-10-CM | POA: Diagnosis not present

## 2019-09-07 DIAGNOSIS — M4316 Spondylolisthesis, lumbar region: Secondary | ICD-10-CM | POA: Diagnosis not present

## 2019-09-07 DIAGNOSIS — M9902 Segmental and somatic dysfunction of thoracic region: Secondary | ICD-10-CM | POA: Diagnosis not present

## 2019-09-07 DIAGNOSIS — M9903 Segmental and somatic dysfunction of lumbar region: Secondary | ICD-10-CM | POA: Diagnosis not present

## 2019-09-07 DIAGNOSIS — M9901 Segmental and somatic dysfunction of cervical region: Secondary | ICD-10-CM | POA: Diagnosis not present

## 2019-09-30 ENCOUNTER — Ambulatory Visit
Admission: RE | Admit: 2019-09-30 | Discharge: 2019-09-30 | Disposition: A | Discharge status: Home / Self Care | Payer: Medicare HMO | Source: Ambulatory Visit | Attending: Anesthesiology | Admitting: Anesthesiology

## 2019-09-30 ENCOUNTER — Other Ambulatory Visit: Payer: Self-pay | Admitting: Anesthesiology

## 2019-09-30 ENCOUNTER — Other Ambulatory Visit: Payer: Self-pay

## 2019-09-30 DIAGNOSIS — M47816 Spondylosis without myelopathy or radiculopathy, lumbar region: Secondary | ICD-10-CM

## 2019-09-30 DIAGNOSIS — M533 Sacrococcygeal disorders, not elsewhere classified: Secondary | ICD-10-CM

## 2020-01-11 ENCOUNTER — Other Ambulatory Visit: Payer: Self-pay | Admitting: Nurse Practitioner

## 2020-01-11 DIAGNOSIS — M5136 Other intervertebral disc degeneration, lumbar region: Secondary | ICD-10-CM

## 2020-01-11 DIAGNOSIS — M5416 Radiculopathy, lumbar region: Secondary | ICD-10-CM

## 2020-01-15 ENCOUNTER — Other Ambulatory Visit: Payer: Self-pay

## 2020-01-15 ENCOUNTER — Ambulatory Visit
Admission: RE | Admit: 2020-01-15 | Discharge: 2020-01-15 | Disposition: A | Discharge status: Home / Self Care | Payer: Medicare HMO | Source: Ambulatory Visit | Attending: Nurse Practitioner | Admitting: Nurse Practitioner

## 2020-01-15 DIAGNOSIS — M5416 Radiculopathy, lumbar region: Secondary | ICD-10-CM

## 2020-01-15 DIAGNOSIS — M5136 Other intervertebral disc degeneration, lumbar region: Secondary | ICD-10-CM

## 2020-10-30 ENCOUNTER — Emergency Department (HOSPITAL_BASED_OUTPATIENT_CLINIC_OR_DEPARTMENT_OTHER)
Admission: EM | Admit: 2020-10-30 | Discharge: 2020-10-30 | Disposition: A | Discharge status: Home / Self Care | Payer: Medicare HMO | Attending: Emergency Medicine | Admitting: Emergency Medicine

## 2020-10-30 ENCOUNTER — Emergency Department (HOSPITAL_BASED_OUTPATIENT_CLINIC_OR_DEPARTMENT_OTHER): Payer: Medicare HMO

## 2020-10-30 ENCOUNTER — Encounter (HOSPITAL_BASED_OUTPATIENT_CLINIC_OR_DEPARTMENT_OTHER): Payer: Self-pay

## 2020-10-30 ENCOUNTER — Other Ambulatory Visit: Payer: Self-pay

## 2020-10-30 DIAGNOSIS — R519 Headache, unspecified: Secondary | ICD-10-CM | POA: Insufficient documentation

## 2020-10-30 DIAGNOSIS — J45909 Unspecified asthma, uncomplicated: Secondary | ICD-10-CM | POA: Diagnosis not present

## 2020-10-30 MED ORDER — ONDANSETRON 4 MG PO TBDP
4.0000 mg | ORAL_TABLET | Freq: Once | ORAL | Status: AC | PRN
  Administered 2020-10-30: 4 mg via ORAL
  Filled 2020-10-30: qty 1

## 2020-10-30 MED ORDER — KETOROLAC TROMETHAMINE 60 MG/2ML IM SOLN
60.0000 mg | Freq: Once | INTRAMUSCULAR | Status: AC
  Administered 2020-10-30: 60 mg via INTRAMUSCULAR
  Filled 2020-10-30: qty 2

## 2020-10-30 MED ORDER — PROCHLORPERAZINE MALEATE 10 MG PO TABS: 10.0000 mg | ORAL_TABLET | Freq: Two times a day (BID) | ORAL | 0 refills | Status: DC | PRN

## 2020-10-30 MED ORDER — IBUPROFEN 400 MG PO TABS
400.0000 mg | ORAL_TABLET | Freq: Once | ORAL | Status: AC | PRN
  Administered 2020-10-30: 400 mg via ORAL
  Filled 2020-10-30: qty 1

## 2020-10-30 NOTE — ED Triage Notes (Signed)
Pt presents with a 2 day history of HA without photophobia. Pt also reports elevated B/P.

## 2020-10-30 NOTE — ED Provider Notes (Signed)
[place] EMERGENCY DEPT Provider Note   CSN: [id number] Arrival date & time: 10/30/20  1615     History Chief Complaint  Patient presents with  . Headache    Nicole Delgado is a 70 y.o. female.  HPI     2 days of headache Left sided sharp headache  Reports normally at night when laying down Back pain hurting, on OTC medications for a year and not sure if taking too much aleve, BC powders 2-3 times per day, now Dr. Doing MRI tomorrow Was going to hold off the medications because going to MRI When came off of the medications, headache became severe Having occasionally having headaches before that but not as severe, last week has noticed it at night, last night at [employer] and had to sit down Today thought was doing better until this afternoon Denies numbness, weakness, difficulty talking or walking, visual changes or facial droop, does feel off balance while walking for several months, not sure if it is due to back pain and symptoms. Stabbing headache No fevers, nausea or vomiting Had not noticed being worse with bright light or loud sounds No fam hx of aneurysm    Past Medical History:  Diagnosis Date  . Allergy   . Arthritis    hands  . Asthma    seasonal  . Keratoconus    followed by optho at [facility], on retinal transplant list, legally blind  . Osteoporosis   . Pre-diabetes     Patient Active Problem List   Diagnosis Date Noted  . Radius distal fracture 02/14/2019  . Keratoconus 03/27/2017  . Cough variant asthma vs UACS  12/25/2016  . Osteoporosis 11/06/2014  . Vitamin D deficiency 10/17/2014  . Hepatic adenoma 04/20/2013  . Chronic right shoulder pain 04/20/2013  . Insomnia 04/20/2013    Past Surgical History:  Procedure Laterality Date  . ABDOMINAL HYSTERECTOMY  1995  . BIOPSY BREAST Left 12/11/2017   states she was advised biopsy was benign  . BREAST EXCISIONAL BIOPSY Bilateral    [id number], 1973, 1990 benign  . CATARACT  EXTRACTION    . COLONOSCOPY W/ POLYPECTOMY    . OPEN REDUCTION INTERNAL FIXATION (ORIF) DISTAL RADIAL FRACTURE Right 02/14/2019   Procedure: RIGHT OPEN REDUCTION INTERNAL FIXATION (ORIF) DISTAL RADIAL FRACTURE WITH REPAIR RECONSTRUCTION AS NECESSARY;  Surgeon: Roseanne Kaufman, MD;  Location: [facility];  Service: Orthopedics;  Laterality: Right;  . ORIF ELBOW FRACTURE Left 02/14/2019   Procedure: LEFT OPEN REDUCTION INTERNAL FIXATION (ORIF) ELBOW;  Surgeon: Roseanne Kaufman, MD;  Location: [place];  Service: Orthopedics;  Laterality: Left;  . rotator cuff surgery Right      OB History    Gravida  0   Para      Term      Preterm      AB      Living  0     SAB      IAB      Ectopic      Multiple      Live Births              Family History  Problem Relation Age of Onset  . Alzheimer's disease Mother   . Heart Problems Father   . Heart disease Father   . Cancer Brother   . Hypertension Sister   . Colon cancer Neg Hx   . Esophageal cancer Neg Hx   . Liver cancer Neg Hx   . Pancreatic cancer Neg Hx   .  Rectal cancer Neg Hx   . Stomach cancer Neg Hx     Social History   Tobacco Use  . Smoking status: Never Smoker  . Smokeless tobacco: Never Used  Vaping Use  . Vaping Use: Former  Substance Use Topics  . Alcohol use: No  . Drug use: No    Home Medications Prior to Admission medications   Medication Sig Start Date End Date Taking? Authorizing Provider  prochlorperazine (COMPAZINE) 10 MG tablet Take 1 tablet (10 mg total) by mouth 2 (two) times daily as needed for nausea or vomiting (Headache, May take with benadryl/diphenhydramine 25mg ). 10/30/20  Yes Gareth Morgan, MD  albuterol (PROVENTIL HFA;VENTOLIN HFA) 108 (90 Base) MCG/ACT inhaler Inhale 2 puffs into the lungs every 6 (six) hours as needed for wheezing or shortness of breath. 11/21/16   Alveda Reasons, MD  atorvastatin (LIPITOR) 40 MG tablet Take 40 mg by mouth daily.    [provider]  calcium  carbonate (CALCIUM 600) 600 MG TABS tablet Take 1,200 mg by mouth daily with breakfast.     [provider]  cholecalciferol (VITAMIN D) 1000 units tablet Take 1,000 Units by mouth daily.    [provider]  methocarbamol (ROBAXIN) 500 MG tablet Take 500 mg by mouth every 6 (six) hours as needed for muscle spasms.    [provider]  Multiple Vitamin (MULTIVITAMIN) capsule Take 1 capsule by mouth daily.    [provider]  ondansetron (ZOFRAN-ODT) 8 MG disintegrating tablet Take 8 mg by mouth every 8 (eight) hours as needed for nausea or vomiting.    [provider]  oxyCODONE (OXY IR/ROXICODONE) 5 MG immediate release tablet Take 5 mg by mouth every 4 (four) hours as needed for severe pain.    [provider]  oxyCODONE (OXY IR/ROXICODONE) 5 MG immediate release tablet Take 1-2 tablets (5-10 mg total) by mouth every 4 (four) hours as needed for moderate pain (pain score 4-6). 02/16/19   Roseanne Kaufman, MD  traZODone (DESYREL) 50 MG tablet Take 50 mg by mouth at bedtime as needed for sleep.    [provider]    Allergies    Penicillins, Bee venom, and Valium [diazepam]  Review of Systems   Review of Systems  Constitutional: Negative for fever.  HENT: Negative for sore throat.   Eyes: Negative for visual disturbance.  Respiratory: Negative for cough and shortness of breath.   Cardiovascular: Negative for chest pain.  Gastrointestinal: Negative for abdominal pain.  Genitourinary: Negative for difficulty urinating.  Musculoskeletal: Negative for back pain and neck pain.  Skin: Negative for rash.  Neurological: Positive for headaches. Negative for syncope.    Physical Exam Updated Vital Signs BP (!) 168/74 (BP Location: Left Arm)   Pulse (!) 58   Temp 98.5 F (36.9 C) (Oral)   Resp 16   Ht 5\' 6"  (1.676 m)   Wt 65.8 kg   SpO2 98%   BMI 23.40 kg/m   Physical Exam Vitals and nursing note reviewed.  Constitutional:       General: She is not in acute distress.    Appearance: Normal appearance. She is well-developed. She is not ill-appearing or diaphoretic.  HENT:     Head: Normocephalic and atraumatic.  Eyes:     General: No visual field deficit.    Extraocular Movements: Extraocular movements intact.     Conjunctiva/sclera: Conjunctivae normal.     Pupils: Pupils are equal, round, and reactive to light.  Cardiovascular:  Rate and Rhythm: Normal rate and regular rhythm.     Pulses: Normal pulses.     Heart sounds: Normal heart sounds. No murmur heard. No friction rub. No gallop.   Pulmonary:     Effort: Pulmonary effort is normal. No respiratory distress.     Breath sounds: Normal breath sounds. No wheezing or rales.  Abdominal:     General: There is no distension.     Palpations: Abdomen is soft.     Tenderness: There is no abdominal tenderness. There is no guarding.  Musculoskeletal:        General: No swelling or tenderness.     Cervical back: Normal range of motion.  Skin:    General: Skin is warm and dry.     Findings: No erythema or rash.  Neurological:     General: No focal deficit present.     Mental Status: She is alert and oriented to person, place, and time.     GCS: GCS eye subscore is 4. GCS verbal subscore is 5. GCS motor subscore is 6.     Cranial Nerves: No cranial nerve deficit, dysarthria or facial asymmetry.     Sensory: No sensory deficit.     Motor: No weakness or tremor.     Coordination: Coordination normal. Finger-Nose-Finger Test normal.     Gait: Gait normal.     ED Results / Procedures / Treatments   Labs (all labs ordered are listed, but only abnormal results are displayed) Labs Reviewed - No data to display  EKG None  Radiology CT Head Wo Contrast  Result Date: 10/30/2020 CLINICAL DATA:  Headache x2 days. EXAM: CT HEAD WITHOUT CONTRAST TECHNIQUE: Contiguous axial images were obtained from the base of the skull through the vertex without intravenous  contrast. COMPARISON:  None. FINDINGS: Brain: There is mild cerebral atrophy with widening of the extra-axial spaces and ventricular dilatation. There are areas of decreased attenuation within the white matter tracts of the supratentorial brain, consistent with microvascular disease changes. Tiny chronic bilateral basal ganglia lacunar infarcts are noted. Vascular: No hyperdense vessel or unexpected calcification. Skull: Normal. Negative for fracture or focal lesion. Sinuses/Orbits: No acute finding. Other: None. IMPRESSION: No acute intracranial abnormality. Electronically Signed   By: Virgina Norfolk M.D.   On: 10/30/2020 20:44    Procedures Procedures   Medications Ordered in ED Medications  ibuprofen (ADVIL) tablet 400 mg (400 mg Oral Given 10/30/20 1657)  ondansetron (ZOFRAN-ODT) disintegrating tablet 4 mg (4 mg Oral Given 10/30/20 1657)  ketorolac (TORADOL) injection 60 mg (60 mg Intramuscular Given 10/30/20 2108)    ED Course  I have reviewed the triage vital signs and the nursing notes.  Pertinent labs & imaging results that were available during my care of the patient were reviewed by me and considered in my medical decision making (see chart for details).    MDM Rules/Calculators/A&P                          70 year old female presents to concern for headache. CT head without acute abnormalities.  Do not feel LP indicated for further evaluation.  Headache began slowly, no trauma, no fevers, and normal neurologic exam and have low suspicion for [facility], SDH, CVA, or meningitis.  Given nighttime headaches, age, recommend PCP follow up and consideration of further imaging as an outpatient.  Possible headaches related to withdrawal of NSAIDs as she just stopped medications before they developed.  Given rx for  compazine, recommend close PCP follow up.   Final Clinical Impression(s) / ED Diagnoses Final diagnoses:  Acute nonintractable headache, unspecified headache type    Rx / DC  Orders ED Discharge Orders         Ordered    prochlorperazine (COMPAZINE) 10 MG tablet  2 times daily PRN        10/30/20 2132           Gareth Morgan, MD 11/01/20 [phone]

## 2020-11-09 ENCOUNTER — Other Ambulatory Visit: Payer: Self-pay | Admitting: Orthopedic Surgery

## 2020-11-29 ENCOUNTER — Encounter (HOSPITAL_COMMUNITY)
Admission: RE | Admit: 2020-11-29 | Discharge: 2020-11-29 | Disposition: A | Discharge status: Home / Self Care | Payer: Medicare HMO | Source: Ambulatory Visit | Attending: Orthopedic Surgery | Admitting: Orthopedic Surgery

## 2020-11-29 ENCOUNTER — Other Ambulatory Visit: Payer: Self-pay

## 2020-11-29 ENCOUNTER — Encounter (HOSPITAL_COMMUNITY): Payer: Self-pay

## 2020-11-29 DIAGNOSIS — Z01812 Encounter for preprocedural laboratory examination: Secondary | ICD-10-CM | POA: Insufficient documentation

## 2020-11-29 DIAGNOSIS — Z20822 Contact with and (suspected) exposure to covid-19: Secondary | ICD-10-CM | POA: Insufficient documentation

## 2020-11-29 HISTORY — DX: Headache, unspecified: R51.9

## 2020-11-29 LAB — COMPREHENSIVE METABOLIC PANEL
ALT: 19 U/L (ref 0–44)
AST: 23 U/L (ref 15–41)
Albumin: 3.5 g/dL (ref 3.5–5.0)
Alkaline Phosphatase: 46 U/L (ref 38–126)
Anion gap: 7 (ref 5–15)
BUN: 10 mg/dL (ref 8–23)
CO2: 27 mmol/L (ref 22–32)
Calcium: 9.4 mg/dL (ref 8.9–10.3)
Chloride: 105 mmol/L (ref 98–111)
Creatinine, Ser: 0.76 mg/dL (ref 0.44–1.00)
GFR, Estimated: >60 mL/min (ref >60)
Glucose, Bld: 100 mg/dL — ABNORMAL HIGH (ref 70–99)
Potassium: 4.7 mmol/L (ref 3.5–5.1)
Sodium: 139 mmol/L (ref 135–145)
Total Bilirubin: 0.8 mg/dL (ref 0.3–1.2)
Total Protein: 6.7 g/dL (ref 6.5–8.1)

## 2020-11-29 LAB — PROTIME-INR
INR: 1 (ref 0.8–1.2)
Prothrombin Time: 13.3 seconds (ref 11.4–15.2)

## 2020-11-29 LAB — URINALYSIS, ROUTINE W REFLEX MICROSCOPIC
Bilirubin Urine: NEGATIVE
Glucose, UA: NEGATIVE mg/dL
Hgb urine dipstick: NEGATIVE
Ketones, ur: NEGATIVE mg/dL
Leukocytes,Ua: NEGATIVE
Nitrite: NEGATIVE
Protein, ur: NEGATIVE mg/dL
Specific Gravity, Urine: 1.017 (ref 1.005–1.030)
pH: 5 (ref 5.0–8.0)

## 2020-11-29 LAB — CBC WITH DIFFERENTIAL/PLATELET
Abs Immature Granulocytes: 0.01 10*3/uL (ref 0.00–0.07)
Basophils Absolute: 0 10*3/uL (ref 0.0–0.1)
Basophils Relative: 0 %
Eosinophils Absolute: 0.2 10*3/uL (ref 0.0–0.5)
Eosinophils Relative: 3 %
HCT: 43.1 % (ref 36.0–46.0)
Hemoglobin: 13.5 g/dL (ref 12.0–15.0)
Immature Granulocytes: 0 %
Lymphocytes Relative: 34 %
Lymphs Abs: 2 10*3/uL (ref 0.7–4.0)
MCH: 28.2 pg (ref 26.0–34.0)
MCHC: 31.3 g/dL (ref 30.0–36.0)
MCV: 90 fL (ref 80.0–100.0)
Monocytes Absolute: 0.5 10*3/uL (ref 0.1–1.0)
Monocytes Relative: 8 %
Neutro Abs: 3.2 10*3/uL (ref 1.7–7.7)
Neutrophils Relative %: 55 %
Platelets: 195 10*3/uL (ref 150–400)
RBC: 4.79 MIL/uL (ref 3.87–5.11)
RDW: 12.8 % (ref 11.5–15.5)
WBC: 5.9 10*3/uL (ref 4.0–10.5)
nRBC: 0 % (ref 0.0–0.2)

## 2020-11-29 LAB — TYPE AND SCREEN
ABO/RH(D): A POS
Antibody Screen: NEGATIVE

## 2020-11-29 LAB — GLUCOSE, CAPILLARY: Glucose-Capillary: 97 mg/dL (ref 70–99)

## 2020-11-29 LAB — SURGICAL PCR SCREEN
MRSA, PCR: NEGATIVE
Staphylococcus aureus: NEGATIVE

## 2020-11-29 LAB — APTT: aPTT: 30 seconds (ref 24–36)

## 2020-11-29 LAB — SARS CORONAVIRUS 2 (TAT 6-24 HRS): SARS Coronavirus 2: NEGATIVE

## 2020-11-29 NOTE — Progress Notes (Signed)
PCP - Beverley Fiedler Cardiologist - Denies  PPM/ICD - Denies  Chest x-ray - N/A EKG - N/A Stress Test - Denies ECHO - Denies Cardiac Cath - Denies  Sleep Study - Denies  Per pt, she is diabetic. CBG at PAT appt was 97. A1C obtained.  Blood Thinner Instructions: N/A Aspirin Instructions: N/A  ERAS Protcol - Yes PRE-SURGERY Ensure or G2- 10 oz Water given  COVID TEST- 11/29/20   Anesthesia review: No  Patient denies shortness of breath, fever, cough and chest pain at PAT appointment   All instructions explained to the patient, with a verbal understanding of the material. Patient agrees to go over the instructions while at home for a better understanding. Patient also instructed to self quarantine after being tested for COVID-19. The opportunity to ask questions was provided.

## 2020-11-29 NOTE — Pre-Procedure Instructions (Addendum)
Surgical Instructions:    Your procedure is scheduled on Thursday, June 23rd (12:45 PM- 4:45 PM).  Report to [facility] Main Entrance "A" at 09:45 A.M., then check in with the Admitting office.  Call this number if you have any questions prior to your surgery date, or have problems the morning of surgery:  [phone]    Remember:  Do not eat after midnight the night before your surgery.  You may drink clear liquids until 09:45 AM the morning of your surgery.   Clear liquids allowed are: Water, Non-Citrus Juices (without pulp), Carbonated Beverages, Clear Tea, Black Coffee Only, and Gatorade.  Drink ONE small 10 oz bottle of water by 09:45 AM the morning of surgery. This bottle was given to you during your hospital pre-op appointment visit.  Nothing else to drink after completing the Small 10 oz bottle of water.         If you have questions, please contact your surgeon's office.     Take these medicines the morning of surgery with A SIP OF WATER: atorvastatin (LIPITOR)    IF NEEDED: albuterol (PROVENTIL HFA;VENTOLIN HFA) inhaler   As of today, STOP taking any Aspirin (unless otherwise instructed by your surgeon) Aleve, Naproxen, Ibuprofen, Motrin, Advil, Goody's, BC's, all herbal medications, fish oil, and all vitamins.  WHAT DO I DO ABOUT MY DIABETES MEDICATION? Do not take oral diabetes medicines (pills)-- metFORMIN (GLUCOPHAGE) the morning of surgery.    HOW TO MANAGE YOUR DIABETES BEFORE AND AFTER SURGERY  Why is it important to control my blood sugar before and after surgery? Improving blood sugar levels before and after surgery helps healing and can limit problems. A way of improving blood sugar control is eating a healthy diet by:  Eating less sugar and carbohydrates  Increasing activity/exercise  Talking with your doctor about reaching your blood sugar goals High blood sugars (greater than 180 mg/dL) can raise your risk of infections and slow your recovery, so  you will need to focus on controlling your diabetes during the weeks before surgery. Make sure that the doctor who takes care of your diabetes knows about your planned surgery including the date and location.  How do I manage my blood sugar before surgery? Check your blood sugar at least 4 times a day, starting 2 days before surgery, to make sure that the level is not too high or low.  Check your blood sugar the morning of your surgery when you wake up and every 2 hours until you get to the Short Stay unit.  If your blood sugar is less than 70 mg/dL, you will need to treat for low blood sugar: Treat a low blood sugar (less than 70 mg/dL) with  cup of clear juice (cranberry or apple), 4 glucose tablets, OR glucose gel. Recheck blood sugar in 15 minutes after treatment (to make sure it is greater than 70 mg/dL). If your blood sugar is not greater than 70 mg/dL on recheck, call [phone] for further instructions. Report your blood sugar to the short stay nurse when you get to Short Stay.  If you are admitted to the hospital after surgery: Your blood sugar will be checked by the staff and you will probably be given insulin after surgery (instead of oral diabetes medicines) to make sure you have good blood sugar levels. The goal for blood sugar control after surgery is 80-180 mg/dL.     Special instructions:    [place]- Preparing For Surgery  Before surgery, you can  play an important role. Because skin is not sterile, your skin needs to be as free of germs as possible. You can reduce the number of germs on your skin by washing with CHG (chlorahexidine gluconate) Soap before surgery.  CHG is an antiseptic cleaner which kills germs and bonds with the skin to continue killing germs even after washing.     Please do not use if you have an allergy to CHG or antibacterial soaps. If your skin becomes reddened/irritated stop using the CHG.  Do not shave (including legs and underarms) for at  least 48 hours prior to first CHG shower. It is OK to shave your face.  Please follow these instructions carefully.     Shower the NIGHT BEFORE SURGERY and the MORNING OF SURGERY with CHG Soap.   If you chose to wash your hair, wash your hair first as usual with your normal shampoo. After you shampoo, rinse your hair and body thoroughly to remove the shampoo.  Then [employer] and genitals (private parts) with your normal soap and rinse thoroughly to remove soap.  After that Use CHG Soap as you would any other liquid soap. You can apply CHG directly to the skin and wash gently with a scrungie or a clean washcloth.   Apply the CHG Soap to your body ONLY FROM THE NECK DOWN.  Do not use on open wounds or open sores. Avoid contact with your eyes, ears, mouth and genitals (private parts). Wash Face and genitals (private parts)  with your normal soap.   Wash thoroughly, paying special attention to the area where your surgery will be performed.  Thoroughly rinse your body with warm water from the neck down.  DO NOT shower/wash with your normal soap after using and rinsing off the CHG Soap.  Pat yourself dry with a CLEAN TOWEL.  Wear CLEAN PAJAMAS to bed the night before surgery  Place CLEAN SHEETS on your bed the night before your surgery  DO NOT SLEEP WITH PETS.   Day of Surgery:  Take a shower with CHG soap. Wear Clean/Comfortable clothing the morning of surgery Do not apply any deodorants/lotions.   Remember to brush your teeth WITH YOUR REGULAR TOOTHPASTE. Do not wear jewelry or makeup. DO Not wear nail polish, gel polish, artificial nails, or any other type of covering on natural nails including finger and toenails. If patients have artificial nails, gel coating, etc. that need to be removed by a nail salon please have this removed prior to surgery or surgery may need to be canceled/delayed if the surgeon/ anesthesia feels like the patient is unable to be adequately monitored. Do not  wear lotions, powders, perfumes, or deodorant. Do not shave 48 hours prior to surgery.   Do not bring valuables to the hospital. [facility] is not responsible for any belongings or valuables.  Do NOT Smoke (Tobacco/Vaping) or drink Alcohol 24 hours prior to your procedure.  If you use a CPAP at night, you may bring all equipment for your overnight stay.   Contacts, glasses, dentures or bridgework may not be worn into surgery, please bring cases for these belongings.   For patients admitted to the hospital, discharge time will be determined by your treatment team.   Patients discharged the day of surgery will not be allowed to drive home, and someone needs to stay with them for 24 hours.  ONLY 1 SUPPORT PERSON MAY BE PRESENT WHILE YOU ARE IN SURGERY. IF YOU ARE TO BE ADMITTED ONCE YOU  ARE IN YOUR ROOM YOU WILL BE ALLOWED TWO (2) VISITORS.  Minor children may have two parents present. Special consideration for safety and communication needs will be reviewed on a case by case basis.     Please read over the following fact sheets that you were given.

## 2020-11-30 LAB — HEMOGLOBIN A1C
Hgb A1c MFr Bld: 6 % — ABNORMAL HIGH (ref 4.8–5.6)
Mean Plasma Glucose: 126 mg/dL

## 2020-12-01 ENCOUNTER — Inpatient Hospital Stay (HOSPITAL_COMMUNITY)
Admission: RE | Admit: 2020-12-01 | Discharge: 2020-12-02 | DRG: 455 | Disposition: A | Discharge status: Home / Self Care | Payer: Medicare HMO | Attending: Orthopedic Surgery | Admitting: Orthopedic Surgery

## 2020-12-01 ENCOUNTER — Inpatient Hospital Stay (HOSPITAL_COMMUNITY): Payer: Medicare HMO

## 2020-12-01 ENCOUNTER — Encounter (HOSPITAL_COMMUNITY): Payer: Self-pay | Admitting: Orthopedic Surgery

## 2020-12-01 ENCOUNTER — Inpatient Hospital Stay (HOSPITAL_COMMUNITY)
Admission: RE | Disposition: A | Discharge status: Home / Self Care | Payer: Self-pay | Source: Home / Self Care | Attending: Orthopedic Surgery

## 2020-12-01 ENCOUNTER — Inpatient Hospital Stay (HOSPITAL_COMMUNITY): Payer: Medicare HMO | Admitting: Vascular Surgery

## 2020-12-01 ENCOUNTER — Inpatient Hospital Stay (HOSPITAL_COMMUNITY): Payer: Medicare HMO | Admitting: Certified Registered"

## 2020-12-01 ENCOUNTER — Other Ambulatory Visit: Payer: Self-pay

## 2020-12-01 DIAGNOSIS — M5116 Intervertebral disc disorders with radiculopathy, lumbar region: Principal | ICD-10-CM | POA: Diagnosis present

## 2020-12-01 DIAGNOSIS — M48061 Spinal stenosis, lumbar region without neurogenic claudication: Secondary | ICD-10-CM | POA: Diagnosis present

## 2020-12-01 DIAGNOSIS — Z20822 Contact with and (suspected) exposure to covid-19: Secondary | ICD-10-CM | POA: Diagnosis present

## 2020-12-01 DIAGNOSIS — Z90710 Acquired absence of both cervix and uterus: Secondary | ICD-10-CM | POA: Diagnosis not present

## 2020-12-01 DIAGNOSIS — Z79899 Other long term (current) drug therapy: Secondary | ICD-10-CM

## 2020-12-01 DIAGNOSIS — M62830 Muscle spasm of back: Secondary | ICD-10-CM | POA: Diagnosis present

## 2020-12-01 DIAGNOSIS — M4316 Spondylolisthesis, lumbar region: Secondary | ICD-10-CM | POA: Diagnosis present

## 2020-12-01 DIAGNOSIS — R7303 Prediabetes: Secondary | ICD-10-CM | POA: Diagnosis present

## 2020-12-01 DIAGNOSIS — M5410 Radiculopathy, site unspecified: Secondary | ICD-10-CM | POA: Diagnosis present

## 2020-12-01 DIAGNOSIS — Z7984 Long term (current) use of oral hypoglycemic drugs: Secondary | ICD-10-CM | POA: Diagnosis not present

## 2020-12-01 DIAGNOSIS — Z419 Encounter for procedure for purposes other than remedying health state, unspecified: Secondary | ICD-10-CM

## 2020-12-01 HISTORY — PX: TRANSFORAMINAL LUMBAR INTERBODY FUSION (TLIF) WITH PEDICLE SCREW FIXATION 1 LEVEL: SHX6141

## 2020-12-01 LAB — ABO/RH: ABO/RH(D): A POS

## 2020-12-01 LAB — GLUCOSE, CAPILLARY: Glucose-Capillary: 117 mg/dL — ABNORMAL HIGH (ref 70–99)

## 2020-12-01 SURGERY — TRANSFORAMINAL LUMBAR INTERBODY FUSION (TLIF) WITH PEDICLE SCREW FIXATION 1 LEVEL
Anesthesia: General | Laterality: Left

## 2020-12-01 MED ORDER — BISACODYL 5 MG PO TBEC
5.0000 mg | DELAYED_RELEASE_TABLET | Freq: Every day | ORAL | Status: DC | PRN
Start: 2020-12-01 — End: 2020-12-02

## 2020-12-01 MED ORDER — BUPIVACAINE-EPINEPHRINE (PF) 0.25% -1:200000 IJ SOLN
INTRAMUSCULAR | Status: AC
  Filled 2020-12-01: qty 30

## 2020-12-01 MED ORDER — ORAL CARE MOUTH RINSE: 15.0000 mL | Freq: Once | OROMUCOSAL | Status: AC

## 2020-12-01 MED ORDER — SODIUM CHLORIDE 0.9% FLUSH: 3.0000 mL | Freq: Two times a day (BID) | INTRAVENOUS | Status: DC

## 2020-12-01 MED ORDER — PROPOFOL 10 MG/ML IV BOLUS
INTRAVENOUS | Status: AC
  Filled 2020-12-01: qty 20

## 2020-12-01 MED ORDER — CEFAZOLIN SODIUM 1 G IJ SOLR
INTRAMUSCULAR | Status: AC
  Filled 2020-12-01: qty 20

## 2020-12-01 MED ORDER — METHOCARBAMOL 1000 MG/10ML IJ SOLN
500.0000 mg | Freq: Four times a day (QID) | INTRAVENOUS | Status: DC | PRN
  Filled 2020-12-01: qty 5

## 2020-12-01 MED ORDER — ACETAMINOPHEN 650 MG RE SUPP: 650.0000 mg | RECTAL | Status: DC | PRN

## 2020-12-01 MED ORDER — AMISULPRIDE (ANTIEMETIC) 5 MG/2ML IV SOLN
INTRAVENOUS | Status: AC
  Filled 2020-12-01: qty 4

## 2020-12-01 MED ORDER — ONDANSETRON HCL 4 MG/2ML IJ SOLN
INTRAMUSCULAR | Status: AC
  Filled 2020-12-01: qty 2

## 2020-12-01 MED ORDER — CHLORHEXIDINE GLUCONATE 0.12 % MT SOLN
15.0000 mL | Freq: Once | OROMUCOSAL | Status: AC
  Administered 2020-12-01: 15 mL via OROMUCOSAL
  Filled 2020-12-01: qty 15

## 2020-12-01 MED ORDER — ZOLPIDEM TARTRATE 5 MG PO TABS: 5.0000 mg | ORAL_TABLET | Freq: Every evening | ORAL | Status: DC | PRN

## 2020-12-01 MED ORDER — HYDROMORPHONE HCL 1 MG/ML IJ SOLN
INTRAMUSCULAR | Status: AC
  Filled 2020-12-01: qty 1

## 2020-12-01 MED ORDER — ACETAMINOPHEN 325 MG PO TABS: 650.0000 mg | ORAL_TABLET | ORAL | Status: DC | PRN

## 2020-12-01 MED ORDER — METHOCARBAMOL 500 MG PO TABS
500.0000 mg | ORAL_TABLET | Freq: Four times a day (QID) | ORAL | Status: DC | PRN
  Administered 2020-12-01 – 2020-12-02 (×2): 500 mg via ORAL
  Filled 2020-12-01: qty 1

## 2020-12-01 MED ORDER — METFORMIN HCL 500 MG PO TABS
500.0000 mg | ORAL_TABLET | Freq: Two times a day (BID) | ORAL | Status: DC
  Administered 2020-12-02: 500 mg via ORAL
  Filled 2020-12-01: qty 1

## 2020-12-01 MED ORDER — FLEET ENEMA 7-19 GM/118ML RE ENEM: 1.0000 | ENEMA | Freq: Once | RECTAL | Status: DC | PRN

## 2020-12-01 MED ORDER — POVIDONE-IODINE 7.5 % EX SOLN: Freq: Once | CUTANEOUS | Status: DC

## 2020-12-01 MED ORDER — ACETAMINOPHEN 10 MG/ML IV SOLN
INTRAVENOUS | Status: DC | PRN
  Administered 2020-12-01: 1000 mg via INTRAVENOUS

## 2020-12-01 MED ORDER — OXYCODONE HCL 5 MG/5ML PO SOLN: 5.0000 mg | Freq: Once | ORAL | Status: DC | PRN

## 2020-12-01 MED ORDER — METHOCARBAMOL 500 MG PO TABS
ORAL_TABLET | ORAL | Status: AC
  Filled 2020-12-01: qty 1

## 2020-12-01 MED ORDER — MENTHOL 3 MG MT LOZG
1.0000 | LOZENGE | OROMUCOSAL | Status: DC | PRN
  Filled 2020-12-01: qty 9

## 2020-12-01 MED ORDER — PHENOL 1.4 % MT LIQD: 1.0000 | OROMUCOSAL | Status: DC | PRN

## 2020-12-01 MED ORDER — ATORVASTATIN CALCIUM 40 MG PO TABS
40.0000 mg | ORAL_TABLET | Freq: Every day | ORAL | Status: DC
  Administered 2020-12-02: 40 mg via ORAL
  Filled 2020-12-01: qty 1

## 2020-12-01 MED ORDER — OXYCODONE HCL 5 MG PO TABS: 5.0000 mg | ORAL_TABLET | Freq: Once | ORAL | Status: DC | PRN

## 2020-12-01 MED ORDER — ALBUTEROL SULFATE (2.5 MG/3ML) 0.083% IN NEBU: 2.5000 mg | INHALATION_SOLUTION | Freq: Four times a day (QID) | RESPIRATORY_TRACT | Status: DC | PRN

## 2020-12-01 MED ORDER — VANCOMYCIN HCL 750 MG/150ML IV SOLN
750.0000 mg | Freq: Once | INTRAVENOUS | Status: AC
  Administered 2020-12-01: 750 mg via INTRAVENOUS
  Filled 2020-12-01: qty 150

## 2020-12-01 MED ORDER — PHENYLEPHRINE 40 MCG/ML (10ML) SYRINGE FOR IV PUSH (FOR BLOOD PRESSURE SUPPORT)
PREFILLED_SYRINGE | INTRAVENOUS | Status: DC | PRN
  Administered 2020-12-01 (×4): 80 ug via INTRAVENOUS

## 2020-12-01 MED ORDER — ONDANSETRON HCL 4 MG/2ML IJ SOLN
4.0000 mg | Freq: Once | INTRAMUSCULAR | Status: AC | PRN
  Administered 2020-12-01: 4 mg via INTRAVENOUS

## 2020-12-01 MED ORDER — ALBUTEROL SULFATE HFA 108 (90 BASE) MCG/ACT IN AERS: 2.0000 | INHALATION_SPRAY | Freq: Four times a day (QID) | RESPIRATORY_TRACT | Status: DC | PRN

## 2020-12-01 MED ORDER — LACTATED RINGERS IV SOLN: INTRAVENOUS | Status: DC

## 2020-12-01 MED ORDER — HEMOSTATIC AGENTS (NO CHARGE) OPTIME
TOPICAL | Status: DC | PRN
  Administered 2020-12-01: 1 via TOPICAL

## 2020-12-01 MED ORDER — SODIUM CHLORIDE 0.9% FLUSH: 3.0000 mL | INTRAVENOUS | Status: DC | PRN

## 2020-12-01 MED ORDER — DEXAMETHASONE SODIUM PHOSPHATE 10 MG/ML IJ SOLN
INTRAMUSCULAR | Status: AC
  Filled 2020-12-01: qty 1

## 2020-12-01 MED ORDER — BUPIVACAINE LIPOSOME 1.3 % IJ SUSP
INTRAMUSCULAR | Status: AC
  Filled 2020-12-01: qty 20

## 2020-12-01 MED ORDER — FENTANYL CITRATE (PF) 250 MCG/5ML IJ SOLN
INTRAMUSCULAR | Status: DC | PRN
  Administered 2020-12-01: 50 ug via INTRAVENOUS
  Administered 2020-12-01: 100 ug via INTRAVENOUS
  Administered 2020-12-01: 50 ug via INTRAVENOUS

## 2020-12-01 MED ORDER — MORPHINE SULFATE (PF) 2 MG/ML IV SOLN: 1.0000 mg | INTRAVENOUS | Status: DC | PRN

## 2020-12-01 MED ORDER — PHENYLEPHRINE 40 MCG/ML (10ML) SYRINGE FOR IV PUSH (FOR BLOOD PRESSURE SUPPORT)
PREFILLED_SYRINGE | INTRAVENOUS | Status: AC
  Filled 2020-12-01: qty 20

## 2020-12-01 MED ORDER — PROPOFOL 10 MG/ML IV BOLUS
INTRAVENOUS | Status: DC | PRN
  Administered 2020-12-01: 150 mg via INTRAVENOUS

## 2020-12-01 MED ORDER — ROCURONIUM BROMIDE 10 MG/ML (PF) SYRINGE
PREFILLED_SYRINGE | INTRAVENOUS | Status: DC | PRN
  Administered 2020-12-01: 70 mg via INTRAVENOUS
  Administered 2020-12-01: 30 mg via INTRAVENOUS

## 2020-12-01 MED ORDER — ALUM & MAG HYDROXIDE-SIMETH 200-200-20 MG/5ML PO SUSP: 30.0000 mL | Freq: Four times a day (QID) | ORAL | Status: DC | PRN

## 2020-12-01 MED ORDER — SENNOSIDES-DOCUSATE SODIUM 8.6-50 MG PO TABS: 1.0000 | ORAL_TABLET | Freq: Every evening | ORAL | Status: DC | PRN

## 2020-12-01 MED ORDER — ONDANSETRON HCL 4 MG/2ML IJ SOLN: 4.0000 mg | Freq: Four times a day (QID) | INTRAMUSCULAR | Status: DC | PRN

## 2020-12-01 MED ORDER — MEPERIDINE HCL 25 MG/ML IJ SOLN
INTRAMUSCULAR | Status: AC
  Filled 2020-12-01: qty 1

## 2020-12-01 MED ORDER — VANCOMYCIN HCL IN DEXTROSE 1-5 GM/200ML-% IV SOLN
1000.0000 mg | INTRAVENOUS | Status: AC
  Administered 2020-12-01: 1000 mg via INTRAVENOUS
  Filled 2020-12-01: qty 200

## 2020-12-01 MED ORDER — PHENYLEPHRINE HCL-NACL 10-0.9 MG/250ML-% IV SOLN
INTRAVENOUS | Status: DC | PRN
  Administered 2020-12-01: 30 ug/min via INTRAVENOUS

## 2020-12-01 MED ORDER — ACETAMINOPHEN 10 MG/ML IV SOLN
INTRAVENOUS | Status: AC
  Filled 2020-12-01: qty 100

## 2020-12-01 MED ORDER — SODIUM CHLORIDE 0.9 % IV SOLN: 250.0000 mL | INTRAVENOUS | Status: DC

## 2020-12-01 MED ORDER — LIDOCAINE 2% (20 MG/ML) 5 ML SYRINGE
INTRAMUSCULAR | Status: DC | PRN
  Administered 2020-12-01: 100 mg via INTRAVENOUS

## 2020-12-01 MED ORDER — OXYCODONE-ACETAMINOPHEN 5-325 MG PO TABS
1.0000 | ORAL_TABLET | ORAL | Status: DC | PRN
  Administered 2020-12-01 – 2020-12-02 (×3): 2 via ORAL
  Filled 2020-12-01 (×3): qty 2

## 2020-12-01 MED ORDER — HYDROCODONE-ACETAMINOPHEN 5-325 MG PO TABS
1.0000 | ORAL_TABLET | ORAL | Status: DC | PRN
Start: 2020-12-01 — End: 2020-12-02

## 2020-12-01 MED ORDER — THROMBIN (RECOMBINANT) 20000 UNITS EX SOLR
CUTANEOUS | Status: AC
  Filled 2020-12-01: qty 20000

## 2020-12-01 MED ORDER — MEPERIDINE HCL 25 MG/ML IJ SOLN
6.2500 mg | INTRAMUSCULAR | Status: DC | PRN
  Administered 2020-12-01: 12.5 mg via INTRAVENOUS

## 2020-12-01 MED ORDER — ONDANSETRON HCL 4 MG PO TABS: 4.0000 mg | ORAL_TABLET | Freq: Four times a day (QID) | ORAL | Status: DC | PRN

## 2020-12-01 MED ORDER — METHYLENE BLUE 0.5 % INJ SOLN
INTRAVENOUS | Status: DC | PRN
  Administered 2020-12-01: 1 mL via SUBMUCOSAL

## 2020-12-01 MED ORDER — BUPIVACAINE LIPOSOME 1.3 % IJ SUSP
INTRAMUSCULAR | Status: DC | PRN
  Administered 2020-12-01: 20 mL

## 2020-12-01 MED ORDER — POTASSIUM CHLORIDE IN NACL 20-0.9 MEQ/L-% IV SOLN: INTRAVENOUS | Status: DC

## 2020-12-01 MED ORDER — ROCURONIUM BROMIDE 10 MG/ML (PF) SYRINGE
PREFILLED_SYRINGE | INTRAVENOUS | Status: AC
  Filled 2020-12-01: qty 10

## 2020-12-01 MED ORDER — SUGAMMADEX SODIUM 200 MG/2ML IV SOLN
INTRAVENOUS | Status: DC | PRN
  Administered 2020-12-01: 200 mg via INTRAVENOUS

## 2020-12-01 MED ORDER — DEXAMETHASONE SODIUM PHOSPHATE 10 MG/ML IJ SOLN
INTRAMUSCULAR | Status: DC | PRN
  Administered 2020-12-01: 10 mg via INTRAVENOUS

## 2020-12-01 MED ORDER — ONDANSETRON HCL 4 MG/2ML IJ SOLN: INTRAMUSCULAR | Status: DC | PRN

## 2020-12-01 MED ORDER — BUPIVACAINE-EPINEPHRINE 0.25% -1:200000 IJ SOLN
INTRAMUSCULAR | Status: DC | PRN
  Administered 2020-12-01: 10 mL
  Administered 2020-12-01: 20 mL

## 2020-12-01 MED ORDER — 0.9 % SODIUM CHLORIDE (POUR BTL) OPTIME
TOPICAL | Status: DC | PRN
  Administered 2020-12-01: 1000 mL

## 2020-12-01 MED ORDER — ONDANSETRON HCL 4 MG/2ML IJ SOLN
INTRAMUSCULAR | Status: DC | PRN
  Administered 2020-12-01: 4 mg via INTRAVENOUS

## 2020-12-01 MED ORDER — HYDROMORPHONE HCL 1 MG/ML IJ SOLN
0.2500 mg | INTRAMUSCULAR | Status: DC | PRN
  Administered 2020-12-01: 0.5 mg via INTRAVENOUS

## 2020-12-01 MED ORDER — FENTANYL CITRATE (PF) 250 MCG/5ML IJ SOLN
INTRAMUSCULAR | Status: AC
  Filled 2020-12-01: qty 5

## 2020-12-01 MED ORDER — TRAZODONE HCL 50 MG PO TABS: 50.0000 mg | ORAL_TABLET | Freq: Every evening | ORAL | Status: DC | PRN

## 2020-12-01 MED ORDER — DOCUSATE SODIUM 100 MG PO CAPS
100.0000 mg | ORAL_CAPSULE | Freq: Two times a day (BID) | ORAL | Status: DC
  Administered 2020-12-01: 100 mg via ORAL
  Filled 2020-12-01: qty 1

## 2020-12-01 MED ORDER — LIDOCAINE 2% (20 MG/ML) 5 ML SYRINGE
INTRAMUSCULAR | Status: AC
  Filled 2020-12-01: qty 5

## 2020-12-01 MED ORDER — THROMBIN 20000 UNITS EX SOLR
CUTANEOUS | Status: DC | PRN
  Administered 2020-12-01: 20000 [IU] via TOPICAL

## 2020-12-01 MED ORDER — AMISULPRIDE (ANTIEMETIC) 5 MG/2ML IV SOLN
10.0000 mg | Freq: Once | INTRAVENOUS | Status: AC | PRN
  Administered 2020-12-01: 10 mg via INTRAVENOUS

## 2020-12-01 SURGICAL SUPPLY — 81 items
AGENT HMST KT MTR STRL THRMB (HEMOSTASIS) ×1
APL SKNCLS STERI-STRIP NONHPOA (GAUZE/BANDAGES/DRESSINGS) ×1
BENZOIN TINCTURE PRP APPL 2/3 (GAUZE/BANDAGES/DRESSINGS) ×3 IMPLANT
BONE VIVIGEN FORMABLE 10CC (Bone Implant) ×3 IMPLANT
BUR PRESCISION 1.7 ELITE (BURR) ×3 IMPLANT
BUR ROUND FLUTED 5 RND (BURR) ×2 IMPLANT
BUR ROUND FLUTED 5MM RND (BURR) ×1
CAGE SABLE 10X26 8D (Cage) ×3 IMPLANT
CARTRIDGE OIL MAESTRO DRILL (MISCELLANEOUS) ×1 IMPLANT
CLOSURE STERI-STRIP 1/2X4 (GAUZE/BANDAGES/DRESSINGS) ×1
CLOSURE WOUND 1/2 X4 (GAUZE/BANDAGES/DRESSINGS) ×2
CLSR STERI-STRIP ANTIMIC 1/2X4 (GAUZE/BANDAGES/DRESSINGS) ×2 IMPLANT
CNTNR URN SCR LID CUP LEK RST (MISCELLANEOUS) ×1 IMPLANT
CONT SPEC 4OZ STRL OR WHT (MISCELLANEOUS) ×3
COVER BACK TABLE 60X90IN (DRAPES) ×3 IMPLANT
COVER MAYO STAND STRL (DRAPES) ×6 IMPLANT
COVER SURGICAL LIGHT HANDLE (MISCELLANEOUS) ×3 IMPLANT
COVER WAND RF STERILE (DRAPES) ×3 IMPLANT
DIFFUSER DRILL AIR PNEUMATIC (MISCELLANEOUS) ×3 IMPLANT
DRAPE C-ARM 42X72 X-RAY (DRAPES) ×3 IMPLANT
DRAPE C-ARMOR (DRAPES) ×3 IMPLANT
DRAPE POUCH INSTRU U-SHP 10X18 (DRAPES) ×3 IMPLANT
DRAPE SURG 17X23 STRL (DRAPES) ×12 IMPLANT
DURAPREP 26ML APPLICATOR (WOUND CARE) ×3 IMPLANT
ELECT BLADE 4.0 EZ CLEAN MEGAD (MISCELLANEOUS) ×3
ELECT CAUTERY BLADE 6.4 (BLADE) ×3 IMPLANT
ELECT REM PT RETURN 9FT ADLT (ELECTROSURGICAL) ×3
ELECTRODE BLDE 4.0 EZ CLN MEGD (MISCELLANEOUS) ×1 IMPLANT
ELECTRODE REM PT RTRN 9FT ADLT (ELECTROSURGICAL) ×1 IMPLANT
FILTER STRAW FLUID ASPIR (MISCELLANEOUS) ×3 IMPLANT
GAUZE 4X4 16PLY RFD (DISPOSABLE) ×3 IMPLANT
GAUZE SPONGE 4X4 12PLY STRL (GAUZE/BANDAGES/DRESSINGS) ×3 IMPLANT
GLOVE SRG 8 PF TXTR STRL LF DI (GLOVE) ×1 IMPLANT
GLOVE SURG ENC MOIS LTX SZ7 (GLOVE) ×3 IMPLANT
GLOVE SURG ENC MOIS LTX SZ8 (GLOVE) ×3 IMPLANT
GLOVE SURG UNDER POLY LF SZ7 (GLOVE) ×3 IMPLANT
GLOVE SURG UNDER POLY LF SZ8 (GLOVE) ×3
GOWN STRL REUS W/ TWL LRG LVL3 (GOWN DISPOSABLE) ×2 IMPLANT
GOWN STRL REUS W/ TWL XL LVL3 (GOWN DISPOSABLE) ×1 IMPLANT
GOWN STRL REUS W/TWL LRG LVL3 (GOWN DISPOSABLE) ×6
GOWN STRL REUS W/TWL XL LVL3 (GOWN DISPOSABLE) ×3
IV CATH 14GX2 1/4 (CATHETERS) ×3 IMPLANT
KIT BASIN OR (CUSTOM PROCEDURE TRAY) ×3 IMPLANT
KIT POSITION SURG JACKSON T1 (MISCELLANEOUS) ×3 IMPLANT
KIT TURNOVER KIT B (KITS) ×3 IMPLANT
MARKER SKIN DUAL TIP RULER LAB (MISCELLANEOUS) ×6 IMPLANT
NEEDLE 18GX1X1/2 (RX/OR ONLY) (NEEDLE) ×3 IMPLANT
NEEDLE 22X1 1/2 (OR ONLY) (NEEDLE) ×6 IMPLANT
NEEDLE HYPO 25GX1X1/2 BEV (NEEDLE) ×3 IMPLANT
NEEDLE SPNL 18GX3.5 QUINCKE PK (NEEDLE) ×6 IMPLANT
NS IRRIG 1000ML POUR BTL (IV SOLUTION) ×3 IMPLANT
OIL CARTRIDGE MAESTRO DRILL (MISCELLANEOUS) ×3
PACK LAMINECTOMY ORTHO (CUSTOM PROCEDURE TRAY) ×3 IMPLANT
PACK UNIVERSAL I (CUSTOM PROCEDURE TRAY) ×3 IMPLANT
PAD ARMBOARD 7.5X6 YLW CONV (MISCELLANEOUS) ×6 IMPLANT
PATTIES SURGICAL .5 X1 (DISPOSABLE) ×3 IMPLANT
PATTIES SURGICAL .5X1.5 (GAUZE/BANDAGES/DRESSINGS) ×3 IMPLANT
ROD PRE BENT EXP 40MM (Rod) ×6 IMPLANT
SCREW SET SINGLE INNER (Screw) ×12 IMPLANT
SCREW VIPER CORT FIX 6X35 (Screw) ×12 IMPLANT
SPONGE INTESTINAL PEANUT (DISPOSABLE) ×3 IMPLANT
SPONGE SURGIFOAM ABS GEL 100 (HEMOSTASIS) ×3 IMPLANT
STRIP CLOSURE SKIN 1/2X4 (GAUZE/BANDAGES/DRESSINGS) ×4 IMPLANT
SURGIFLO W/THROMBIN 8M KIT (HEMOSTASIS) ×3 IMPLANT
SUT MNCRL AB 4-0 PS2 18 (SUTURE) ×3 IMPLANT
SUT VIC AB 0 CT1 18XCR BRD 8 (SUTURE) ×1 IMPLANT
SUT VIC AB 0 CT1 8-18 (SUTURE) ×3
SUT VIC AB 1 CT1 18XCR BRD 8 (SUTURE) ×1 IMPLANT
SUT VIC AB 1 CT1 8-18 (SUTURE) ×3
SUT VIC AB 2-0 CT2 18 VCP726D (SUTURE) ×3 IMPLANT
SYR 20ML LL LF (SYRINGE) ×6 IMPLANT
SYR BULB IRRIG 60ML STRL (SYRINGE) ×3 IMPLANT
SYR CONTROL 10ML LL (SYRINGE) ×6 IMPLANT
SYR TB 1ML LUER SLIP (SYRINGE) ×3 IMPLANT
TAP EXPEDIUM DL 4.35 (INSTRUMENTS) ×3 IMPLANT
TAP EXPEDIUM DL 5.0 (INSTRUMENTS) ×3 IMPLANT
TAP EXPEDIUM DL 6.0 (INSTRUMENTS) ×3 IMPLANT
TAPE CLOTH SURG 4X10 WHT LF (GAUZE/BANDAGES/DRESSINGS) ×3 IMPLANT
TRAY FOLEY MTR SLVR 16FR STAT (SET/KITS/TRAYS/PACK) ×3 IMPLANT
WATER STERILE IRR 1000ML POUR (IV SOLUTION) ×3 IMPLANT
YANKAUER SUCT BULB TIP NO VENT (SUCTIONS) ×3 IMPLANT

## 2020-12-01 NOTE — Op Note (Signed)
PATIENT NAME: Nicole Delgado RECORD NO.:   [id number]   DATE OF BIRTH: June 19, 1950   DATE OF PROCEDURE: 12/01/2020                               OPERATIVE REPORT     PREOPERATIVE DIAGNOSES: 1. Bilateral lumbar radiculopathy. 2. L4-5 spinal stenosis. 3. Severe L4-5 degenerative disc disease and L4-5 spondylolisthesis   POSTOPERATIVE DIAGNOSES: 1. Bilateral lumbar radiculopathy. 2. L4-5 spinal stenosis. 3. Severe L4-5 degenerative disc disease and L4-5 spondylolisthesis   PROCEDURES: 1. L4/5 decompression 2. Left-sided L4-5 transforaminal lumbar interbody fusion. 3. Right-sided L4-5 posterolateral fusion. 4. Insertion of interbody device x1 (Globus expandable intervertebral spacer). 5. Placement of segmental posterior instrumentation L4, L5 bilaterally  6. Use of local autograft. 7. Use of morselized allograft - Vivigen 8. Intraoperative use of fluoroscopy.   SURGEON:  Phylliss Bob, MD.   ASSISTANTPricilla Holm, PA-C.   ANESTHESIA:  General endotracheal anesthesia.   COMPLICATIONS:  None.   DISPOSITION:  Stable.   ESTIMATED BLOOD LOSS:  100cc   INDICATIONS FOR SURGERY:  Briefly,  Ms. Bergh is a pleasant 70 -year-old feamle who did present to me with severe and ongoing pain in the right and left legs. I did feel that her  symptoms were secondary to the findings noted above.   The patient failed conservative care and did wish to proceed with the procedure  noted above.   OPERATIVE DETAILS:  On 12/01/2020, the patient was brought to surgery and general endotracheal anesthesia was administered.  The patient was placed prone on a well-padded flat Jackson bed with a spinal frame.  Antibiotics were given and a time-out procedure was performed. The back was prepped and draped in the usual fashion.  A midline incision was made overlying the L4-5 intervertebral spaces.  The fascia was incised at the midline.  The paraspinal musculature was bluntly swept  laterally.  Anatomic landmarks for the pedicles were exposed. Using fluoroscopy, I did cannulate the L4 and L5 pedicles bilaterally, using a medial to lateral cortical trajectory technique.  At this point, 6 x 30 mm screws were placed into the right pedicles, and a 40 mm rod was placed into the tulip heads of the screws, and caps were also placed.  Distraction was then applied across the L4-5 intervertebral space, and the caps were then provisionally tightened.  On the left side, bone wax was placed into the cannulated pedicle holes.  I then proceeded with the decompressive aspect of the procedure at the L4-5 level.  A partial facetectomy was performed bilaterally at L4-5, decompressing the L4-5 intervertebral space.  I was very pleased with the decompression. With an assistant holding medial retraction of the traversing left L5 nerve, I did perform an annulotomy at the posterolateral aspect of the L4-5 intervertebral space.  I then used a series of curettes and pituitary rongeurs to perform a thorough and complete intervertebral diskectomy.  The intervertebral space was then liberally packed with autograft as well as allograft in the form of Vivigen, as was the appropriate-sized intervertebral spacer.  The spacer was then tamped into position in the usual fashion.   This was an expandable spacer, which was expanded to approximately 12 mm in height. I was very pleased with the press-fit of the spacer.  I then placed 6 mm screws on the left at L4 and L5. A 40-mm rod was then placed and caps  were placed. The distraction was then released on the contralateral side.  All caps were then locked.  The wound was copiously irrigated with a total of approximately 3 L prior to placing the bone graft.  Additional autograft and allograft was then packed into the posterolateral gutter on the right side to help aid in the success of the fusion.  The wound was  explored for any undue bleeding and there was no  substantial bleeding encountered.  Gel-Foam was placed over the laminectomy site.  The wound was then closed in layers using #1 Vicryl followed by 2-0 Vicryl, followed by 4-0 Monocryl.  Benzoin and Steri-Strips were applied followed by sterile dressing.      Of note, Pricilla Holm was my assistant throughout surgery, and did aid in retraction, suctioning, placement of the hardware, and closure.       Phylliss Bob, MD

## 2020-12-01 NOTE — Progress Notes (Signed)
Pt doesn't have any drain in place. Will give vanc 750mg  IV x1 tonight.  Onnie Boer, PharmD, BCIDP, AAHIVP, CPP Infectious Disease Pharmacist 12/01/2020 6:28 PM

## 2020-12-01 NOTE — Anesthesia Preprocedure Evaluation (Addendum)
Anesthesia Evaluation  Patient identified by MRN, date of birth, ID band Patient awake    Reviewed: Allergy & Precautions, NPO status , Patient's Chart, lab work & pertinent test results  History of Anesthesia Complications Negative for: history of anesthetic complications  Airway Mallampati: IV  TM Distance: >3 FB Neck ROM: Full  Mouth opening: Limited Mouth Opening  Dental  (+) Teeth Intact, Dental Advisory Given   Pulmonary asthma ,    Pulmonary exam normal        Cardiovascular negative cardio ROS Normal cardiovascular exam     Neuro/Psych  Headaches,    GI/Hepatic negative GI ROS, Neg liver ROS,   Endo/Other  Pre-DM on metformin  Renal/GU negative Renal ROS  negative genitourinary   Musculoskeletal  (+) Arthritis ,   Abdominal   Peds  Hematology negative hematology ROS (+)   Anesthesia Other Findings   Reproductive/Obstetrics                             Anesthesia Physical Anesthesia Plan  ASA: 2  Anesthesia Plan: General   Post-op Pain Management:    Induction: Intravenous  PONV Risk Score and Plan: 3 and Ondansetron, Dexamethasone, Treatment may vary due to age or medical condition and Midazolam  Airway Management Planned: Oral ETT  Additional Equipment: None  Intra-op Plan:   Post-operative Plan: Extubation in OR  Informed Consent: I have reviewed the patients History and Physical, chart, labs and discussed the procedure including the risks, benefits and alternatives for the proposed anesthesia with the patient or authorized representative who has indicated his/her understanding and acceptance.     Dental advisory given  Plan Discussed with:   Anesthesia Plan Comments:         Anesthesia Quick Evaluation

## 2020-12-01 NOTE — Anesthesia Procedure Notes (Signed)
Procedure Name: Intubation Date/Time: 12/01/2020 12:44 PM Performed by: Myna Bright, CRNA Pre-anesthesia Checklist: Patient identified, Emergency Drugs available, Suction available and Patient being monitored Patient Re-evaluated:Patient Re-evaluated prior to induction Oxygen Delivery Method: Circle system utilized Preoxygenation: Pre-oxygenation with 100% oxygen Induction Type: IV induction Ventilation: Mask ventilation without difficulty Laryngoscope Size: Mac and 3 Grade View: Grade I Tube type: Oral Tube size: 7.0 mm Number of attempts: 1 Airway Equipment and Method: Stylet Placement Confirmation: ETT inserted through vocal cords under direct vision, positive ETCO2 and breath sounds checked- equal and bilateral Secured at: 21 cm Tube secured with: Tape Dental Injury: Teeth and Oropharynx as per pre-operative assessment

## 2020-12-01 NOTE — H&P (Signed)
PREOPERATIVE H&P  Chief Complaint: Bilateral leg weakness, low back pain  HPI: Nicole Delgado is a 70 y.o. female who presents with ongoing pain in the back and leg weakness  MRI reveals instability and stenosis at L4/5  Patient has failed multiple forms of conservative care and continues to have pain (see office notes for additional details regarding the patient's full course of treatment)  Past Medical History:  Diagnosis Date   Allergy    Arthritis    hands   Asthma    seasonal   Headache    migraines   Keratoconus    followed by optho at [place], on retinal transplant list, legally blind   Osteoporosis    Pre-diabetes    Past Surgical History:  Procedure Laterality Date   ABDOMINAL HYSTERECTOMY  1995   BIOPSY BREAST Left 12/11/2017   states she was advised biopsy was benign   BREAST EXCISIONAL BIOPSY Bilateral    [id number], 1973, 1990 benign   CATARACT EXTRACTION     COLONOSCOPY W/ POLYPECTOMY     EYE SURGERY Bilateral    11/2019; 05/2020   OPEN REDUCTION INTERNAL FIXATION (ORIF) DISTAL RADIAL FRACTURE Right 02/14/2019   Procedure: RIGHT OPEN REDUCTION INTERNAL FIXATION (ORIF) DISTAL RADIAL FRACTURE WITH REPAIR RECONSTRUCTION AS NECESSARY;  Surgeon: Roseanne Kaufman, MD;  Location: [place];  Service: Orthopedics;  Laterality: Right;   ORIF ELBOW FRACTURE Left 02/14/2019   Procedure: LEFT OPEN REDUCTION INTERNAL FIXATION (ORIF) ELBOW;  Surgeon: Roseanne Kaufman, MD;  Location: [place];  Service: Orthopedics;  Laterality: Left;   rotator cuff surgery Right    Social History   Socioeconomic History   Marital status: Divorced    Spouse name: Not on file   Number of children: Not on file   Years of education: Not on file   Highest education level: Not on file  Occupational History   Occupation: Realtor  Tobacco Use   Smoking status: Never   Smokeless tobacco: Never  Vaping Use   Vaping Use: Former  Substance and Sexual Activity   Alcohol use: No   Drug use: No    Sexual activity: Yes    Birth control/protection: Surgical  Other Topics Concern   Not on file  Social History Narrative   Not on file   Social Determinants of Health   Financial Resource Strain: Not on file  Food Insecurity: Not on file  Transportation Needs: Not on file  Physical Activity: Not on file  Stress: Not on file  Social Connections: Not on file   Family History  Problem Relation Age of Onset   Alzheimer's disease Mother    Heart Problems Father    Heart disease Father    Cancer Brother    Hypertension Sister    Colon cancer Neg Hx    Esophageal cancer Neg Hx    Liver cancer Neg Hx    Pancreatic cancer Neg Hx    Rectal cancer Neg Hx    Stomach cancer Neg Hx    Allergies  Allergen Reactions   Penicillins Anaphylaxis and Hives    Did it involve swelling of the face/tongue/throat, SOB, or low BP? Yes Did it involve sudden or severe rash/hives, skin peeling, or any reaction on the inside of your mouth or nose? Yes Did you need to seek medical attention at a hospital or doctor's office? Yes When did it last happen?      In her 81s If all above answers are "NO", may proceed  with cephalosporin use.    Bee Venom Swelling   Valium [Diazepam] Other (See Comments)    Hallucinations   Prior to Admission medications   Medication Sig Start Date End Date Taking? Authorizing Provider  albuterol (PROVENTIL HFA;VENTOLIN HFA) 108 (90 Base) MCG/ACT inhaler Inhale 2 puffs into the lungs every 6 (six) hours as needed for wheezing or shortness of breath. 11/21/16  Yes Alveda Reasons, MD  atorvastatin (LIPITOR) 40 MG tablet Take 40 mg by mouth daily.   Yes [provider]  metFORMIN (GLUCOPHAGE) 500 MG tablet Take 500 mg by mouth 2 (two) times daily with a meal.   Yes [provider]  traZODone (DESYREL) 50 MG tablet Take 50 mg by mouth at bedtime as needed for sleep.   Yes [provider]     All other systems have been reviewed and were  otherwise negative with the exception of those mentioned in the HPI and as above.  Physical Exam: There were no vitals filed for this visit.  There is no height or weight on file to calculate BMI.  General: Alert, no acute distress Cardiovascular: No pedal edema Respiratory: No cyanosis, no use of accessory musculature Skin: No lesions in the area of chief complaint Neurologic: Sensation intact distally Psychiatric: Patient is competent for consent with normal mood and affect Lymphatic: No axillary or cervical lymphadenopathy   Assessment/Plan: SEVERE ONGOING CHRONIC AXIAL LOW BACK PAIN, WITH BILATERAL LEG WEAKNESS Plan for Procedure(s): LEFT-SIDED LUMBAR 4 - LUMBAR 5 TRANSFORAMINAL LUMBAR INTERBODY FUSION WITH INSTRUMENTATION AND ALLOGRAFT   [name], MD 12/01/2020 6:39 AM

## 2020-12-01 NOTE — Transfer of Care (Signed)
Immediate Anesthesia Transfer of Care Note  Patient: Nicole Delgado  Procedure(s) Performed: LEFT-SIDED LUMBAR 4 - LUMBAR 5 TRANSFORAMINAL LUMBAR INTERBODY FUSION WITH INSTRUMENTATION AND ALLOGRAFT (Left)  Patient Location: PACU  Anesthesia Type:General  Level of Consciousness: oriented, drowsy and patient cooperative  Airway & Oxygen Therapy: Patient Spontanous Breathing and Patient connected to face mask oxygen  Post-op Assessment: Report given to RN and Post -op Vital signs reviewed and stable  Post vital signs: Reviewed  Last Vitals:  Vitals Value Taken Time  BP 146/82 12/01/20 1639  Temp    Pulse 74 12/01/20 1641  Resp 16 12/01/20 1641  SpO2 100 % 12/01/20 1641  Vitals shown include unvalidated device data.  Last Pain:  Vitals:   12/01/20 0959  TempSrc:   PainSc: 5       Patients Stated Pain Goal: 3 (03/47/42 5956)  Complications: No notable events documented.

## 2020-12-02 MED ORDER — OXYCODONE-ACETAMINOPHEN 5-325 MG PO TABS: 1.0000 | ORAL_TABLET | ORAL | 0 refills | Status: DC | PRN

## 2020-12-02 MED ORDER — METHOCARBAMOL 500 MG PO TABS: 500.0000 mg | ORAL_TABLET | Freq: Four times a day (QID) | ORAL | 2 refills | Status: DC | PRN

## 2020-12-02 MED FILL — Thrombin (Recombinant) For Soln 20000 Unit: CUTANEOUS | Qty: 1 | Status: AC

## 2020-12-02 NOTE — Anesthesia Postprocedure Evaluation (Signed)
Anesthesia Post Note  Patient: Nicole Delgado  Procedure(s) Performed: LEFT-SIDED LUMBAR 4 - LUMBAR 5 TRANSFORAMINAL LUMBAR INTERBODY FUSION WITH INSTRUMENTATION AND ALLOGRAFT (Left)     Patient location during evaluation: PACU Anesthesia Type: General Level of consciousness: awake and alert Pain management: pain level controlled Vital Signs Assessment: post-procedure vital signs reviewed and stable Respiratory status: spontaneous breathing, nonlabored ventilation and respiratory function stable Cardiovascular status: blood pressure returned to baseline and stable Postop Assessment: no apparent nausea or vomiting Anesthetic complications: no   No notable events documented.  Last Vitals:  Vitals:   12/02/20 0441 12/02/20 0751  BP: 127/70 (!) 125/58  Pulse: 86 74  Resp: 18 18  Temp: 37.1 C 36.9 C  SpO2: 98% 99%    Last Pain:  Vitals:   12/02/20 0800  TempSrc:   PainSc: 0-No pain                 Lidia Collum

## 2020-12-02 NOTE — Plan of Care (Signed)
  Problem: Safety: Goal: Ability to remain free from injury will improve Outcome: Adequate for Discharge   Problem: Education: Goal: Ability to verbalize activity precautions or restrictions will improve Outcome: Adequate for Discharge Goal: Knowledge of the prescribed therapeutic regimen will improve Outcome: Adequate for Discharge Goal: Understanding of discharge needs will improve Outcome: Adequate for Discharge   Problem: Activity: Goal: Ability to avoid complications of mobility impairment will improve Outcome: Adequate for Discharge Goal: Ability to tolerate increased activity will improve Outcome: Adequate for Discharge Goal: Will remain free from falls Outcome: Adequate for Discharge   Problem: Bowel/Gastric: Goal: Gastrointestinal status for postoperative course will improve Outcome: Adequate for Discharge   Problem: Clinical Measurements: Goal: Ability to maintain clinical measurements within normal limits will improve Outcome: Adequate for Discharge Goal: Postoperative complications will be avoided or minimized Outcome: Adequate for Discharge Goal: Diagnostic test results will improve Outcome: Adequate for Discharge   Problem: Pain Management: Goal: Pain level will decrease Outcome: Adequate for Discharge   Problem: Skin Integrity: Goal: Will show signs of wound healing Outcome: Adequate for Discharge   Problem: Health Behavior/Discharge Planning: Goal: Identification of resources available to assist in meeting health care needs will improve Outcome: Adequate for Discharge   Problem: Bladder/Genitourinary: Goal: Urinary functional status for postoperative course will improve Outcome: Adequate for Discharge   

## 2020-12-02 NOTE — Evaluation (Signed)
Physical Therapy Evaluation Patient Details Name: Nicole Delgado MRN: [id number] DOB: September 19, 1950 Today's Date: 12/02/2020   History of Present Illness  Pt is a 70 y/o F who presents s/p L4-L5 decompression and fusion after presenting with ongoing pain in back and leg weakness. PMH significant for Arthritis, Pre-diabetes, Osteoporosis, and Keratoconus.  Clinical Impression  Patient evaluated by Physical Therapy with no further acute PT needs identified. All education has been completed and the patient has no further questions. Pt was able to demonstrate transfers and ambulation with gross modified independence and RW for support. Pt reports feeling more comfortable with the RW, however feel she will be able to manage well without the RW when she feels more confident. Pt was educated on precautions, brace application/wearing schedule, appropriate activity progression, and car transfer. See below for any follow-up Physical Therapy or equipment needs. PT is signing off. Thank you for this referral.     Follow Up Recommendations No PT follow up;Supervision for mobility/OOB    Equipment Recommendations  Rolling walker with 5" wheels    Recommendations for Other Services       Precautions / Restrictions Precautions Precautions: Back Precaution Booklet Issued: Yes (comment) Precaution Comments: Reviewed handout with pt and she was cued for precautions during functional mobility. Required Braces or Orthoses: Spinal Brace Spinal Brace: Thoracolumbosacral orthotic Restrictions Weight Bearing Restrictions: No      Mobility  Bed Mobility Overal bed mobility: Modified Independent             General bed mobility comments: Pt educated on log roll technique, no assist needed.    Transfers Overall transfer level: Modified independent Equipment used: None             General transfer comment: VC's for wide BOS and improved posture to power-up to full  stand.  Ambulation/Gait Ambulation/Gait assistance: Modified independent (Device/Increase time) Gait Distance (Feet): 400 Feet Assistive device: Rolling walker (2 wheeled) Gait Pattern/deviations: Step-through pattern;Decreased stride length;Trunk flexed Gait velocity: Decreased Gait velocity interpretation: 1.31 - 2.62 ft/sec, indicative of limited community ambulator General Gait Details: VC's for improved posture, closer walker proximity, and forward gaze. No assist required and no unsteadiness noted. Pt reports she feels more comfortable with the RW, however feel she could manage well without it.  Stairs            Wheelchair Mobility    Modified Rankin (Stroke Patients Only)       Balance Overall balance assessment: Mild deficits observed, not formally tested                                           Pertinent Vitals/Pain Pain Assessment: Faces Pain Score: 4  Faces Pain Scale: Hurts a little bit Pain Location: Surgical site Pain Descriptors / Indicators: Discomfort;Grimacing;Sore Pain Intervention(s): Limited activity within patient's tolerance;Monitored during session;Repositioned    Home Living Family/patient expects to be discharged to:: Private residence Living Arrangements: Alone Available Help at Discharge: Family;Friend(s);Available PRN/intermittently Type of Home: House Home Access: Level entry     Home Layout: One level Home Equipment: Shower seat - built in;Toilet riser      Prior Function Level of Independence: Independent               Hand Dominance   Dominant Hand: Right    Extremity/Trunk Assessment   Upper Extremity Assessment Upper Extremity Assessment: Defer to OT  evaluation    Lower Extremity Assessment Lower Extremity Assessment: Generalized weakness (Mild - consistent with pre-op diagnosis)    Cervical / Trunk Assessment Cervical / Trunk Assessment: Other exceptions Cervical / Trunk Exceptions: s/p  surgery  Communication   Communication: No difficulties  Cognition Arousal/Alertness: Awake/alert Behavior During Therapy: WFL for tasks assessed/performed Overall Cognitive Status: Within Functional Limits for tasks assessed                                        General Comments General comments (skin integrity, edema, etc.): VSS on RA. Pt stating that she really feels much better than she was anticipating.    Exercises     Assessment/Plan    PT Assessment Patent does not need any further PT services  PT Problem List         PT Treatment Interventions      PT Goals (Current goals can be found in the Care Plan section)  Acute Rehab PT Goals Patient Stated Goal: To keep feeling good and get back to walking again. PT Goal Formulation: All assessment and education complete, DC therapy    Frequency     Barriers to discharge        Co-evaluation               AM-PAC PT "6 Clicks" Mobility  Outcome Measure Help needed turning from your back to your side while in a flat bed without using bedrails?: None Help needed moving from lying on your back to sitting on the side of a flat bed without using bedrails?: None Help needed moving to and from a bed to a chair (including a wheelchair)?: None Help needed standing up from a chair using your arms (e.g., wheelchair or bedside chair)?: None Help needed to walk in hospital room?: None Help needed climbing 3-5 steps with a railing? : None 6 Click Score: 24    End of Session Equipment Utilized During Treatment: Gait belt;Back brace Activity Tolerance: Patient tolerated treatment well Patient left: with call bell/phone within reach (Sitting EOB) Nurse Communication: Mobility status PT Visit Diagnosis: Unsteadiness on feet (R26.81);Pain Pain - part of body:  (back)    Time: 3729-0211 PT Time Calculation (min) (ACUTE ONLY): 25 min   Charges:   PT Evaluation $PT Eval Low Complexity: 1 Low PT  Treatments $Gait Training: 8-22 mins        Rolinda Roan, PT, DPT Acute Rehabilitation Services Pager: [phone] Office: [phone]   Thelma Comp 12/02/2020, 12:06 PM

## 2020-12-02 NOTE — Evaluation (Signed)
Occupational Therapy Evaluation Patient Details Name: Nicole Delgado MRN: [id number] DOB: [birth year] Today's Date: 12/02/2020    History of Present Illness 70 y.o. F admitted to [facility] for s/p L4-L5 decompression and fusion after presenting with ongoing pain in back and leg weakness. PMH significant for Arthritis, Pre-diabetes, Osteoporosis, and Keratoconus.   Clinical Impression   Pt admitted for procedure listed above. PTA pt reported that she was independent with all ADL's and IADL's, with some furniture walking when her pain is increased. At the time of the evaluation pt demonstrated continued strength and independence with functional mobility and ADL performance. Pt requiring education on compensatory techniques, then she is able to demonstrate what she was educated on with no assist. At this time pt does not need further OT, Acute OT will sign off. Thanks for the referral.     Follow Up Recommendations  No OT follow up    Equipment Recommendations  Other (comment) (RW)    Recommendations for Other Services       Precautions / Restrictions Precautions Precautions: Back Precaution Booklet Issued: Yes (comment) Precaution Comments: Precautions reviewed and log rolling technique educated. Required Braces or Orthoses: Spinal Brace Spinal Brace: Thoracolumbosacral orthotic Restrictions Weight Bearing Restrictions: No      Mobility Bed Mobility Overal bed mobility: Modified Independent             General bed mobility comments: Pt educated on log roll technique, no assist needed.    Transfers Overall transfer level: Modified independent Equipment used: None             General transfer comment: Pt complete sit<>stand x4 from bed in lowest setting, toilet, and standard chair in room.    Balance Overall balance assessment: Mild deficits observed, not formally tested                                         ADL either performed or assessed with  clinical judgement   ADL Overall ADL's : Modified independent;At baseline                                       General ADL Comments: Pt able to complete toitleting, bathing, and dressing with no physical assist, safely. Pt educated on compensatory techniques for dressing and bathing. All questions answered, pt demonstrated all ADL's from seated and/or standing to simulate how she will complete them at home.     Vision Baseline Vision/History: Legally blind;Wears glasses Wears Glasses: At all times Patient Visual Report: No change from baseline Vision Assessment?: No apparent visual deficits     Perception Perception Perception Tested?: No   Praxis Praxis Praxis tested?: Not tested    Pertinent Vitals/Pain Pain Assessment: 0-10 Pain Score: 4  Pain Location: Surgical site Pain Descriptors / Indicators: Discomfort;Grimacing;Sore Pain Intervention(s): Limited activity within patient's tolerance;Monitored during session;Repositioned     Hand Dominance Right   Extremity/Trunk Assessment Upper Extremity Assessment Upper Extremity Assessment: Overall WFL for tasks assessed   Lower Extremity Assessment Lower Extremity Assessment: Defer to PT evaluation   Cervical / Trunk Assessment Cervical / Trunk Assessment: Normal   Communication Communication Communication: No difficulties   Cognition Arousal/Alertness: Awake/alert Behavior During Therapy: WFL for tasks assessed/performed Overall Cognitive Status: Within Functional Limits for tasks assessed  General Comments  VSS on RA. Pt stating that she really feels much better than she was anticipating.    Exercises     Shoulder Instructions      Home Living Family/patient expects to be discharged to:: Private residence Living Arrangements: Alone Available Help at Discharge: Family;Friend(s);Available PRN/intermittently Type of Home: House Home Access:  Level entry     Home Layout: One level     Bathroom Shower/Tub: Occupational psychologist: Standard Bathroom Accessibility: Yes How Accessible: Accessible via walker Home Equipment: Shower seat - built in;Toilet riser          Prior Functioning/Environment Level of Independence: Independent                 OT Problem List: Decreased strength;Decreased activity tolerance;Impaired balance (sitting and/or standing);Impaired vision/perception;Decreased coordination;Decreased safety awareness;Decreased knowledge of use of DME or AE;Decreased knowledge of precautions;Pain      OT Treatment/Interventions: Self-care/ADL training;Therapeutic exercise;DME and/or AE instruction;Therapeutic activities;Patient/family education;Balance training;Energy conservation    OT Goals(Current goals can be found in the care plan section) Acute Rehab OT Goals Patient Stated Goal: To keep feeling good and get back to walking again. OT Goal Formulation: All assessment and education complete, DC therapy Time For Goal Achievement: 12/02/20 Potential to Achieve Goals: Good  OT Frequency: Min 2X/week   Barriers to D/C:            Co-evaluation              AM-PAC OT "6 Clicks" Daily Activity     Outcome Measure Help from another person eating meals?: None Help from another person taking care of personal grooming?: None Help from another person toileting, which includes using toliet, bedpan, or urinal?: None Help from another person bathing (including washing, rinsing, drying)?: None Help from another person to put on and taking off regular upper body clothing?: None Help from another person to put on and taking off regular lower body clothing?: None 6 Click Score: 24   End of Session Equipment Utilized During Treatment: Back brace Nurse Communication: Mobility status  Activity Tolerance: Patient tolerated treatment well Patient left: in chair;with call bell/phone within  reach  OT Visit Diagnosis: Unsteadiness on feet (R26.81);Other abnormalities of gait and mobility (R26.89);Muscle weakness (generalized) (M62.81)                Time: 8366-2947 OT Time Calculation (min): 35 min Charges:  OT General Charges $OT Visit: 1 Visit OT Evaluation $OT Eval Low Complexity: 1 Low OT Treatments $Self Care/Home Management : 8-22 mins  Crystian Frith H., OTR/L Acute Rehabilitation  Kimberely Mccannon Elane Chemere Steffler 12/02/2020, 9:25 AM

## 2020-12-02 NOTE — Progress Notes (Signed)
    Patient doing well  Patient denies leg pain   Physical Exam: Vitals:   12/01/20 2300 12/02/20 0441  BP: (!) 142/52 127/70  Pulse: 62 86  Resp: 18 18  Temp: 98.6 F (37 C) 98.7 F (37.1 C)  SpO2: 100% 98%    Dressing in place NVI  POD #1 s/p L4/5 decompression and fusion, doing well  - up with PT/OT, encourage ambulation - Percocet for pain, Robaxin for muscle spasms - likely d/c home today with f/u in 2 weeks

## 2020-12-02 NOTE — Progress Notes (Signed)
NURSING PROGRESS NOTE  [address] [id number] Discharge Data: 12/02/2020 12:23 PM Attending Provider: Phylliss Bob, MD FSF:[id number], Higinio Roger, FNP     Nicole Delgado to be D/C'd Home per MD order.  Discussed with the patient the After Visit Summary and all questions fully answered. All IV's discontinued with no bleeding noted. All belongings returned to patient for patient to take home.   Last Vital Signs:  Blood pressure (!) 125/58, pulse 74, temperature 98.4 F (36.9 C), temperature source Oral, resp. rate 18, height (P) 5\' 6"  (1.676 m), weight (P) 64.9 kg, SpO2 99 %.  Discharge Medication List Allergies as of 12/02/2020       Reactions   Penicillins Anaphylaxis, Hives   Did it involve swelling of the face/tongue/throat, SOB, or low BP? Yes Did it involve sudden or severe rash/hives, skin peeling, or any reaction on the inside of your mouth or nose? Yes Did you need to seek medical attention at a hospital or doctor's office? Yes When did it last happen?      In her 62s If all above answers are "NO", may proceed with cephalosporin use.   Bee Venom Swelling   Valium [diazepam] Other (See Comments)   Hallucinations        Medication List     TAKE these medications    albuterol 108 (90 Base) MCG/ACT inhaler Commonly known as: VENTOLIN HFA Inhale 2 puffs into the lungs every 6 (six) hours as needed for wheezing or shortness of breath.   atorvastatin 40 MG tablet Commonly known as: LIPITOR Take 40 mg by mouth daily.   metFORMIN 500 MG tablet Commonly known as: GLUCOPHAGE Take 500 mg by mouth 2 (two) times daily with a meal.   methocarbamol 500 MG tablet Commonly known as: ROBAXIN Take 1 tablet (500 mg total) by mouth every 6 (six) hours as needed for muscle spasms.   oxyCODONE-acetaminophen 5-325 MG tablet Commonly known as: PERCOCET/ROXICET Take 1-2 tablets by mouth every 4 (four) hours as needed for severe pain.   traZODone 50 MG tablet Commonly known as:  DESYREL Take 50 mg by mouth at bedtime as needed for sleep.

## 2020-12-04 ENCOUNTER — Emergency Department (HOSPITAL_BASED_OUTPATIENT_CLINIC_OR_DEPARTMENT_OTHER)
Admission: EM | Admit: 2020-12-04 | Discharge: 2020-12-05 | Disposition: A | Discharge status: Home / Self Care | Payer: Medicare HMO | Attending: Emergency Medicine | Admitting: Emergency Medicine

## 2020-12-04 ENCOUNTER — Emergency Department (HOSPITAL_BASED_OUTPATIENT_CLINIC_OR_DEPARTMENT_OTHER): Payer: Medicare HMO

## 2020-12-04 ENCOUNTER — Encounter (HOSPITAL_BASED_OUTPATIENT_CLINIC_OR_DEPARTMENT_OTHER): Payer: Self-pay

## 2020-12-04 DIAGNOSIS — Z7984 Long term (current) use of oral hypoglycemic drugs: Secondary | ICD-10-CM | POA: Diagnosis not present

## 2020-12-04 DIAGNOSIS — R7303 Prediabetes: Secondary | ICD-10-CM | POA: Diagnosis not present

## 2020-12-04 DIAGNOSIS — K5903 Drug induced constipation: Secondary | ICD-10-CM | POA: Diagnosis not present

## 2020-12-04 DIAGNOSIS — R110 Nausea: Secondary | ICD-10-CM | POA: Diagnosis not present

## 2020-12-04 DIAGNOSIS — Z87891 Personal history of nicotine dependence: Secondary | ICD-10-CM | POA: Insufficient documentation

## 2020-12-04 DIAGNOSIS — R509 Fever, unspecified: Secondary | ICD-10-CM

## 2020-12-04 DIAGNOSIS — Z20822 Contact with and (suspected) exposure to covid-19: Secondary | ICD-10-CM | POA: Diagnosis not present

## 2020-12-04 DIAGNOSIS — J45991 Cough variant asthma: Secondary | ICD-10-CM | POA: Diagnosis not present

## 2020-12-04 LAB — CBC WITH DIFFERENTIAL/PLATELET
Abs Immature Granulocytes: 0.03 10*3/uL (ref 0.00–0.07)
Basophils Absolute: 0 10*3/uL (ref 0.0–0.1)
Basophils Relative: 0 %
Eosinophils Absolute: 0 10*3/uL (ref 0.0–0.5)
Eosinophils Relative: 0 %
HCT: 34.5 % — ABNORMAL LOW (ref 36.0–46.0)
Hemoglobin: 10.9 g/dL — ABNORMAL LOW (ref 12.0–15.0)
Immature Granulocytes: 0 %
Lymphocytes Relative: 20 %
Lymphs Abs: 1.8 10*3/uL (ref 0.7–4.0)
MCH: 28.3 pg (ref 26.0–34.0)
MCHC: 31.6 g/dL (ref 30.0–36.0)
MCV: 89.6 fL (ref 80.0–100.0)
Monocytes Absolute: 1 10*3/uL (ref 0.1–1.0)
Monocytes Relative: 11 %
Neutro Abs: 6.2 10*3/uL (ref 1.7–7.7)
Neutrophils Relative %: 69 %
Platelets: 159 10*3/uL (ref 150–400)
RBC: 3.85 MIL/uL — ABNORMAL LOW (ref 3.87–5.11)
RDW: 13.2 % (ref 11.5–15.5)
WBC: 9.1 10*3/uL (ref 4.0–10.5)
nRBC: 0 % (ref 0.0–0.2)

## 2020-12-04 LAB — COMPREHENSIVE METABOLIC PANEL
ALT: 18 U/L (ref 0–44)
AST: 26 U/L (ref 15–41)
Albumin: 3.7 g/dL (ref 3.5–5.0)
Alkaline Phosphatase: 44 U/L (ref 38–126)
Anion gap: 8 (ref 5–15)
BUN: 5 mg/dL — ABNORMAL LOW (ref 8–23)
CO2: 29 mmol/L (ref 22–32)
Calcium: 9 mg/dL (ref 8.9–10.3)
Chloride: 100 mmol/L (ref 98–111)
Creatinine, Ser: 0.71 mg/dL (ref 0.44–1.00)
GFR, Estimated: >60 mL/min (ref >60)
Glucose, Bld: 110 mg/dL — ABNORMAL HIGH (ref 70–99)
Potassium: 4.1 mmol/L (ref 3.5–5.1)
Sodium: 137 mmol/L (ref 135–145)
Total Bilirubin: 0.5 mg/dL (ref 0.3–1.2)
Total Protein: 6.5 g/dL (ref 6.5–8.1)

## 2020-12-04 LAB — URINALYSIS, ROUTINE W REFLEX MICROSCOPIC
Bilirubin Urine: NEGATIVE
Glucose, UA: NEGATIVE mg/dL
Hgb urine dipstick: NEGATIVE
Ketones, ur: NEGATIVE mg/dL
Nitrite: NEGATIVE
Protein, ur: NEGATIVE mg/dL
Specific Gravity, Urine: 1.009 (ref 1.005–1.030)
pH: 5.5 (ref 5.0–8.0)

## 2020-12-04 LAB — RESP PANEL BY RT-PCR (FLU A&B, COVID) ARPGX2
Influenza A by PCR: NEGATIVE
Influenza B by PCR: NEGATIVE
SARS Coronavirus 2 by RT PCR: NEGATIVE

## 2020-12-04 LAB — PROTIME-INR
INR: 1.1 (ref 0.8–1.2)
Prothrombin Time: 14.1 seconds (ref 11.4–15.2)

## 2020-12-04 LAB — LACTIC ACID, PLASMA: Lactic Acid, Venous: 0.9 mmol/L (ref 0.5–1.9)

## 2020-12-04 LAB — SEDIMENTATION RATE: Sed Rate: 66 mm/hr — ABNORMAL HIGH (ref 0–22)

## 2020-12-04 LAB — APTT: aPTT: 32 seconds (ref 24–36)

## 2020-12-04 MED ORDER — SODIUM CHLORIDE 0.9 % IV SOLN: 2.0000 g | Freq: Three times a day (TID) | INTRAVENOUS | Status: DC

## 2020-12-04 MED ORDER — ACETAMINOPHEN 325 MG PO TABS
650.0000 mg | ORAL_TABLET | Freq: Once | ORAL | Status: AC
  Administered 2020-12-04: 650 mg via ORAL
  Filled 2020-12-04: qty 2

## 2020-12-04 MED ORDER — SODIUM CHLORIDE 0.9 % IV SOLN
2.0000 g | Freq: Once | INTRAVENOUS | Status: AC
  Administered 2020-12-04: 2 g via INTRAVENOUS

## 2020-12-04 MED ORDER — VANCOMYCIN HCL 750 MG/150ML IV SOLN
750.0000 mg | Freq: Two times a day (BID) | INTRAVENOUS | Status: DC
  Filled 2020-12-04: qty 150

## 2020-12-04 MED ORDER — DOXYCYCLINE HYCLATE 100 MG PO CAPS: 100.0000 mg | ORAL_CAPSULE | Freq: Two times a day (BID) | ORAL | 0 refills | Status: DC

## 2020-12-04 MED ORDER — VANCOMYCIN HCL IN DEXTROSE 1-5 GM/200ML-% IV SOLN
1000.0000 mg | Freq: Once | INTRAVENOUS | Status: AC
  Administered 2020-12-04: 1000 mg via INTRAVENOUS
  Filled 2020-12-04: qty 200

## 2020-12-04 MED ORDER — FLEET ENEMA 7-19 GM/118ML RE ENEM
1.0000 | ENEMA | Freq: Once | RECTAL | Status: AC
  Administered 2020-12-04: 1 via RECTAL

## 2020-12-04 MED ORDER — METRONIDAZOLE 500 MG/100ML IV SOLN
500.0000 mg | Freq: Once | INTRAVENOUS | Status: AC
  Administered 2020-12-04: 500 mg via INTRAVENOUS
  Filled 2020-12-04: qty 100

## 2020-12-04 MED ORDER — OXYCODONE-ACETAMINOPHEN 5-325 MG PO TABS
1.0000 | ORAL_TABLET | Freq: Once | ORAL | Status: AC
  Administered 2020-12-04: 1 via ORAL
  Filled 2020-12-04: qty 1

## 2020-12-04 MED ORDER — LACTATED RINGERS IV SOLN: INTRAVENOUS | Status: DC

## 2020-12-04 MED ORDER — SODIUM CHLORIDE 0.9 % IV BOLUS (SEPSIS)
1000.0000 mL | Freq: Once | INTRAVENOUS | Status: AC
  Administered 2020-12-04: 1000 mL via INTRAVENOUS

## 2020-12-04 NOTE — ED Triage Notes (Signed)
She states she underwent L4 and L5 fusion surgery Thursday (three days ago). She is here today with c/o constipation which she theorizes is d/t oxycodone. She states her temperature at home today was 101. She is ambulatory and in no distress. She is wearing her back brace.

## 2020-12-04 NOTE — ED Provider Notes (Signed)
[facility] EMERGENCY DEPT Provider Note   CSN: [id number] Arrival date & time: 12/04/20  1724     History Chief Complaint  Patient presents with   Fever   Constipation    Nicole Delgado is a 70 y.o. female.  HPI     70 year old female comes in with chief complaint of fever and constipation.  She is overall quite healthy.  She is 3 days postop from L4-L5 decompression and fusion.  Patient reports that she still has not had a BM since her surgery.  She has taken MiraLAX, tried suppository and has been ambulating.  She is taking pain medications regularly.  She took prune juice today and started having her temperature start rising.  She was also having rigors prior to ED arrival.  Review of system is negative for any URI-like symptoms, headache, neck pain, sore throat, cough, chest pain, shortness of breath, abdominal pain, UTI-like symptoms, vomiting, rash.  Patient does indicate having some nausea.  No worsening pain of the back.  No history of PE, DVT.  Past Medical History:  Diagnosis Date   Allergy    Arthritis    hands   Asthma    seasonal   Headache    migraines   Keratoconus    followed by optho at [facility], on retinal transplant list, legally blind   Osteoporosis    Pre-diabetes     Patient Active Problem List   Diagnosis Date Noted   Radiculopathy 12/01/2020   Radius distal fracture 02/14/2019   Keratoconus 03/27/2017   Cough variant asthma vs UACS  12/25/2016   Osteoporosis 11/06/2014   Vitamin D deficiency 10/17/2014   Hepatic adenoma 04/20/2013   Chronic right shoulder pain 04/20/2013   Insomnia 04/20/2013    Past Surgical History:  Procedure Laterality Date   [place] Left 12/11/2017   states she was advised biopsy was benign   BREAST EXCISIONAL BIOPSY Bilateral    [id number], 1973, 1990 benign   CATARACT EXTRACTION     COLONOSCOPY W/ POLYPECTOMY     EYE SURGERY Bilateral     11/2019; 05/2020   OPEN REDUCTION INTERNAL FIXATION (ORIF) DISTAL RADIAL FRACTURE Right 02/14/2019   Procedure: RIGHT OPEN REDUCTION INTERNAL FIXATION (ORIF) DISTAL RADIAL FRACTURE WITH REPAIR RECONSTRUCTION AS NECESSARY;  Surgeon: Roseanne Kaufman, MD;  Location: [facility];  Service: Orthopedics;  Laterality: Right;   ORIF ELBOW FRACTURE Left 02/14/2019   Procedure: LEFT OPEN REDUCTION INTERNAL FIXATION (ORIF) ELBOW;  Surgeon: Roseanne Kaufman, MD;  Location: [facility];  Service: Orthopedics;  Laterality: Left;   rotator cuff surgery Right      OB History     Gravida  0   Para      Term      Preterm      AB      Living  0      SAB      IAB      Ectopic      Multiple      Live Births              Family History  Problem Relation Age of Onset   Alzheimer's disease Mother    Heart Problems Father    Heart disease Father    Cancer Brother    Hypertension Sister    Colon cancer Neg Hx    Esophageal cancer Neg Hx    Liver cancer Neg Hx  Pancreatic cancer Neg Hx    Rectal cancer Neg Hx    Stomach cancer Neg Hx     Social History   Tobacco Use   Smoking status: Never   Smokeless tobacco: Never  Vaping Use   Vaping Use: Former  Substance Use Topics   Alcohol use: No   Drug use: No    Home Medications Prior to Admission medications   Medication Sig Start Date End Date Taking? Authorizing Provider  doxycycline (VIBRAMYCIN) 100 MG capsule Take 1 capsule (100 mg total) by mouth 2 (two) times daily. 12/04/20  Yes Varney Biles, MD  albuterol (PROVENTIL HFA;VENTOLIN HFA) 108 (90 Base) MCG/ACT inhaler Inhale 2 puffs into the lungs every 6 (six) hours as needed for wheezing or shortness of breath. 11/21/16   Alveda Reasons, MD  atorvastatin (LIPITOR) 40 MG tablet Take 40 mg by mouth daily.    [provider]  metFORMIN (GLUCOPHAGE) 500 MG tablet Take 500 mg by mouth 2 (two) times daily with a meal.    [provider]  methocarbamol (ROBAXIN) 500  MG tablet Take 1 tablet (500 mg total) by mouth every 6 (six) hours as needed for muscle spasms. 12/02/20   Phylliss Bob, MD  oxyCODONE-acetaminophen (PERCOCET/ROXICET) 5-325 MG tablet Take 1-2 tablets by mouth every 4 (four) hours as needed for severe pain. 12/02/20   Phylliss Bob, MD  traZODone (DESYREL) 50 MG tablet Take 50 mg by mouth at bedtime as needed for sleep.    [provider]    Allergies    Penicillins, Bee venom, and Valium [diazepam]  Review of Systems   Review of Systems  Constitutional:  Positive for activity change.  Respiratory:  Negative for cough.   Gastrointestinal:  Positive for constipation.  Genitourinary:  Negative for dysuria.  Allergic/Immunologic: Negative for immunocompromised state.  Neurological:  Negative for headaches.  All other systems reviewed and are negative.  Physical Exam Updated Vital Signs BP 130/60 (BP Location: Right Arm)   Pulse 71   Temp 99.2 F (37.3 C)   Resp 17   Ht 5\' 6"  (1.676 m)   Wt 65.8 kg   SpO2 100%   BMI 23.40 kg/m   Physical Exam Vitals and nursing note reviewed.  Constitutional:      Appearance: She is well-developed.  HENT:     Head: Normocephalic and atraumatic.  Eyes:     Extraocular Movements: Extraocular movements intact.  Cardiovascular:     Rate and Rhythm: Normal rate and regular rhythm.     Heart sounds: Normal heart sounds.  Pulmonary:     Effort: Pulmonary effort is normal. No respiratory distress.  Abdominal:     Palpations: Abdomen is soft.     Tenderness: There is no abdominal tenderness.  Musculoskeletal:     Cervical back: Neck supple.     Comments: Surgical wound site is not revealing any purulence, erythema.  Dressing is in place with some dried blood.  Skin:    General: Skin is warm and dry.  Neurological:     Mental Status: She is alert and oriented to person, place, and time.    ED Results / Procedures / Treatments   Labs (all labs ordered are listed, but only  abnormal results are displayed) Labs Reviewed  COMPREHENSIVE METABOLIC PANEL - Abnormal; Notable for the following components:      Result Value   Glucose, Bld 110 (*)    BUN 5 (*)    All other components within normal  limits  CBC WITH DIFFERENTIAL/PLATELET - Abnormal; Notable for the following components:   RBC 3.85 (*)    Hemoglobin 10.9 (*)    HCT 34.5 (*)    All other components within normal limits  URINALYSIS, ROUTINE W REFLEX MICROSCOPIC - Abnormal; Notable for the following components:   Color, Urine COLORLESS (*)    Leukocytes,Ua TRACE (*)    All other components within normal limits  SEDIMENTATION RATE - Abnormal; Notable for the following components:   Sed Rate 66 (*)    All other components within normal limits  RESP PANEL BY RT-PCR (FLU A&B, COVID) ARPGX2  CULTURE, BLOOD (ROUTINE X 2)  CULTURE, BLOOD (ROUTINE X 2)  URINE CULTURE  LACTIC ACID, PLASMA  PROTIME-INR  APTT  C-REACTIVE PROTEIN    EKG EKG Interpretation  Date/Time:  Sunday December 04 2020 20:01:00 EDT Ventricular Rate:  72 PR Interval:  130 QRS Duration: 87 QT Interval:  362 QTC Calculation: 397 R Axis:   71 Text Interpretation: Sinus rhythm Minimal ST depression, inferior leads No acute changes No significant change since last tracing Confirmed by Varney Biles ([id number]) on 12/04/2020 9:30:43 PM  Radiology DG Chest Port 1 View  Result Date: 12/04/2020 CLINICAL DATA:  Questionable sepsis. EXAM: PORTABLE CHEST 1 VIEW COMPARISON:  July 26, 2017 FINDINGS: The heart size and mediastinal contours are within normal limits. Both lungs are clear. The visualized skeletal structures are unremarkable. IMPRESSION: No active disease. Electronically Signed   By: Dorise Bullion III M.D   On: 12/04/2020 20:39    Procedures Procedures   Medications Ordered in ED Medications  lactated ringers infusion ( Intravenous Stopped 12/04/20 2302)  aztreonam (AZACTAM) 2 g in sodium chloride 0.9 % 100 mL IVPB (has no  administration in time range)  vancomycin (VANCOREADY) IVPB 750 mg/150 mL (has no administration in time range)  sodium chloride 0.9 % bolus 1,000 mL (0 mLs Intravenous Stopped 12/04/20 2036)  aztreonam (AZACTAM) 2 g in sodium chloride 0.9 % 100 mL IVPB (0 g Intravenous Stopped 12/04/20 2044)  metroNIDAZOLE (FLAGYL) IVPB 500 mg (0 mg Intravenous Stopped 12/04/20 2036)  vancomycin (VANCOCIN) IVPB 1000 mg/200 mL premix (0 mg Intravenous Stopped 12/04/20 2157)  acetaminophen (TYLENOL) tablet 650 mg (650 mg Oral Given 12/04/20 1954)  sodium phosphate (FLEET) 7-19 GM/118ML enema 1 enema (1 enema Rectal Given 12/04/20 2341)  oxyCODONE-acetaminophen (PERCOCET/ROXICET) 5-325 MG per tablet 1 tablet (1 tablet Oral Given 12/04/20 2341)    ED Course  I have reviewed the triage vital signs and the nursing notes.  Pertinent labs & imaging results that were available during my care of the patient were reviewed by me and considered in my medical decision making (see chart for details).  Clinical Course as of 12/05/20 0017  Nancy Fetter Dec 04, 2020  2200 Discussed case with Dr. Hardin Negus, orthopedics. We went over patient's presentation of high-grade fever along with recent surgery.  We discussed the clean wound and no other source for infection.  He recommends that patient be discharged from the hospital for now and speak with Dr. Lynann Bologna tomorrow.  This recommendation has been discussed with the patient.  She does want some relief from constipation, so we will give enema now.  Patient is in agreement with the plan.  It is uncertain to me what the source of her fever is.  She is reliable, immunocompetent -and blood cultures, urine cultures have been sent.  There is always a possibility of infected hardware -and I made this clear to the  patient.  She is aware that if her symptoms are getting worse, she will need to return to the ER.  We will start her on Doxy. [AN]    Clinical Course User Index [AN] Varney Biles, MD    MDM Rules/Calculators/A&P                          70 year old female comes in with chief complaint of fever and constipation.  She is 3-day postop from neurosurgical spine procedure.  She was mainly concerned of constipation.  She is passing flatus still, having no nausea, vomiting and her abdomen is soft.  We will continue to manage constipation conservatively.  Pain medication likely contributing along with the effects of anesthesia.  What is concerning to [place] is the fever.  Patient was having some rigors prior to ED arrival and the fever started today as well.  However, history is not suggestive of any clear source of infection and the wound site itself looks fine.  Patient is immunocompetent, vaccinated against COVID-19.  We will initiate sepsis work-up and start antibiotics for questionable sepsis.  Final Clinical Impression(s) / ED Diagnoses Final diagnoses:  Fever, unknown origin  Drug-induced constipation    Rx / DC Orders ED Discharge Orders          Ordered    doxycycline (VIBRAMYCIN) 100 MG capsule  2 times daily        12/04/20 2355             Varney Biles, MD 12/05/20 0017

## 2020-12-04 NOTE — Discharge Instructions (Addendum)
You are seen in the ER for constipation.  You were also noted to have a fever.  Given that you are 3 days after surgery, there is always a risk for infection that is not clearly obvious on our evaluation.  We had discussed your case with Dr. Laurena Bering team, they want you to call them tomorrow with an update. We will put you on doxycycline -take it as prescribed. Return to the ER immediately if you start having worsening fevers, chills, confusion, severe nausea and vomiting.  Make sure you use the incentive spirometer as recommended.

## 2020-12-04 NOTE — Progress Notes (Signed)
Pharmacy Antibiotic Note  Nicole Delgado is a 70 y.o. female admitted on 12/04/2020 with  infection of unknown source .  Pharmacy has been consulted for aztreonam and vancomcyin dosing.  WBC and SCr wnl   Plan: -Aztreonam 2 gm IV Q 8 hours -Vancomycin 1250 mg IV load followed by Vancomycin 750 mg IV Q 12 hrs. Goal AUC 400-550. Expected AUC: 533 SCr used: 0.71 -Monitor CBC, renal fx, cultures and clinical progress -Vanc levels at steady state       Temp (24hrs), Avg:101.2 F (38.4 C), Min:101.2 F (38.4 C), Max:101.2 F (38.4 C)  Recent Labs  Lab 11/29/20 1123  WBC 5.9  CREATININE 0.76    Estimated Creatinine Clearance: 59.5 mL/min (by C-G formula based on SCr of 0.76 mg/dL).    Allergies  Allergen Reactions   Penicillins Anaphylaxis and Hives    Did it involve swelling of the face/tongue/throat, SOB, or low BP? Yes Did it involve sudden or severe rash/hives, skin peeling, or any reaction on the inside of your mouth or nose? Yes Did you need to seek medical attention at a hospital or doctor's office? Yes When did it last happen?      In her 63s If all above answers are "NO", may proceed with cephalosporin use.    Bee Venom Swelling   Valium [Diazepam] Other (See Comments)    Hallucinations    Antimicrobials this admission: Aztreonam 6/26 >>  Vancomycin 6/26 >>   Dose adjustments this admission:   Microbiology results: 6/26 BCx:  6/26 UCx:     Thank you for allowing pharmacy to be a part of this patient's care.  Albertina Parr, PharmD., BCPS, BCCCP Clinical Pharmacist Please refer to [facility] for unit-specific pharmacist

## 2020-12-05 ENCOUNTER — Encounter (HOSPITAL_COMMUNITY): Payer: Self-pay | Admitting: Orthopedic Surgery

## 2020-12-05 LAB — C-REACTIVE PROTEIN: CRP: 13.4 mg/dL — ABNORMAL HIGH (ref <1.0)

## 2020-12-05 NOTE — ED Notes (Signed)
Pt verbalizes understanding of discharge instructions. Opportunity for questioning and answers were provided. Armand removed by staff, pt discharged from ED to home. Educated to pick up Rx and neuro will be following up.

## 2020-12-05 NOTE — ED Notes (Signed)
Pt had a large BM at this time.

## 2020-12-06 LAB — URINE CULTURE: Culture: NO GROWTH

## 2020-12-08 NOTE — Discharge Summary (Signed)
Patient ID: Nicole Delgado MRN: [id number] DOB/AGE: 1951-05-31 70 y.o.  Admit date: 12/01/2020 Discharge date: 12/02/2020  Admission Diagnoses:  Active Problems:   Radiculopathy   Discharge Diagnoses:  Same  Past Medical History:  Diagnosis Date   Allergy    Arthritis    hands   Asthma    seasonal   Headache    migraines   Keratoconus    followed by optho at [place], on retinal transplant list, legally blind   Osteoporosis    Pre-diabetes     Surgeries: Procedure(s): LEFT-SIDED LUMBAR 4 - LUMBAR 5 TRANSFORAMINAL LUMBAR INTERBODY FUSION WITH INSTRUMENTATION AND ALLOGRAFT on 12/01/2020   Consultants: None  Discharged Condition: Improved  Hospital Course: Nicole Delgado is an 70 y.o. female who was admitted 12/01/2020 for operative treatment of radiculopathy. Patient has severe unremitting pain that affects sleep, daily activities, and work/hobbies. After pre-op clearance the patient was taken to the operating room on 12/01/2020 and underwent  Procedure(s): LEFT-SIDED LUMBAR 4 - LUMBAR 5 TRANSFORAMINAL LUMBAR INTERBODY FUSION WITH INSTRUMENTATION AND ALLOGRAFT.    Patient was given perioperative antibiotics:  Anti-infectives (From admission, onward)    Start     Dose/Rate Route Frequency Ordered Stop   12/02/20 0000  vancomycin (VANCOREADY) IVPB 750 mg/150 mL        750 mg 150 mL/hr over 60 Minutes Intravenous  Once 12/01/20 1828 12/02/20 0029   12/01/20 0915  vancomycin (VANCOCIN) IVPB 1000 mg/200 mL premix        1,000 mg 200 mL/hr over 60 Minutes Intravenous On call to O.R. 12/01/20 0906 12/01/20 1657        Patient was given sequential compression devices, early ambulation to prevent DVT.  Patient benefited maximally from hospital stay and there were no complications.    Recent vital signs: BP (!) 125/58 (BP Location: Left Arm)   Pulse 74   Temp 98.4 F (36.9 C) (Oral)   Resp 18   Ht (P) 5\' 6"  (1.676 m)   Wt (P) 64.9 kg   SpO2 99%   BMI (P) 23.08 kg/m     Discharge Medications:   Allergies as of 12/02/2020       Reactions   Penicillins Anaphylaxis, Hives   Did it involve swelling of the face/tongue/throat, SOB, or low BP? Yes Did it involve sudden or severe rash/hives, skin peeling, or any reaction on the inside of your mouth or nose? Yes Did you need to seek medical attention at a hospital or doctor's office? Yes When did it last happen?      In her 24s If all above answers are "NO", may proceed with cephalosporin use.   Bee Venom Swelling   Valium [diazepam] Other (See Comments)   Hallucinations        Medication List     TAKE these medications    albuterol 108 (90 Base) MCG/ACT inhaler Commonly known as: VENTOLIN HFA Inhale 2 puffs into the lungs every 6 (six) hours as needed for wheezing or shortness of breath.   atorvastatin 40 MG tablet Commonly known as: LIPITOR Take 40 mg by mouth daily.   metFORMIN 500 MG tablet Commonly known as: GLUCOPHAGE Take 500 mg by mouth 2 (two) times daily with a meal.   methocarbamol 500 MG tablet Commonly known as: ROBAXIN Take 1 tablet (500 mg total) by mouth every 6 (six) hours as needed for muscle spasms.   oxyCODONE-acetaminophen 5-325 MG tablet Commonly known as: PERCOCET/ROXICET Take 1-2 tablets by mouth  every 4 (four) hours as needed for severe pain.   traZODone 50 MG tablet Commonly known as: DESYREL Take 50 mg by mouth at bedtime as needed for sleep.        Diagnostic Studies: DG Lumbar Spine 2-3 Views  Result Date: 12/01/2020 CLINICAL DATA:  Elective surgical: EXAM: LUMBAR SPINE - 2-3 VIEW; DG C-ARM 1-60 MIN COMPARISON:  Radiograph 10/26/2020 confirming 5 lumbar type vertebra. FINDINGS: Three fluoroscopic spot views obtained of the lumbar spine in frontal and lateral projections in the operating room. Posterior rod with intrapedicular screw fusion at L4-L5 with interbody spacer in place. Fluoroscopy time 45.9 seconds. Dose 18.79 mGy. IMPRESSION: Intraoperative  fluoroscopy for L4-L5 fusion. Electronically Signed   By: Keith Rake M.D.   On: 12/01/2020 16:09   DG Lumbar Spine 1 View  Result Date: 12/01/2020 CLINICAL DATA:  70 year old female with lumbar fusion. EXAM: LUMBAR SPINE - 1 VIEW COMPARISON:  None. FINDINGS: Single lateral view of the lumbar spine provided. Two localizer markers noted. The upper marker is at the level of the L2 spinous process and the lower marker is at the level of L4 spinous process. IMPRESSION: Lumbar localizers at the level of L2 and L4 spinous processes. Electronically Signed   By: Anner Crete M.D.   On: 12/01/2020 15:29   DG Chest Port 1 View  Result Date: 12/04/2020 CLINICAL DATA:  Questionable sepsis. EXAM: PORTABLE CHEST 1 VIEW COMPARISON:  July 26, 2017 FINDINGS: The heart size and mediastinal contours are within normal limits. Both lungs are clear. The visualized skeletal structures are unremarkable. IMPRESSION: No active disease. Electronically Signed   By: Dorise Bullion III M.D   On: 12/04/2020 20:39   DG C-Arm 1-60 Min  Result Date: 12/01/2020 CLINICAL DATA:  Elective surgical: EXAM: LUMBAR SPINE - 2-3 VIEW; DG C-ARM 1-60 MIN COMPARISON:  Radiograph 10/26/2020 confirming 5 lumbar type vertebra. FINDINGS: Three fluoroscopic spot views obtained of the lumbar spine in frontal and lateral projections in the operating room. Posterior rod with intrapedicular screw fusion at L4-L5 with interbody spacer in place. Fluoroscopy time 45.9 seconds. Dose 18.79 mGy. IMPRESSION: Intraoperative fluoroscopy for L4-L5 fusion. Electronically Signed   By: Keith Rake M.D.   On: 12/01/2020 16:09    Disposition: Discharge disposition: 01-Home or Self Care        POD #1 s/p L4/5 decompression and fusion, doing well   - up with PT/OT, encourage ambulation - Percocet for pain, Robaxin for muscle spasms -Scripts for pain sent to pharmacy electronically  -D/C instructions sheet printed and in chart -D/C today   -F/U in office 2 weeks   Signed: Lennie Muckle Andrick Rust 12/08/2020, 11:09 AM

## 2020-12-10 LAB — CULTURE, BLOOD (ROUTINE X 2)
Culture: NO GROWTH
Culture: NO GROWTH
Special Requests: ADEQUATE
Special Requests: ADEQUATE

## 2020-12-14 ENCOUNTER — Encounter (HOSPITAL_COMMUNITY): Payer: Self-pay | Admitting: Orthopedic Surgery

## 2021-01-31 ENCOUNTER — Other Ambulatory Visit: Payer: Self-pay | Admitting: Obstetrics and Gynecology

## 2021-01-31 DIAGNOSIS — R928 Other abnormal and inconclusive findings on diagnostic imaging of breast: Secondary | ICD-10-CM

## 2021-02-19 ENCOUNTER — Encounter: Payer: Self-pay | Admitting: Gastroenterology

## 2021-02-20 ENCOUNTER — Other Ambulatory Visit: Payer: Self-pay

## 2021-02-20 ENCOUNTER — Ambulatory Visit: Payer: Medicare HMO

## 2021-02-20 ENCOUNTER — Ambulatory Visit
Admission: RE | Admit: 2021-02-20 | Discharge: 2021-02-20 | Disposition: A | Discharge status: Home / Self Care | Payer: Medicare HMO | Source: Ambulatory Visit | Attending: Obstetrics and Gynecology | Admitting: Obstetrics and Gynecology

## 2021-02-20 DIAGNOSIS — R928 Other abnormal and inconclusive findings on diagnostic imaging of breast: Secondary | ICD-10-CM

## 2021-06-14 ENCOUNTER — Ambulatory Visit (AMBULATORY_SURGERY_CENTER): Payer: Self-pay | Admitting: *Deleted

## 2021-06-14 ENCOUNTER — Other Ambulatory Visit: Payer: Self-pay

## 2021-06-14 VITALS — Ht 66.0 in | Wt 153.2 lb

## 2021-06-14 DIAGNOSIS — Z86010 Personal history of colonic polyps: Secondary | ICD-10-CM

## 2021-06-14 NOTE — Progress Notes (Signed)
No egg or soy allergy known to patient  No issues known to pt with past sedation with any surgeries or procedures Patient denies ever being told they had issues or difficulty with intubation  No FH of Malignant Hyperthermia Pt is not on diet pills Pt is not on  home 02  Pt is not on blood thinners  Pt denies issues with constipation  No A fib or A flutter  Pt is fully vaccinated  for Covid   Discussed with pt there will be an out-of-pocket cost for prep and that varies from $0 to 70 +  dollars - pt verbalized understanding   Due to the COVID-19 pandemic we are asking patients to follow certain guidelines in [place] and the [place]   Pt aware of COVID protocols and [facility] guidelines    During pre-visit pt. Informed me that she no longer takes metformin,when asked if she stopped or if dr. Evelina Bucy her off she said "her dr is aware.

## 2021-06-16 ENCOUNTER — Encounter: Payer: Self-pay | Admitting: Gastroenterology

## 2021-06-21 ENCOUNTER — Other Ambulatory Visit: Payer: Self-pay

## 2021-06-21 ENCOUNTER — Encounter: Payer: Self-pay | Admitting: Gastroenterology

## 2021-06-21 ENCOUNTER — Ambulatory Visit (AMBULATORY_SURGERY_CENTER): Payer: Medicare HMO | Admitting: Gastroenterology

## 2021-06-21 VITALS — BP 131/72 | HR 65 | Temp 97.8°F | Resp 13 | Ht 66.0 in | Wt 153.2 lb

## 2021-06-21 DIAGNOSIS — D123 Benign neoplasm of transverse colon: Secondary | ICD-10-CM

## 2021-06-21 DIAGNOSIS — Z86010 Personal history of colonic polyps: Secondary | ICD-10-CM | POA: Diagnosis not present

## 2021-06-21 MED ORDER — SODIUM CHLORIDE 0.9 % IV SOLN: 500.0000 mL | Freq: Once | INTRAVENOUS | Status: DC

## 2021-06-21 NOTE — Patient Instructions (Signed)
Handouts Provided:  Polyps  YOU HAD AN ENDOSCOPIC PROCEDURE TODAY AT THE [facility]:   Refer to the procedure report that was given to you for any specific questions about what was found during the examination.  If the procedure report does not answer your questions, please call your gastroenterologist to clarify.  If you requested that your care partner not be given the details of your procedure findings, then the procedure report has been included in a sealed envelope for you to review at your convenience later.  YOU SHOULD EXPECT: Some feelings of bloating in the abdomen. Passage of more gas than usual.  Walking can help get rid of the air that was put into your GI tract during the procedure and reduce the bloating. If you had a lower endoscopy (such as a colonoscopy or flexible sigmoidoscopy) you may notice spotting of blood in your stool or on the toilet paper. If you underwent a bowel prep for your procedure, you may not have a normal bowel movement for a few days.  Please Note:  You might notice some irritation and congestion in your nose or some drainage.  This is from the oxygen used during your procedure.  There is no need for concern and it should clear up in a day or so.  SYMPTOMS TO REPORT IMMEDIATELY:   Following lower endoscopy (colonoscopy or flexible sigmoidoscopy):  Excessive amounts of blood in the stool  Significant tenderness or worsening of abdominal pains  Swelling of the abdomen that is new, acute  Fever of 100F or higher  For urgent or emergent issues, a gastroenterologist can be reached at any hour by calling [phone]. Do not use MyChart messaging for urgent concerns.    DIET:  We do recommend a small meal at first, but then you may proceed to your regular diet.  Drink plenty of fluids but you should avoid alcoholic beverages for 24 hours.  ACTIVITY:  You should plan to take it easy for the rest of today and you should NOT DRIVE or use heavy  machinery until tomorrow (because of the sedation medicines used during the test).    FOLLOW UP: Our staff will call the number listed on your records 48-72 hours following your procedure to check on you and address any questions or concerns that you may have regarding the information given to you following your procedure. If we do not reach you, we will leave a message.  We will attempt to reach you two times.  During this call, we will ask if you have developed any symptoms of COVID 19. If you develop any symptoms (ie: fever, flu-like symptoms, shortness of breath, cough etc.) before then, please call [phone].  If you test positive for Covid 19 in the 2 weeks post procedure, please call and report this information to us.    If any biopsies were taken you will be contacted by phone or by letter within the next 1-3 weeks.  Please call us at [phone] if you have not heard about the biopsies in 3 weeks.    SIGNATURES/CONFIDENTIALITY: You and/or your care partner have signed paperwork which will be entered into your electronic medical record.  These signatures attest to the fact that that the information above on your After Visit Summary has been reviewed and is understood.  Full responsibility of the confidentiality of this discharge information lies with you and/or your care-partner.  

## 2021-06-21 NOTE — Progress Notes (Signed)
[facility] History and Physical   Primary Care Physician:  Beverley Fiedler, FNP   Reason for Procedure:   History of colon polyps  Plan:    colonoscopy     HPI: Nicole Delgado is a 71 y.o. female  here for colonoscopy surveillance - 4 adenomas removed 12/2017. Patient denies any bowel symptoms at this time. No family history of colon cancer known. Otherwise feels well without any cardiopulmonary symptoms.    Past Medical History:  Diagnosis Date   Allergy    Arthritis    hands   Asthma    seasonal   Cataract    bilateral removed 2022   Headache    migraines   Keratoconus    followed by optho at [place], on retinal transplant list, legally blind   Osteoporosis    Pre-diabetes     Past Surgical History:  Procedure Laterality Date   [place] Left 12/11/2017   states she was advised biopsy was benign   BREAST BIOPSY Left 12/11/2017   BREAST EXCISIONAL BIOPSY Bilateral    [id number], 1973, 1990 benign   CATARACT EXTRACTION     COLONOSCOPY W/ POLYPECTOMY     EYE SURGERY Bilateral    11/2019; 05/2020   OPEN REDUCTION INTERNAL FIXATION (ORIF) DISTAL RADIAL FRACTURE Right 02/14/2019   Procedure: RIGHT OPEN REDUCTION INTERNAL FIXATION (ORIF) DISTAL RADIAL FRACTURE WITH REPAIR RECONSTRUCTION AS NECESSARY;  Surgeon: Roseanne Kaufman, MD;  Location: [facility];  Service: Orthopedics;  Laterality: Right;   ORIF ELBOW FRACTURE Left 02/14/2019   Procedure: LEFT OPEN REDUCTION INTERNAL FIXATION (ORIF) ELBOW;  Surgeon: Roseanne Kaufman, MD;  Location: [facility];  Service: Orthopedics;  Laterality: Left;   rotator cuff surgery Right    TRANSFORAMINAL LUMBAR INTERBODY FUSION (TLIF) WITH PEDICLE SCREW FIXATION 1 LEVEL Left 12/01/2020   Procedure: LEFT-SIDED LUMBAR 4 - LUMBAR 5 TRANSFORAMINAL LUMBAR INTERBODY FUSION WITH INSTRUMENTATION AND ALLOGRAFT;  Surgeon: Phylliss Bob, MD;  Location: [facility];  Service: Orthopedics;   Laterality: Left;    Prior to Admission medications   Medication Sig Start Date End Date Taking? Authorizing Provider  albuterol (PROVENTIL HFA;VENTOLIN HFA) 108 (90 Base) MCG/ACT inhaler Inhale 2 puffs into the lungs every 6 (six) hours as needed for wheezing or shortness of breath. 11/21/16  Yes Alveda Reasons, MD  atorvastatin (LIPITOR) 40 MG tablet Take 40 mg by mouth daily.   Yes [provider]  traZODone (DESYREL) 50 MG tablet Take 50 mg by mouth at bedtime as needed for sleep.   Yes [provider]  Cholecalciferol (VITAMIN D3 PO) Vitamin D    [provider]  denosumab (PROLIA) 60 MG/ML SOSY injection Inject into the skin.    [provider]  EPINEPHrine 0.3 mg/0.3 mL IJ SOAJ injection Inject into the muscle. Patient not taking: Reported on 06/14/2021 02/02/21   [provider]  metFORMIN (GLUCOPHAGE) 500 MG tablet Take 500 mg by mouth 2 (two) times daily with a meal. Patient not taking: Reported on 06/14/2021    [provider]    Current Outpatient Medications  Medication Sig Dispense Refill   albuterol (PROVENTIL HFA;VENTOLIN HFA) 108 (90 Base) MCG/ACT inhaler Inhale 2 puffs into the lungs every 6 (six) hours as needed for wheezing or shortness of breath. 1 Inhaler 0   atorvastatin (LIPITOR) 40 MG tablet Take 40 mg by mouth daily.     traZODone (DESYREL) 50 MG tablet Take 50  mg by mouth at bedtime as needed for sleep.     Cholecalciferol (VITAMIN D3 PO) Vitamin D     denosumab (PROLIA) 60 MG/ML SOSY injection Inject into the skin.     EPINEPHrine 0.3 mg/0.3 mL IJ SOAJ injection Inject into the muscle. (Patient not taking: Reported on 06/14/2021)     metFORMIN (GLUCOPHAGE) 500 MG tablet Take 500 mg by mouth 2 (two) times daily with a meal. (Patient not taking: Reported on 06/14/2021)     Current Facility-Administered Medications  Medication Dose Route Frequency Provider Last Rate Last Admin   0.9 %  sodium chloride infusion  500 mL  Intravenous Once Bryce Kimble, Carlota Raspberry, MD        Allergies as of 06/21/2021 - Review Complete 06/21/2021  Allergen Reaction Noted   Penicillins Anaphylaxis and Hives 05/13/2012   Bee venom Swelling 10/23/2012   Valium [diazepam] Other (See Comments) 05/13/2012    Family History  Problem Relation Age of Onset   Alzheimer's disease Mother    Heart Problems Father    Heart disease Father    Hypertension Sister    Cancer Brother    Colon cancer Neg Hx    Esophageal cancer Neg Hx    Liver cancer Neg Hx    Pancreatic cancer Neg Hx    Rectal cancer Neg Hx    Stomach cancer Neg Hx    Colon polyps Neg Hx     Social History   Socioeconomic History   Marital status: Divorced    Spouse name: Not on file   Number of children: Not on file   Years of education: Not on file   Highest education level: Not on file  Occupational History   Occupation: Realtor  Tobacco Use   Smoking status: Never   Smokeless tobacco: Never  Vaping Use   Vaping Use: Never used  Substance and Sexual Activity   Alcohol use: No   Drug use: No   Sexual activity: Yes    Birth control/protection: Surgical  Other Topics Concern   Not on file  Social History Narrative   Not on file   Social Determinants of Health   Financial Resource Strain: Not on file  Food Insecurity: Not on file  Transportation Needs: Not on file  Physical Activity: Not on file  Stress: Not on file  Social Connections: Not on file  Intimate Partner Violence: Not on file    Review of Systems: All other review of systems negative except as mentioned in the HPI.  Physical Exam: Vital signs BP (!) 155/71    Pulse 65    Temp 97.8 F (36.6 C) (Skin)    Resp 16    Ht 5\' 6"  (1.676 m)    Wt 153 lb 3.2 oz (69.5 kg)    SpO2 100%    BMI 24.73 kg/m   General:   Alert,  Well-developed, pleasant and cooperative in NAD Lungs:  Clear throughout to auscultation.   Heart:  Regular rate and rhythm Abdomen:  Soft, nontender and  nondistended.   Neuro/Psych:  Alert and cooperative. Normal mood and affect. A and O x 3  Jolly Mango, MD [facility]

## 2021-06-21 NOTE — Progress Notes (Signed)
Called to room to assist during endoscopic procedure.  Patient ID and intended procedure confirmed with present staff. Received instructions for my participation in the procedure from the performing physician.  

## 2021-06-21 NOTE — Progress Notes (Signed)
Pt's states no medical or surgical changes since previsit or office visit. VS assessed by N.C ?

## 2021-06-21 NOTE — Progress Notes (Signed)
Report given to PACU, vss 

## 2021-06-21 NOTE — Op Note (Signed)
[facility] Patient Name: Nicole Delgado Procedure Date: 06/21/2021 2:15 PM MRN: [id number] Endoscopist: Remo Lipps P. Havery Moros , MD Age: 71 Referring MD:  Date of Birth: 19-May-1951 Gender: Female Account #: [id number] Procedure:                Colonoscopy Indications:              High risk colon cancer surveillance: Personal                            history of colonic polyps - 12/2017 - 4 adenomas Medicines:                Monitored Anesthesia Care Procedure:                Pre-Anesthesia Assessment:                           - Prior to the procedure, a History and Physical                            was performed, and patient medications and                            allergies were reviewed. The patient's tolerance of                            previous anesthesia was also reviewed. The risks                            and benefits of the procedure and the sedation                            options and risks were discussed with the patient.                            All questions were answered, and informed consent                            was obtained. Prior Anticoagulants: The patient has                            taken no previous anticoagulant or antiplatelet                            agents. ASA Grade Assessment: II - A patient with                            mild systemic disease. After reviewing the risks                            and benefits, the patient was deemed in                            satisfactory condition to undergo the procedure.  After obtaining informed consent, the colonoscope                            was passed under direct vision. Throughout the                            procedure, the patient's blood pressure, pulse, and                            oxygen saturations were monitored continuously. The                            CF [id number] #[id number] was introduced through the anus                            and  advanced to the the cecum, identified by                            appendiceal orifice and ileocecal valve. The                            colonoscopy was performed without difficulty. The                            patient tolerated the procedure well. The quality                            of the bowel preparation was good. The ileocecal                            valve, appendiceal orifice, and rectum were                            photographed. Scope In: 2:24:22 PM Scope Out: 2:46:06 PM Scope Withdrawal Time: 0 hours 16 minutes 46 seconds  Total Procedure Duration: 0 hours 21 minutes 44 seconds  Findings:                 The perianal and digital rectal examinations were                            normal.                           A single medium-mouthed diverticulum was found in                            the ascending colon.                           A diminutive polyp was found in the ascending                            colon. The polyp was sessile. The polyp was removed  with a cold snare. Resection and retrieval were                            complete.                           Four sessile polyps were found in the transverse                            colon. The polyps were 2 to 3 mm in size. These                            polyps were removed with a cold snare. Resection                            and retrieval were complete.                           The exam was otherwise without abnormality. Poor                            air retention in the left colon. Complications:            No immediate complications. Estimated blood loss:                            Minimal. Estimated Blood Loss:     Estimated blood loss was minimal. Impression:               - Diverticulosis in the ascending colon.                           - One diminutive polyp in the ascending colon,                            removed with a cold snare. Resected and retrieved.                            - Four 2 to 3 mm polyps in the transverse colon,                            removed with a cold snare. Resected and retrieved.                           - The examination was otherwise normal. Recommendation:           - Patient has a contact number available for                            emergencies. The signs and symptoms of potential                            delayed complications were discussed with the  patient. Return to normal activities tomorrow.                            Written discharge instructions were provided to the                            patient.                           - Resume previous diet.                           - Continue present medications.                           - Await pathology results. Remo Lipps P. Jocelyn Lowery, MD 06/21/2021 2:55:09 PM This report has been signed electronically.

## 2021-06-23 ENCOUNTER — Telehealth: Payer: Self-pay

## 2021-06-23 NOTE — Telephone Encounter (Signed)
Follow up call placed, VM obtained and message left. ?SChaplin, RN,BSN ? ?

## 2021-06-23 NOTE — Telephone Encounter (Signed)
°  Follow up Call-  Call back number 06/21/2021  Post procedure Call Back phone  # [phone]  Permission to leave phone message Yes  Some recent data might be hidden     Left message

## 2021-11-25 ENCOUNTER — Emergency Department (HOSPITAL_BASED_OUTPATIENT_CLINIC_OR_DEPARTMENT_OTHER): Payer: Medicare HMO | Admitting: Radiology

## 2021-11-25 ENCOUNTER — Other Ambulatory Visit: Payer: Self-pay

## 2021-11-25 ENCOUNTER — Encounter (HOSPITAL_BASED_OUTPATIENT_CLINIC_OR_DEPARTMENT_OTHER): Payer: Self-pay | Admitting: Emergency Medicine

## 2021-11-25 ENCOUNTER — Emergency Department (HOSPITAL_COMMUNITY): Payer: Medicare HMO

## 2021-11-25 ENCOUNTER — Emergency Department (HOSPITAL_BASED_OUTPATIENT_CLINIC_OR_DEPARTMENT_OTHER)
Admission: EM | Admit: 2021-11-25 | Discharge: 2021-11-25 | Disposition: A | Discharge status: Home / Self Care | Payer: Medicare HMO | Attending: Emergency Medicine | Admitting: Emergency Medicine

## 2021-11-25 ENCOUNTER — Emergency Department (HOSPITAL_BASED_OUTPATIENT_CLINIC_OR_DEPARTMENT_OTHER): Payer: Medicare HMO

## 2021-11-25 DIAGNOSIS — S8992XA Unspecified injury of left lower leg, initial encounter: Secondary | ICD-10-CM | POA: Diagnosis present

## 2021-11-25 DIAGNOSIS — S8012XA Contusion of left lower leg, initial encounter: Secondary | ICD-10-CM | POA: Diagnosis not present

## 2021-11-25 DIAGNOSIS — M79662 Pain in left lower leg: Secondary | ICD-10-CM | POA: Diagnosis not present

## 2021-11-25 DIAGNOSIS — R531 Weakness: Secondary | ICD-10-CM | POA: Insufficient documentation

## 2021-11-25 DIAGNOSIS — R7303 Prediabetes: Secondary | ICD-10-CM | POA: Diagnosis not present

## 2021-11-25 DIAGNOSIS — Z7984 Long term (current) use of oral hypoglycemic drugs: Secondary | ICD-10-CM | POA: Diagnosis not present

## 2021-11-25 DIAGNOSIS — Z79899 Other long term (current) drug therapy: Secondary | ICD-10-CM | POA: Diagnosis not present

## 2021-11-25 DIAGNOSIS — W1839XA Other fall on same level, initial encounter: Secondary | ICD-10-CM | POA: Diagnosis not present

## 2021-11-25 DIAGNOSIS — M259 Joint disorder, unspecified: Secondary | ICD-10-CM

## 2021-11-25 LAB — BASIC METABOLIC PANEL
Anion gap: 7 (ref 5–15)
BUN: 11 mg/dL (ref 8–23)
CO2: 32 mmol/L (ref 22–32)
Calcium: 10.5 mg/dL — ABNORMAL HIGH (ref 8.9–10.3)
Chloride: 100 mmol/L (ref 98–111)
Creatinine, Ser: 0.87 mg/dL (ref 0.44–1.00)
GFR, Estimated: >60 mL/min (ref >60)
Glucose, Bld: 105 mg/dL — ABNORMAL HIGH (ref 70–99)
Potassium: 4.5 mmol/L (ref 3.5–5.1)
Sodium: 139 mmol/L (ref 135–145)

## 2021-11-25 LAB — URINALYSIS, ROUTINE W REFLEX MICROSCOPIC
Bilirubin Urine: NEGATIVE
Glucose, UA: NEGATIVE mg/dL
Hgb urine dipstick: NEGATIVE
Ketones, ur: NEGATIVE mg/dL
Leukocytes,Ua: NEGATIVE
Nitrite: NEGATIVE
Protein, ur: NEGATIVE mg/dL
Specific Gravity, Urine: 1.019 (ref 1.005–1.030)
pH: 6.5 (ref 5.0–8.0)

## 2021-11-25 LAB — CBC
HCT: 42.1 % (ref 36.0–46.0)
Hemoglobin: 13.1 g/dL (ref 12.0–15.0)
MCH: 27.1 pg (ref 26.0–34.0)
MCHC: 31.1 g/dL (ref 30.0–36.0)
MCV: 87 fL (ref 80.0–100.0)
Platelets: 189 10*3/uL (ref 150–400)
RBC: 4.84 MIL/uL (ref 3.87–5.11)
RDW: 13.5 % (ref 11.5–15.5)
WBC: 5.2 10*3/uL (ref 4.0–10.5)
nRBC: 0 % (ref 0.0–0.2)

## 2021-11-25 LAB — CBG MONITORING, ED: Glucose-Capillary: 104 mg/dL — ABNORMAL HIGH (ref 70–99)

## 2021-11-25 NOTE — Discharge Instructions (Addendum)
The knee immobilizer for stability.  Give your orthopedic doctor information provided above call for follow-up.  Doppler study showed no blood clot.  An x-ray of the left knee showed no bony abnormalities.  Could have ligamental injuries the knee immobilizer should provide stability.

## 2021-11-25 NOTE — ED Provider Notes (Addendum)
[facility] EM[facility]ENC[facility] DEPT Provider Note   CSN: [id number] Arrival date & time: 11/25/21  1453     History  Chief Complaint  Patient presents with   Weakness   Fall    Nicole Delgado is a 71 y.o. female.  Patient with a complaint of left knee giving out.  Other times it is fine leg works fine.  Not really associated with pain for the giving out it just gives out.  This resulted in a few falls.  But no significant injuries.  Patient is worried about a bruise on the back of the left calf as well.  Patient does not consider any kind of right-sided weakness.  Did have a little bit of tingling briefly there today.  But everything is working fine and the tingling has resolved.  No acute visual changes.  No headache no speech problems.  Other extremities all working fine.  Patient denies any old injury to the left knee.  Patient does have an orthopedic doctor that she is followed by she had her back operated on.  Patient is involved in a clinical trial at [facility] for normal brain function.  Past medical history significant for osteoporosis prediabetes history of migraines.  Prior surgeries significant rotator cuff surgery on the right abdominal hysterectomy.  Open reduction internal fixation of right distal radial fracture in 2020.  And a lumbar fusion done in 2022 by Dr. Lynann Bologna.       Home Medications Prior to Admission medications   Medication Sig Start Date End Date Taking? Authorizing Provider  albuterol (PROVENTIL HFA;VENTOLIN HFA) 108 (90 Base) MCG/ACT inhaler Inhale 2 puffs into the lungs every 6 (six) hours as needed for wheezing or shortness of breath. 11/21/16   Alveda Reasons, MD  atorvastatin (LIPITOR) 40 MG tablet Take 40 mg by mouth daily.    [provider]  Cholecalciferol (VITAMIN D3 PO) Vitamin D    [provider]  denosumab (PROLIA) 60 MG/ML SOSY injection Inject into the skin.    [provider]  EPINEPHrine 0.3  mg/0.3 mL IJ SOAJ injection Inject into the muscle. Patient not taking: Reported on 06/14/2021 02/02/21   [provider]  metFORMIN (GLUCOPHAGE) 500 MG tablet Take 500 mg by mouth 2 (two) times daily with a meal. Patient not taking: Reported on 06/14/2021    [provider]  traZODone (DESYREL) 50 MG tablet Take 50 mg by mouth at bedtime as needed for sleep.    [provider]      Allergies    Penicillins, Bee venom, and Valium [diazepam]    Review of Systems   Review of Systems  Constitutional:  Negative for chills and fever.  HENT:  Negative for ear pain and sore throat.   Eyes:  Negative for pain and visual disturbance.  Respiratory:  Negative for cough and shortness of breath.   Cardiovascular:  Negative for chest pain and palpitations.  Gastrointestinal:  Negative for abdominal pain and vomiting.  Genitourinary:  Negative for dysuria and hematuria.  Musculoskeletal:  Negative for arthralgias and back pain.  Skin:  Negative for color change and rash.  Neurological:  Positive for numbness. Negative for dizziness, tremors, seizures, syncope, facial asymmetry, speech difficulty, weakness, light-headedness and headaches.  All other systems reviewed and are negative.   Physical Exam Updated Vital Signs BP (!) 171/74   Pulse 66   Temp 98.1 F (36.7 C) (Oral)   Resp 15   Ht 1.651 m ('5\' 5"'$ )  Wt 67 kg   SpO2 100%   BMI 24.58 kg/m  Physical Exam Vitals and nursing note reviewed.  Constitutional:      General: She is not in acute distress.    Appearance: Normal appearance. She is well-developed. She is not ill-appearing or toxic-appearing.  HENT:     Head: Normocephalic and atraumatic.  Eyes:     Extraocular Movements: Extraocular movements intact.     Conjunctiva/sclera: Conjunctivae normal.     Pupils: Pupils are equal, round, and reactive to light.  Cardiovascular:     Rate and Rhythm: Normal rate and regular rhythm.     Heart sounds: No murmur  heard. Pulmonary:     Effort: Pulmonary effort is normal. No respiratory distress.     Breath sounds: Normal breath sounds.  Abdominal:     Palpations: Abdomen is soft.     Tenderness: There is no abdominal tenderness.  Musculoskeletal:        General: Tenderness present. No swelling or deformity.     Cervical back: Normal range of motion and neck supple.     Right lower leg: No edema.     Left lower leg: No edema.     Comments: Dorsalis pedis pulse bilaterally 2+.  Sensation intact to both feet.  No swelling to the feet or the ankle area.  Some mild left calf tenderness.  No effusion or swelling to the knee patella is not dislocated.  Skin:    General: Skin is warm and dry.     Capillary Refill: Capillary refill takes less than 2 seconds.  Neurological:     General: No focal deficit present.     Mental Status: She is alert and oriented to person, place, and time.     Cranial Nerves: No cranial nerve deficit.     Sensory: No sensory deficit.     Motor: No weakness.     Comments: No neurodeficit at all.  Psychiatric:        Mood and Affect: Mood normal.     ED Results / Procedures / Treatments   Labs (all labs ordered are listed, but only abnormal results are displayed) Labs Reviewed  BASIC METABOLIC PANEL - Abnormal; Notable for the following components:      Result Value   Glucose, Bld 105 (*)    Calcium 10.5 (*)    All other components within normal limits  CBG MONITORING, ED - Abnormal; Notable for the following components:   Glucose-Capillary 104 (*)    All other components within normal limits  CBC  URINALYSIS, ROUTINE W REFLEX MICROSCOPIC    EKG EKG Interpretation  Date/Time:  Saturday November 25 2021 17:15:44 EDT Ventricular Rate:  59 PR Interval:  148 QRS Duration: 83 QT Interval:  412 QTC Calculation: 409 R Axis:   61 Text Interpretation: Sinus rhythm Abnormal R-wave progression, early transition Minimal ST elevation, anterior leads No significant change  since last tracing Confirmed by Fredia Sorrow [phone]) on 11/25/2021 5:25:28 PM  Radiology US Venous Img Lower  Left (DVT Study)  Result Date: 11/25/2021 CLINICAL DATA:  Left calf pain EXAM: Left LOWER EXTREMITY VENOUS DOPPLER ULTRASOUND TECHNIQUE: Gray-scale sonography with compression, as well as color and duplex ultrasound, were performed to evaluate the deep venous system(s) from the level of the common femoral vein through the popliteal and proximal calf veins. COMPARISON:  None Available. FINDINGS: VENOUS Normal compressibility of the common femoral, superficial femoral, and popliteal veins, as well as the visualized calf veins. Visualized portions  of profunda femoral vein and great saphenous vein unremarkable. No filling defects to suggest DVT on grayscale or color Doppler imaging. Doppler waveforms show normal direction of venous flow, normal respiratory plasticity and response to augmentation. Limited views of the contralateral common femoral vein are unremarkable. OTHER None. Limitations: none IMPRESSION: There is no evidence of deep venous thrombosis in the left lower extremity. Electronically Signed   By: Elmer Picker M.D.   On: 11/25/2021 18:22   DG Knee Complete 4 Views Left  Result Date: 11/25/2021 CLINICAL DATA:  Calf pain EXAM: LEFT KNEE - COMPLETE 4+ VIEW COMPARISON:  None Available. FINDINGS: No evidence of fracture, dislocation, or joint effusion. No evidence of arthropathy or other focal bone abnormality. Soft tissues are unremarkable. IMPRESSION: Negative. Electronically Signed   By: Fidela Salisbury M.D.   On: 11/25/2021 18:04    Procedures Procedures    Medications Ordered in ED Medications - No data to display  ED Course/ Medical Decision Making/ A&P                           Medical Decision Making Amount and/or Complexity of Data Reviewed Labs: ordered. Radiology: ordered.   Patient's main concern is why the left knee is giving out.  No evidence of effusion  or any kind of deformity.  We will get x-ray of that for further evaluation probably have patient follow-up with her orthopedic group.  Because of the tenderness and the bruise to the back of the left calf we will get Doppler study to rule out DVT.  Although there is no distal leg swelling.  Likely no concerns for any kind of strokelike presentation.  Doppler study without signs of deep vein thrombosis to the left leg.  X-ray of the left knee without any acute abnormalities.  We will give patient knee immobilizer to help provide some stability and have her follow-up with her orthopedic group.  Final Clinical Impression(s) / ED Diagnoses Final diagnoses:  Knee problem  Tenderness of left calf    Rx / DC Orders ED Discharge Orders     None         Fredia Sorrow, MD 11/25/21 1734    Fredia Sorrow, MD 11/25/21 2020

## 2021-11-25 NOTE — ED Triage Notes (Signed)
Pt via pov from home with bruise that she has had for 3 months, increased falls in the last month ("my knee just gives out") and right sided weakness x 2 days. Pt has also had blurred vision x 2 weeks. She saw ophthalmologist and was given eye drops for the same. Pt alert & oriented, nad noted.

## 2021-12-02 ENCOUNTER — Encounter (HOSPITAL_COMMUNITY): Payer: Self-pay

## 2021-12-02 ENCOUNTER — Emergency Department (HOSPITAL_COMMUNITY)
Admission: EM | Admit: 2021-12-02 | Discharge: 2021-12-02 | Disposition: A | Discharge status: Home / Self Care | Payer: Medicare HMO | Attending: Emergency Medicine | Admitting: Emergency Medicine

## 2021-12-02 DIAGNOSIS — R202 Paresthesia of skin: Secondary | ICD-10-CM | POA: Insufficient documentation

## 2021-12-02 DIAGNOSIS — J45909 Unspecified asthma, uncomplicated: Secondary | ICD-10-CM | POA: Insufficient documentation

## 2021-12-02 DIAGNOSIS — I10 Essential (primary) hypertension: Secondary | ICD-10-CM | POA: Diagnosis not present

## 2021-12-02 DIAGNOSIS — Z79899 Other long term (current) drug therapy: Secondary | ICD-10-CM | POA: Insufficient documentation

## 2021-12-02 DIAGNOSIS — R030 Elevated blood-pressure reading, without diagnosis of hypertension: Secondary | ICD-10-CM

## 2021-12-02 LAB — MAGNESIUM: Magnesium: 2.3 mg/dL (ref 1.7–2.4)

## 2021-12-02 LAB — CBC
HCT: 39.8 % (ref 36.0–46.0)
Hemoglobin: 12.7 g/dL (ref 12.0–15.0)
MCH: 28 pg (ref 26.0–34.0)
MCHC: 31.9 g/dL (ref 30.0–36.0)
MCV: 87.7 fL (ref 80.0–100.0)
Platelets: 192 10*3/uL (ref 150–400)
RBC: 4.54 MIL/uL (ref 3.87–5.11)
RDW: 13.4 % (ref 11.5–15.5)
WBC: 5.5 10*3/uL (ref 4.0–10.5)
nRBC: 0 % (ref 0.0–0.2)

## 2021-12-02 LAB — BASIC METABOLIC PANEL
Anion gap: 5 (ref 5–15)
BUN: 10 mg/dL (ref 8–23)
CO2: 26 mmol/L (ref 22–32)
Calcium: 9.5 mg/dL (ref 8.9–10.3)
Chloride: 109 mmol/L (ref 98–111)
Creatinine, Ser: 0.74 mg/dL (ref 0.44–1.00)
GFR, Estimated: >60 mL/min (ref >60)
Glucose, Bld: 103 mg/dL — ABNORMAL HIGH (ref 70–99)
Potassium: 4.6 mmol/L (ref 3.5–5.1)
Sodium: 140 mmol/L (ref 135–145)

## 2021-12-02 MED ORDER — AMLODIPINE BESYLATE 5 MG PO TABS: 5.0000 mg | ORAL_TABLET | Freq: Every day | ORAL | 0 refills | Status: AC

## 2021-12-02 MED ORDER — AMLODIPINE BESYLATE 5 MG PO TABS
5.0000 mg | ORAL_TABLET | Freq: Once | ORAL | Status: AC
  Administered 2021-12-02: 5 mg via ORAL
  Filled 2021-12-02: qty 1

## 2021-12-02 NOTE — ED Triage Notes (Signed)
Pt presents with c/o hypertension. Pt reports that she does not have a hypertension diagnosis and does not take medicine but that she has been seen several times recently for the same and that her blood pressure has been ranging between normal and very elevated. Pt also reports left sided-weakness, no neuro deficits noted. Pt reports weakness present for days, had a workup at urgent care earlier this week but was unable to have a CT scan done at [facility] as they didn't have a scanner.

## 2022-01-05 ENCOUNTER — Other Ambulatory Visit: Payer: Self-pay | Admitting: Nurse Practitioner

## 2022-01-05 DIAGNOSIS — Z1231 Encounter for screening mammogram for malignant neoplasm of breast: Secondary | ICD-10-CM

## 2022-01-09 ENCOUNTER — Other Ambulatory Visit: Payer: Self-pay | Admitting: Nurse Practitioner

## 2022-01-09 DIAGNOSIS — Z780 Asymptomatic menopausal state: Secondary | ICD-10-CM

## 2022-02-21 ENCOUNTER — Ambulatory Visit: Payer: Medicare HMO

## 2023-01-06 ENCOUNTER — Other Ambulatory Visit: Payer: Self-pay

## 2023-01-06 ENCOUNTER — Emergency Department (HOSPITAL_COMMUNITY)
Admission: EM | Admit: 2023-01-06 | Discharge: 2023-01-06 | Disposition: A | Discharge status: Home / Self Care | Payer: Medicare HMO | Attending: Emergency Medicine | Admitting: Emergency Medicine

## 2023-01-06 DIAGNOSIS — T63461A Toxic effect of venom of wasps, accidental (unintentional), initial encounter: Secondary | ICD-10-CM | POA: Diagnosis not present

## 2023-01-06 DIAGNOSIS — T782XXA Anaphylactic shock, unspecified, initial encounter: Secondary | ICD-10-CM | POA: Insufficient documentation

## 2023-01-06 DIAGNOSIS — R21 Rash and other nonspecific skin eruption: Secondary | ICD-10-CM | POA: Diagnosis present

## 2023-01-06 DIAGNOSIS — J45909 Unspecified asthma, uncomplicated: Secondary | ICD-10-CM | POA: Insufficient documentation

## 2023-01-06 MED ORDER — EPINEPHRINE 0.3 MG/0.3ML IJ SOAJ: 0.3000 mg | INTRAMUSCULAR | 0 refills | Status: AC | PRN

## 2023-01-06 MED ORDER — FAMOTIDINE IN NACL 20-0.9 MG/50ML-% IV SOLN
20.0000 mg | Freq: Once | INTRAVENOUS | Status: AC
  Administered 2023-01-06: 20 mg via INTRAVENOUS
  Filled 2023-01-06: qty 50

## 2023-01-06 MED ORDER — PREDNISONE 20 MG PO TABS: 40.0000 mg | ORAL_TABLET | Freq: Every day | ORAL | 0 refills | Status: AC

## 2023-01-06 MED ORDER — METHYLPREDNISOLONE SODIUM SUCC 125 MG IJ SOLR
125.0000 mg | Freq: Once | INTRAMUSCULAR | Status: AC
  Administered 2023-01-06: 125 mg via INTRAVENOUS
  Filled 2023-01-06: qty 2

## 2023-01-06 NOTE — Discharge Instructions (Signed)
Your history, exam, and evaluation are consistent with anaphylactic action to the wasp stings.  We did not see evidence of retained stinger and after the medications your symptoms improved drastically.  You proved stability for several hours and had not had any worsening symptoms.  We feel you are safe for discharge home.  Please fill a prescription for the steroids to start taking tomorrow for the next 5 days and fill the prescription for EpiPen.  If you have any further itching you can take antihistamines.  Please follow-up with your primary doctor and avoid further stings.  If any symptoms change or worsen acutely, please return to the nearest emergency department.

## 2023-01-06 NOTE — ED Triage Notes (Signed)
Patient brought in from home by EMS for allergic reaction. Per EMS patient was stung by wasp from head to toe about 6 times. She denies respiratory distress or respiratory issues. EMS started 20g in L wrist and gave 0.6mg  of Epi and 50mg  of Benadryl. 134/71 107 20 97% RA CBG: 166

## 2023-01-06 NOTE — ED Provider Notes (Signed)
[place] EMERGENCY DEPARTMENT AT [facility] Provider Note   CSN: [id number] Arrival date & time: 01/06/23  1824     History  Chief Complaint  Patient presents with   Allergic Reaction    Wasp    Nicole Delgado is a 72 y.o. female.  The history is provided by the patient and medical records. No language interpreter was used.  Allergic Reaction Presenting symptoms: itching, rash and swelling   Presenting symptoms: no wheezing   Presenting symptoms comment:  Throat closing sensation and facial swelling Itching:    Location:  Full body Rash:    Location:  Full body Swelling:    Location:  Face Duration:  1 hour Prior allergic episodes:  Insect allergies Context: insect bite/sting   Relieved by:  Epinephrine and antihistamines Worsened by:  Nothing Ineffective treatments:  None tried      Home Medications Prior to Admission medications   Medication Sig Start Date End Date Taking? Authorizing Provider  albuterol (PROVENTIL HFA;VENTOLIN HFA) 108 (90 Base) MCG/ACT inhaler Inhale 2 puffs into the lungs every 6 (six) hours as needed for wheezing or shortness of breath. 11/21/16   Tobey Grim, MD  amLODipine (NORVASC) 5 MG tablet Take 1 tablet (5 mg total) by mouth daily for 14 days. 12/02/21 12/16/21  Sloan Leiter, DO  Calcium-Vitamin D 600-5 MG-MCG TABS Take 1 tablet by mouth in the morning and at bedtime.    [provider]  denosumab (PROLIA) 60 MG/ML SOSY injection Inject 60 mg into the skin every 6 (six) months.    [provider]  EPINEPHrine 0.3 mg/0.3 mL IJ SOAJ injection Inject 0.3 mg into the muscle as needed for anaphylaxis. 02/02/21   [provider]  metFORMIN (GLUCOPHAGE) 500 MG tablet Take 500 mg by mouth 2 (two) times daily with a meal.    [provider]      Allergies    Penicillins, Bee venom, and Valium [diazepam]    Review of Systems   Review of Systems  Constitutional:  Negative for chills, fatigue  and fever.  HENT:  Positive for facial swelling. Negative for congestion and rhinorrhea.   Eyes:  Negative for visual disturbance.  Respiratory:  Negative for cough, chest tightness, shortness of breath and wheezing.   Cardiovascular:  Negative for chest pain and palpitations.  Gastrointestinal:  Negative for abdominal pain, constipation, diarrhea, nausea and vomiting.  Genitourinary:  Negative for dysuria.  Musculoskeletal:  Negative for back pain, neck pain and neck stiffness.  Skin:  Positive for itching and rash.  Neurological:  Negative for headaches.  Psychiatric/Behavioral:  Negative for agitation.   All other systems reviewed and are negative.   Physical Exam Updated Vital Signs BP (!) 157/66 (BP Location: Left Arm)   Pulse 96   Temp 97.6 F (36.4 C) (Oral)   Resp 20   SpO2 98%  Physical Exam Vitals and nursing note reviewed.  Constitutional:      General: She is not in acute distress.    Appearance: She is well-developed. She is not toxic-appearing.  HENT:     Head: Atraumatic.     Comments: Facial swelling, lip swelling.  No stridor initially.     Nose: Nose normal. No congestion or rhinorrhea.     Mouth/Throat:     Mouth: Mucous membranes are moist.     Pharynx: No oropharyngeal exudate or posterior oropharyngeal erythema.  Eyes:     Extraocular Movements: Extraocular movements intact.  Conjunctiva/sclera: Conjunctivae normal.     Pupils: Pupils are equal, round, and reactive to light.  Cardiovascular:     Rate and Rhythm: Normal rate and regular rhythm.     Heart sounds: No murmur heard. Pulmonary:     Effort: Pulmonary effort is normal. No respiratory distress.     Breath sounds: Normal breath sounds. No wheezing, rhonchi or rales.  Chest:     Chest wall: No tenderness.  Abdominal:     General: Abdomen is flat.     Palpations: Abdomen is soft.     Tenderness: There is no abdominal tenderness. There is no guarding or rebound.  Musculoskeletal:         General: No swelling or tenderness.     Cervical back: Neck supple.     Right lower leg: No edema.     Left lower leg: No edema.  Skin:    General: Skin is warm and dry.     Capillary Refill: Capillary refill takes less than 2 seconds.     Findings: Erythema and rash present.  Neurological:     General: No focal deficit present.     Mental Status: She is alert.  Psychiatric:        Mood and Affect: Mood normal.     ED Results / Procedures / Treatments   Labs (all labs ordered are listed, but only abnormal results are displayed) Labs Reviewed - No data to display  EKG None  Radiology No results found.  Procedures Procedures    Medications Ordered in ED Medications  methylPREDNISolone sodium succinate (SOLU-MEDROL) 125 mg/2 mL injection 125 mg (125 mg Intravenous Given 01/06/23 1856)  famotidine (PEPCID) IVPB 20 mg premix (20 mg Intravenous New Bag/Given 01/06/23 1901)    ED Course/ Medical Decision Making/ A&P                             Medical Decision Making Risk Prescription drug management.     Nicole Delgado is a 72 y.o. female with a past medical history significant for asthma and osteoporosis who presents for allergic reaction to wasp sting.  According to patient, she was finishing yard work this evening when she was stung approximately 5 times by a wasp.  She quickly started having allergic reaction.  She reports that her face started swelling, felt her throat was swelling, and she had diffuse pruritic rash all over her body.  She denied lightheadedness or syncope and denied any nausea vomiting or wheezing.  She reports did not have shortness of breath but was concerned about the throat swelling sensation.  Epinephrine due to continued facial swelling and throat swelling sensation.  She was then given Benadryl by EMS and brought for evaluation.  She thinks the stings took place around 5 PM and she received the medication shortly thereafter.  She reports that  after the medications symptoms began to improve and now she started to feel better.  She reports diffuse itching and rash is also started to improve.  She currently denies any chest pain, shortness of breath, nausea, vomiting, palpitations.  She reports has been years since the last time she had a allergic action like this.  On exam, lungs were clear.  No wheezing.  Chest nontender.  Abdomen nontender.  No evidence of retained stinger in her right buttock that she was concerned about.  This was examined with a chaperone.  Otherwise no stridor.  Facial swelling and  lip swelling is present but patient subjectively feels that is improving.  She has urticarial rash diffusely.  Exam otherwise unremarkable.  We had a shared decision-making conversation and agreed to give her Pepcid for other antihistamine coverage and give steroids.  We will watch her for approximately 6 hours after her epi which should be around 11 PM.  Patient agrees that if she is feeling better and it does not worsen, will discharge home with burst of steroids and instructions take antihistamines and get prescription for EpiPen.  7:57 PM Patient just reassessed and her symptoms are improving.  She reports the swelling of her face has significantly improved and the rash is doing better.  Still mild itching but she is feeling much better.  If she continues to improve, anticipate discharge at around 11 PM with prescriptions.  11:00 PM Patient continues to have improving symptoms.  She would like to go home now.  No further wheezing on reassessment and swelling has significantly improved.  Patient will go home with prescription for prednisone and EpiPen.  Patient agreed with plan of care and was discharged in good condition.          Final Clinical Impression(s) / ED Diagnoses Final diagnoses:  Anaphylaxis, initial encounter  Wasp sting, accidental or unintentional, initial encounter    Rx / DC Orders ED Discharge Orders           Ordered    predniSONE (DELTASONE) 20 MG tablet  Daily with breakfast        01/06/23 2256    EPINEPHrine 0.3 mg/0.3 mL IJ SOAJ injection  As needed        01/06/23 2256           Clinical Impression: 1. Anaphylaxis, initial encounter   2. Wasp sting, accidental or unintentional, initial encounter     Disposition: Discharge  Condition: Good  I have discussed the results, Dx and Tx plan with the pt(& family if present). He/she/they expressed understanding and agree(s) with the plan. Discharge instructions discussed at great length. Strict return precautions discussed and pt &/or family have verbalized understanding of the instructions. No further questions at time of discharge.    New Prescriptions   EPINEPHRINE 0.3 MG/0.3 ML IJ SOAJ INJECTION    Inject 0.3 mg into the muscle as needed for anaphylaxis.   PREDNISONE (DELTASONE) 20 MG TABLET    Take 2 tablets (40 mg total) by mouth daily with breakfast for 5 days.    Follow Up: Estevan Oaks, NP [address] [place] [address] [phone]     [facility] Emergency Department at [facility] [address] [place] [address] [phone]       Mette Southgate, Canary Brim, MD 01/06/23 2300

## 2023-01-06 NOTE — ED Notes (Addendum)
Pt educated on DC instructions and verbalizes understanding, pt taken home by family, pt RR even and unlabored at time of DC, pt ambulatory to ED Lobby

## 2023-05-01 ENCOUNTER — Ambulatory Visit (HOSPITAL_BASED_OUTPATIENT_CLINIC_OR_DEPARTMENT_OTHER)
Admission: RE | Admit: 2023-05-01 | Discharge: 2023-05-01 | Disposition: A | Discharge status: Home / Self Care | Payer: 59 | Source: Ambulatory Visit | Attending: Nurse Practitioner | Admitting: Nurse Practitioner

## 2023-05-01 ENCOUNTER — Other Ambulatory Visit (HOSPITAL_COMMUNITY): Payer: Self-pay | Admitting: Nurse Practitioner

## 2023-05-01 DIAGNOSIS — M25571 Pain in right ankle and joints of right foot: Secondary | ICD-10-CM | POA: Diagnosis present

## 2023-06-07 ENCOUNTER — Emergency Department (HOSPITAL_BASED_OUTPATIENT_CLINIC_OR_DEPARTMENT_OTHER): Payer: 59

## 2023-06-07 ENCOUNTER — Emergency Department (HOSPITAL_BASED_OUTPATIENT_CLINIC_OR_DEPARTMENT_OTHER)
Admission: EM | Admit: 2023-06-07 | Discharge: 2023-06-07 | Disposition: A | Discharge status: Home / Self Care | Payer: 59 | Attending: Emergency Medicine | Admitting: Emergency Medicine

## 2023-06-07 ENCOUNTER — Other Ambulatory Visit: Payer: Self-pay

## 2023-06-07 ENCOUNTER — Encounter (HOSPITAL_BASED_OUTPATIENT_CLINIC_OR_DEPARTMENT_OTHER): Payer: Self-pay | Admitting: Emergency Medicine

## 2023-06-07 DIAGNOSIS — S6991XA Unspecified injury of right wrist, hand and finger(s), initial encounter: Secondary | ICD-10-CM | POA: Diagnosis present

## 2023-06-07 DIAGNOSIS — W228XXA Striking against or struck by other objects, initial encounter: Secondary | ICD-10-CM | POA: Diagnosis not present

## 2023-06-07 DIAGNOSIS — M7989 Other specified soft tissue disorders: Secondary | ICD-10-CM | POA: Insufficient documentation

## 2023-06-07 DIAGNOSIS — S62366A Nondisplaced fracture of neck of fifth metacarpal bone, right hand, initial encounter for closed fracture: Secondary | ICD-10-CM | POA: Diagnosis not present

## 2023-06-07 DIAGNOSIS — S62308A Unspecified fracture of other metacarpal bone, initial encounter for closed fracture: Secondary | ICD-10-CM

## 2023-06-07 MED ORDER — NAPROXEN 250 MG PO TABS
375.0000 mg | ORAL_TABLET | Freq: Once | ORAL | Status: AC
  Administered 2023-06-07: 375 mg via ORAL
  Filled 2023-06-07: qty 2

## 2023-06-07 MED ORDER — LIDOCAINE 5 % EX PTCH
1.0000 | MEDICATED_PATCH | CUTANEOUS | Status: DC
  Administered 2023-06-07: 1 via TRANSDERMAL
  Filled 2023-06-07: qty 1

## 2023-06-07 NOTE — ED Triage Notes (Signed)
C/o R hand pain from hitting it over something 2 days ago. Posterior hand appears swollen.

## 2023-06-07 NOTE — ED Notes (Signed)
Error- splint applied to R wrist, not L

## 2023-06-07 NOTE — Discharge Instructions (Signed)
Thank you for letting me evaluate you today.  Your imaging was significant for fracture of your left finger.  Hand surgeon recommends ulnar gutter splint and for you to follow-up with them outpatient for further management.  I have sent naproxen to your pharmacy for pain to use as needed.  You may also use over-the-counter Tylenol or lidocaine patches as needed for pain

## 2023-06-07 NOTE — ED Provider Notes (Incomplete)
[place] EMERGENCY DEPARTMENT AT [facility] Provider Note   CSN: [id number] Arrival date & time: 06/07/23  1141     History {Add pertinent medical, surgical, social history, OB history to HPI:1} Chief Complaint  Patient presents with  . Hand Injury    Nicole Delgado is a 72 y.o. female with a past medical history of OP, hepatic adenoma, R radius distal fracture presents emergency department for evaluation of right hand pain following hitting it on her dryer 3 to 4 days ago.  She reports that she had sneakers in the dryer then heard a strange noise.  The shoes laces were caught in the dryer so she had to forcefully pull them out when she caught the right side of her hand on the bottom of the dryer opening.  Yesterday she started noticed swelling and pain to the fourth and fifth finger and MCPs. She used ibuprofen without much relief. She denies additional injuries, head injury.   Hand Injury Associated symptoms: no fatigue and no fever      Home Medications Prior to Admission medications   Medication Sig Start Date End Date Taking? Authorizing Provider  albuterol (PROVENTIL HFA;VENTOLIN HFA) 108 (90 Base) MCG/ACT inhaler Inhale 2 puffs into the lungs every 6 (six) hours as needed for wheezing or shortness of breath. 11/21/16   Tobey Grim, MD  alendronate (FOSAMAX) 70 MG tablet Take 70 mg by mouth once a week. Mondays 11/21/22   [provider]  amLODipine (NORVASC) 5 MG tablet Take 1 tablet (5 mg total) by mouth daily for 14 days. 12/02/21 01/06/23  Sloan Leiter, DO  atorvastatin (LIPITOR) 20 MG tablet Take 20 mg by mouth daily.    [provider]  Calcium-Vitamin D 600-5 MG-MCG TABS Take 1 tablet by mouth in the morning and at bedtime.    [provider]  EPINEPHrine 0.3 mg/0.3 mL IJ SOAJ injection Inject 0.3 mg into the muscle daily as needed for anaphylaxis. 02/02/21   [provider]  EPINEPHrine 0.3 mg/0.3 mL IJ SOAJ injection  Inject 0.3 mg into the muscle as needed for anaphylaxis. 01/06/23   Tegeler, Canary Brim, MD  metFORMIN (GLUCOPHAGE) 500 MG tablet Take 500 mg by mouth 2 (two) times daily with a meal.    [provider]  Multiple Vitamin (MULTIVITAMIN WITH MINERALS) TABS tablet Take 1 tablet by mouth daily.    [provider]  traZODone (DESYREL) 50 MG tablet Take 50 mg by mouth at bedtime as needed for sleep.    [provider]      Allergies    Penicillins, Bee venom, and Valium [diazepam]    Review of Systems   Review of Systems  Constitutional:  Negative for chills, fatigue and fever.  Respiratory:  Negative for cough, chest tightness, shortness of breath and wheezing.   Cardiovascular:  Negative for chest pain and palpitations.  Gastrointestinal:  Negative for abdominal pain, constipation, diarrhea, nausea and vomiting.  Musculoskeletal:  Positive for arthralgias.  Neurological:  Negative for dizziness, seizures, weakness, light-headedness, numbness and headaches.    Physical Exam Updated Vital Signs BP 130/78   Pulse (!) 104   Temp 97.8 F (36.6 C)   Resp 18   Ht 5\' 4"  (1.626 m)   Wt 62.6 kg   SpO2 99%   BMI 23.69 kg/m  Physical Exam Vitals and nursing note reviewed.  Constitutional:      General: She is not in acute distress.    Appearance: Normal  appearance. She is not ill-appearing.  HENT:     Head: Normocephalic and atraumatic.     Right Ear: Tympanic membrane, ear canal and external ear normal.     Left Ear: Tympanic membrane, ear canal and external ear normal.     Mouth/Throat:     Mouth: Mucous membranes are moist.     Pharynx: No oropharyngeal exudate or posterior oropharyngeal erythema.     Comments: Uvula midline. No abscess, fluctuance, erythema noted in mouth Eyes:     General: No scleral icterus.       Right eye: No discharge.        Left eye: No discharge.     Conjunctiva/sclera: Conjunctivae normal.  Cardiovascular:     Rate and  Rhythm: Tachycardia present.     Pulses: Normal pulses.     Heart sounds: Murmur: 104bpm.  Pulmonary:     Effort: Pulmonary effort is normal. No respiratory distress.     Breath sounds: Normal breath sounds. No stridor. No wheezing or rhonchi.  Chest:     Chest wall: No tenderness.  Musculoskeletal:     Cervical back: Normal range of motion and neck supple. No rigidity or tenderness.  Lymphadenopathy:     Cervical: No cervical adenopathy.  Skin:    General: Skin is warm.     Capillary Refill: Capillary refill takes less than 2 seconds.     Coloration: Skin is not jaundiced or pale.  Neurological:     Mental Status: She is alert and oriented to person, place, and time. Mental status is at baseline.   ED Results / Procedures / Treatments   Labs (all labs ordered are listed, but only abnormal results are displayed) Labs Reviewed - No data to display  EKG None  Radiology No results found.  Procedures Procedures  {Document cardiac monitor, telemetry assessment procedure when appropriate:1}  Medications Ordered in ED Medications  naproxen (NAPROSYN) tablet 375 mg (has no administration in time range)  lidocaine (LIDODERM) 5 % 1 patch (has no administration in time range)    ED Course/ Medical Decision Making/ A&P   {   Click here for ABCD2, HEART and other calculatorsREFRESH Note before signing :1}                              Medical Decision Making Amount and/or Complexity of Data Reviewed Radiology: ordered.  Risk Prescription drug management.   Patient presents to the ED for concern of hand pain, this involves an extensive number of treatment options, and is a complaint that carries with it a high risk of complications and morbidity.  The differential diagnosis includes fracture, ligamentous injury, dislocation   Co morbidities that complicate the patient evaluation  OP, R radius distal fracture   Additional history obtained:  Additional history obtained  from Nursing and Outside Medical Records   External records from outside source obtained and reviewed including  Triage RN note Recent medical evaluation and medication list    Imaging Studies ordered:  I ordered imaging studies including XR hand  I independently visualized and interpreted imaging which showed acute nondisplaced fracture of the fifth metacarpal neck and distal diaphysis I agree with the radiologist interpretation    Medicines ordered and prescription drug management:  I ordered medication including naprosyn and lido patch for pain  Reevaluation of the patient after these medicines showed that the patient improved I have reviewed the patients home medicines and have  made adjustments as needed    Problem List / ED Course:  Right hand pain TTP of dorsal portion of right hand at 4th and 5th metacarpals. Mild difficulty making complete fist due to pain. Medial, radial, ulnar nerves intact. Radial pulses 2+ bilaterally X-ray significant for acute nondisplaced fracture of the fifth metacarpal neck and distal diastasis Dr. Merlyn Lot recommends ulnar gutter splint and outpatient follow-up Will provide analgesia prescription Discussed return to emergency department cautions with patient expresses understanding agrees with plan.  All questions answered to her satisfaction.  She is agreeable to discharge at this time.   Reevaluation:  After the interventions noted above, I reevaluated the patient and found that they have :improved   Social Determinants of Health:  Has PCP and Ortho follow-up   Dispostion:  After consideration of the diagnostic results and the patients response to treatment, I feel that the patent would benefit from outpatient management and orthopedic follow-up.   Final Clinical Impression(s) / ED Diagnoses Final diagnoses:  None    Rx / DC Orders ED Discharge Orders     None

## 2023-11-06 ENCOUNTER — Other Ambulatory Visit: Payer: Self-pay

## 2023-11-06 ENCOUNTER — Encounter (HOSPITAL_BASED_OUTPATIENT_CLINIC_OR_DEPARTMENT_OTHER): Payer: Self-pay

## 2023-11-06 ENCOUNTER — Emergency Department (HOSPITAL_BASED_OUTPATIENT_CLINIC_OR_DEPARTMENT_OTHER)

## 2023-11-06 ENCOUNTER — Emergency Department (HOSPITAL_BASED_OUTPATIENT_CLINIC_OR_DEPARTMENT_OTHER)
Admission: EM | Admit: 2023-11-06 | Discharge: 2023-11-06 | Disposition: A | Discharge status: Home / Self Care | Attending: Emergency Medicine | Admitting: Emergency Medicine

## 2023-11-06 DIAGNOSIS — R519 Headache, unspecified: Secondary | ICD-10-CM | POA: Diagnosis present

## 2023-11-06 DIAGNOSIS — M79602 Pain in left arm: Secondary | ICD-10-CM | POA: Insufficient documentation

## 2023-11-06 LAB — URINALYSIS, ROUTINE W REFLEX MICROSCOPIC
Bacteria, UA: NONE SEEN
Bilirubin Urine: NEGATIVE
Glucose, UA: NEGATIVE mg/dL
Hgb urine dipstick: NEGATIVE
Ketones, ur: NEGATIVE mg/dL
Leukocytes,Ua: NEGATIVE
Nitrite: NEGATIVE
Protein, ur: NEGATIVE mg/dL
Specific Gravity, Urine: 1.006 (ref 1.005–1.030)
pH: 6.5 (ref 5.0–8.0)

## 2023-11-06 LAB — CBC WITH DIFFERENTIAL/PLATELET
Abs Immature Granulocytes: 0.01 10*3/uL (ref 0.00–0.07)
Basophils Absolute: 0 10*3/uL (ref 0.0–0.1)
Basophils Relative: 0 %
Eosinophils Absolute: 0.3 10*3/uL (ref 0.0–0.5)
Eosinophils Relative: 5 %
HCT: 42.1 % (ref 36.0–46.0)
Hemoglobin: 13.3 g/dL (ref 12.0–15.0)
Immature Granulocytes: 0 %
Lymphocytes Relative: 17 %
Lymphs Abs: 1.1 10*3/uL (ref 0.7–4.0)
MCH: 27.8 pg (ref 26.0–34.0)
MCHC: 31.6 g/dL (ref 30.0–36.0)
MCV: 87.9 fL (ref 80.0–100.0)
Monocytes Absolute: 0.3 10*3/uL (ref 0.1–1.0)
Monocytes Relative: 5 %
Neutro Abs: 4.7 10*3/uL (ref 1.7–7.7)
Neutrophils Relative %: 73 %
Platelets: 172 10*3/uL (ref 150–400)
RBC: 4.79 MIL/uL (ref 3.87–5.11)
RDW: 12.7 % (ref 11.5–15.5)
WBC: 6.5 10*3/uL (ref 4.0–10.5)
nRBC: 0 % (ref 0.0–0.2)

## 2023-11-06 LAB — BASIC METABOLIC PANEL WITH GFR
Anion gap: 10 (ref 5–15)
BUN: 10 mg/dL (ref 8–23)
CO2: 28 mmol/L (ref 22–32)
Calcium: 9.5 mg/dL (ref 8.9–10.3)
Chloride: 102 mmol/L (ref 98–111)
Creatinine, Ser: 0.83 mg/dL (ref 0.44–1.00)
GFR, Estimated: >60 mL/min (ref >60)
Glucose, Bld: 130 mg/dL — ABNORMAL HIGH (ref 70–99)
Potassium: 4.7 mmol/L (ref 3.5–5.1)
Sodium: 140 mmol/L (ref 135–145)

## 2023-11-06 LAB — TROPONIN T, HIGH SENSITIVITY: Troponin T High Sensitivity: <15 ng/L (ref <19)

## 2023-11-06 LAB — HEPATIC FUNCTION PANEL
ALT: 17 U/L (ref 0–44)
AST: 25 U/L (ref 15–41)
Albumin: 3.9 g/dL (ref 3.5–5.0)
Alkaline Phosphatase: 69 U/L (ref 38–126)
Bilirubin, Direct: 0.2 mg/dL (ref 0.0–0.2)
Indirect Bilirubin: 0.3 mg/dL (ref 0.3–0.9)
Total Bilirubin: 0.4 mg/dL (ref 0.0–1.2)
Total Protein: 6.8 g/dL (ref 6.5–8.1)

## 2023-11-06 LAB — CBG MONITORING, ED: Glucose-Capillary: 148 mg/dL — ABNORMAL HIGH (ref 70–99)

## 2023-11-06 MED ORDER — IOHEXOL 350 MG/ML SOLN
100.0000 mL | Freq: Once | INTRAVENOUS | Status: AC | PRN
  Administered 2023-11-06: 100 mL via INTRAVENOUS

## 2023-11-06 MED ORDER — ONDANSETRON 4 MG PO TBDP: 4.0000 mg | ORAL_TABLET | Freq: Three times a day (TID) | ORAL | 0 refills | Status: DC | PRN

## 2023-11-06 MED ORDER — SODIUM CHLORIDE 0.9 % IV BOLUS
500.0000 mL | Freq: Once | INTRAVENOUS | Status: AC
  Administered 2023-11-06: 500 mL via INTRAVENOUS

## 2023-11-06 MED ORDER — ONDANSETRON HCL 4 MG/2ML IJ SOLN
4.0000 mg | Freq: Once | INTRAMUSCULAR | Status: AC
  Administered 2023-11-06: 4 mg via INTRAVENOUS
  Filled 2023-11-06: qty 2

## 2023-11-06 NOTE — ED Notes (Signed)
Patient ambulated to restroom with no assistance needed.

## 2023-11-06 NOTE — Discharge Instructions (Signed)
 Shoulder rolls, warm/moist heat to shoulders as discussed.  Lidocaine  patch topically. Follow up with your PCP for recheck. Return to ER for worsening or concerning symptoms.

## 2023-11-06 NOTE — ED Notes (Signed)
 Patient transported to CT

## 2023-11-06 NOTE — ED Notes (Addendum)
 Pt ambulated with assistance to restroom. Denies numbness at this time

## 2023-11-06 NOTE — ED Provider Notes (Signed)
 [place] [facility] AT [facility] Provider Note   CSN: [id number] Arrival date & time: 11/06/23  1056     History  Chief Complaint  Patient presents with   Numbness    Nicole Delgado is a 73 y.o. female.  73 year old female presents with complaint of headache, left arm discomfort and nausea/vomiting. Patient states she flew to [place] over the weekend for a family gathering. On Saturday noted a lightening type headache and that she had urinary urgency but little urine output. Patient thought her symptoms were related to not drinking enough water so she drank water and felt better initially. She continued to get lightening type/electrical pain in her head and developed the nausea/vomiting last night, woke up with left arm numbness/heaviness and came to the ER. States at this time her symptoms have somewhat improved, she is a little nauseous with a mild headache, arm symptoms have resolved. Denies recent illness, falls, unilateral weakness, changes in vision/speech/gait. No other complaints or concerns.        Home Medications Prior to Admission medications   Medication Sig Start Date End Date Taking? Authorizing Provider  ondansetron  (ZOFRAN -ODT) 4 MG disintegrating tablet Take 1 tablet (4 mg total) by mouth every 8 (eight) hours as needed for nausea or vomiting. 11/06/23  Yes Darlis Eisenmenger, PA-C  albuterol  (PROVENTIL  HFA;VENTOLIN  HFA) 108 (90 Base) MCG/ACT inhaler Inhale 2 puffs into the lungs every 6 (six) hours as needed for wheezing or shortness of breath. 11/21/16   Trenton Frock, MD  alendronate  (FOSAMAX ) 70 MG tablet Take 70 mg by mouth once a week. Mondays 11/21/22   [provider]  amLODipine  (NORVASC ) 5 MG tablet Take 1 tablet (5 mg total) by mouth daily for 14 days. 12/02/21 01/06/23  Teddi Favors, DO  atorvastatin  (LIPITOR) 20 MG tablet Take 20 mg by mouth daily.    [provider]  Calcium -Vitamin D  600-5 MG-MCG TABS Take 1 tablet by  mouth in the morning and at bedtime.    [provider]  EPINEPHrine  0.3 mg/0.3 mL IJ SOAJ injection Inject 0.3 mg into the muscle daily as needed for anaphylaxis. 02/02/21   [provider]  EPINEPHrine  0.3 mg/0.3 mL IJ SOAJ injection Inject 0.3 mg into the muscle as needed for anaphylaxis. 01/06/23   Tegeler, Marine Sia, MD  metFORMIN  (GLUCOPHAGE ) 500 MG tablet Take 500 mg by mouth 2 (two) times daily with a meal.    [provider]  Multiple Vitamin (MULTIVITAMIN WITH MINERALS) TABS tablet Take 1 tablet by mouth daily.    [provider]  traZODone  (DESYREL ) 50 MG tablet Take 50 mg by mouth at bedtime as needed for sleep.    [provider]      Allergies    Penicillins, Bee venom, and Valium [diazepam]    Review of Systems   Review of Systems Negaitve except as per HPI Physical Exam Updated Vital Signs BP 137/74   Pulse 67   Temp 98.7 F (37.1 C) (Oral)   Resp 19   Ht 5\' 6"  (1.676 m)   Wt 69.4 kg   SpO2 94%   BMI 24.69 kg/m  Physical Exam Vitals and nursing note reviewed.  Constitutional:      General: She is not in acute distress.    Appearance: She is well-developed. She is not diaphoretic.  HENT:     Head: Normocephalic and atraumatic.     Mouth/Throat:     Mouth: Mucous membranes are  moist.  Eyes:     General: No visual field deficit.    Extraocular Movements: Extraocular movements intact.     Pupils: Pupils are equal, round, and reactive to light.  Cardiovascular:     Pulses: Normal pulses.  Pulmonary:     Effort: Pulmonary effort is normal.  Abdominal:     Palpations: Abdomen is soft.     Tenderness: There is no abdominal tenderness.  Musculoskeletal:     Cervical back: Neck supple.       Back:     Right lower leg: No edema.     Left lower leg: No edema.  Skin:    General: Skin is warm and dry.     Findings: No erythema or rash.  Neurological:     Mental Status: She is alert and oriented to person, place,  and time.     GCS: GCS eye subscore is 4. GCS verbal subscore is 5. GCS motor subscore is 6.     Cranial Nerves: Cranial nerves 2-12 are intact. No cranial nerve deficit, dysarthria or facial asymmetry.     Sensory: Sensation is intact.     Motor: Motor function is intact. No weakness, tremor or pronator drift.     Coordination: Finger-Nose-Finger Test and Heel to [employer] normal.  Psychiatric:        Behavior: Behavior normal.     ED Results / Procedures / Treatments   Labs (all labs ordered are listed, but only abnormal results are displayed) Labs Reviewed  BASIC METABOLIC PANEL WITH GFR - Abnormal; Notable for the following components:      Result Value   Glucose, Bld 130 (*)    All other components within normal limits  URINALYSIS, ROUTINE W REFLEX MICROSCOPIC - Abnormal; Notable for the following components:   Color, Urine COLORLESS (*)    All other components within normal limits  CBG MONITORING, ED - Abnormal; Notable for the following components:   Glucose-Capillary 148 (*)    All other components within normal limits  CBC WITH DIFFERENTIAL/PLATELET  HEPATIC FUNCTION PANEL  TROPONIN T, HIGH SENSITIVITY    EKG EKG Interpretation Date/Time:  Wednesday Nov 06 2023 13:24:46 EDT Ventricular Rate:  67 PR Interval:  147 QRS Duration:  87 QT Interval:  395 QTC Calculation: 417 R Axis:   64  Text Interpretation: Sinus rhythm Consider left atrial enlargement Confirmed by Lowery Rue [phone]) on 11/06/2023 2:56:39 PM  Radiology CT Angio Head Neck W WO CM Result Date: 11/06/2023 CLINICAL DATA:  Neuro deficit, acute, stroke suspected. Left hand numbness, vomiting, and electrical shock sensation in the head. EXAM: CT ANGIOGRAPHY HEAD AND NECK WITH AND WITHOUT CONTRAST TECHNIQUE: Multidetector CT imaging of the head and neck was performed using the standard protocol during bolus administration of intravenous contrast. Multiplanar CT image reconstructions and MIPs were obtained  to evaluate the vascular anatomy. Carotid stenosis measurements (when applicable) are obtained utilizing NASCET criteria, using the distal internal carotid diameter as the denominator. RADIATION DOSE REDUCTION: This exam was performed according to the departmental dose-optimization program which includes automated exposure control, adjustment of the mA and/or kV according to patient size and/or use of iterative reconstruction technique. CONTRAST:  OMNIPAQUE  IOHEXOL  350 MG/ML SOLN COMPARISON:  Head CT 10/30/2020 FINDINGS: CT HEAD FINDINGS Brain: There is no evidence of an acute infarct, intracranial hemorrhage, mass, midline shift, or extra-axial fluid collection. Cerebral volume is normal. The ventricles are normal in size. Vascular: Reported below. Skull: No fracture or suspicious lesion.  Sinuses/Orbits: Paranasal sinuses and mastoid air cells are clear. Bilateral cataract extraction. Other: None. Review of the MIP images confirms the above findings CTA NECK FINDINGS Aortic arch: Standard branching with mild atherosclerotic calcification. No significant stenosis of the arch vessel origins. Right carotid system: Patent with minimal calcified plaque at the carotid bifurcation. No evidence of a significant stenosis or dissection. Left carotid system: Patent with minimal calcified and soft plaque at the carotid bifurcation. No evidence of a significant stenosis or dissection. Vertebral arteries: Patent with the left being strongly dominant. No evidence of a significant stenosis or dissection. Skeleton: Focally advanced disc degeneration at C5-6. Advanced facet arthrosis on the left at C2-3 and on the right at C3-4 and C4-5. Other neck: No evidence of cervical lymphadenopathy or mass. Upper chest: Mild biapical lung scarring. Review of the MIP images confirms the above findings CTA HEAD FINDINGS Anterior circulation: The internal carotid arteries are widely patent from skull base to carotid termini. ACAs and MCAs  are patent without evidence of a proximal branch occlusion or significant proximal stenosis. No aneurysm is identified. Posterior circulation: The intracranial vertebral arteries are patent to the basilar with the right being particularly diminutive distal to the PICA origin. Patent PICA, AICA, and SCA origins are visualized bilaterally. The basilar artery is widely patent. Posterior communicating arteries are small or absent. Both PCAs are patent without evidence a significant proximal stenosis. No aneurysm is identified. Venous sinuses: Patent. Anatomic variants: Duplicated left MCA. Review of the MIP images confirms the above findings IMPRESSION: 1. No evidence of acute intracranial abnormality. 2. No large vessel occlusion or significant stenosis in the head or neck. 3.  Aortic Atherosclerosis (ICD10-I70.0). Electronically Signed   By: Aundra Lee M.D.   On: 11/06/2023 15:58    Procedures Procedures    Medications Ordered in ED Medications  ondansetron  (ZOFRAN ) injection 4 mg (4 mg Intravenous Given 11/06/23 1355)  sodium chloride  0.9 % bolus 500 mL ( Intravenous Stopped 11/06/23 1523)  iohexol (OMNIPAQUE) 350 MG/ML injection 100 mL (100 mLs Intravenous Contrast Given 11/06/23 1420)    ED Course/ Medical Decision Making/ A&P                                 Medical Decision Making Amount and/or Complexity of Data Reviewed Labs: ordered. Radiology: ordered.  Risk Prescription drug management.   This patient presents to the ED for concern of headache, arm numbness, vomiting, this involves an extensive number of treatment options, and is a complaint that carries with it a high risk of complications and morbidity.  The differential diagnosis includes migraine, CVA, mass, ACS, cervical radiculopathy, viral illness, UTI   Co morbidities / Chronic conditions that complicate the patient evaluation  Osteoporosis, asthma, pre-diabetes, headaches, cataracts    Additional history  obtained:  Additional history obtained from EMR External records from outside source obtained and reviewed including prior head CT 10/30/20 normal   Lab Tests:  I Ordered, and personally interpreted labs.  The pertinent results include:  CBC WNL. BMP with elevated glucose at 130, not in DKA. Hepatic fxn normal. Troponin less than 15, negative. UA unremarkable   Imaging Studies ordered:  I ordered imaging studies including CT angio head and neck I independently visualized and interpreted imaging which showed no acute process I agree with the radiologist interpretation   Cardiac Monitoring: / EKG:  The patient was maintained on a cardiac monitor.  I personally  viewed and interpreted the cardiac monitored which showed an underlying rhythm of: Sinus rhythm, rate 67   Problem List / ED Course / Critical interventions / Medication management  73 year old female presents with complaint of headache, urinary urgency, left arm pain, nausea as above.  Neuroexam is unremarkable.  Remaining physical without significant findings.  Workup today is largely reassuring including labs, EKG, CT angio head and neck.  Patient symptoms have improved with IV fluids and Zofran .  She request prescription for Zofran .  Plan is to follow-up with PCP for recheck.  Return to ER for worsening or concerning symptoms. I ordered medication including Zofran  for nausea Reevaluation of the patient after these medicines showed that the patient improved, symptoms resolved I have reviewed the patients home medicines and have made adjustments as needed   Social Determinants of Health:  Lives alone, has PCP   Test / Admission - Considered:  Stable for discharge with plan to recheck with PCP.         Final Clinical Impression(s) / ED Diagnoses Final diagnoses:  Nonintractable episodic headache, unspecified headache type  Pain of left upper extremity    Rx / DC Orders ED Discharge Orders          Ordered     ondansetron  (ZOFRAN -ODT) 4 MG disintegrating tablet  Every 8 hours PRN        11/06/23 1621              Darlis Eisenmenger, PA-C 11/06/23 1720    Rosealee Concha, MD 11/06/23 2052

## 2023-11-06 NOTE — ED Triage Notes (Addendum)
 Pt arrives with c/o vomiting and 'elctric shock felling in head starting on Friday' reports waking up today with left hand numbness. States that the numbness has gotten better since waking with some numbness continuing. Pt went to bed around 9 pm last night. When touching pt she denies any difference in sensation to touch on left or right.

## 2023-11-06 NOTE — ED Notes (Signed)
 RN reviewed discharge instructions with pt. Pt verbalized understanding and had no further questions. VSS upon discharge.

## 2024-06-20 ENCOUNTER — Encounter: Payer: Self-pay | Admitting: Gastroenterology

## 2024-06-26 ENCOUNTER — Encounter: Payer: Self-pay | Admitting: Gastroenterology

## 2024-07-15 ENCOUNTER — Ambulatory Visit

## 2024-07-15 VITALS — Ht 66.0 in | Wt 164.0 lb

## 2024-07-15 DIAGNOSIS — Z860100 Personal history of colon polyps, unspecified: Secondary | ICD-10-CM

## 2024-07-15 NOTE — Progress Notes (Signed)
 RN confirmed patient name, date of birth, and address RN confirmed date and time of procedure RN reviewed and confirmed allergies  RN reviewed and updated current medications; confirmed preferred pharmacy Pt is not on diet pills nor GLP-1 medications Pt is not on blood thinners RN reviewed medical & surgical hx  Pt denies issues with chronic constipation  Diabetic - no No A fib or A flutter No cardiac tests are pending  Pt is not on home 02  No issues known with past sedation with any surgeries or procedures Patient denies ever being told they had issues or difficulty with intubation  Patient unaware of any fh of malignant hyperthermia Ambulates independently RN reviewed prep instructions and explained time frames for holding certain medications RN answered patient questions; patient stated understanding Prep instructions printed and reviewed with patient

## 2024-07-30 ENCOUNTER — Encounter: Admitting: Gastroenterology
